# Patient Record
Sex: Female | Born: 1935 | Race: White | Hispanic: No | Smoking: Never smoker
Health system: Southern US, Community
[De-identification: ages and names within clinical notes are randomized; demographics above are authoritative.]

## PROBLEM LIST (undated history)

## (undated) DIAGNOSIS — K222 Esophageal obstruction: Secondary | ICD-10-CM

## (undated) DIAGNOSIS — K449 Diaphragmatic hernia without obstruction or gangrene: Secondary | ICD-10-CM

## (undated) DIAGNOSIS — I10 Essential (primary) hypertension: Secondary | ICD-10-CM

## (undated) DIAGNOSIS — M4850XA Collapsed vertebra, not elsewhere classified, site unspecified, initial encounter for fracture: Secondary | ICD-10-CM

## (undated) DIAGNOSIS — S72143A Displaced intertrochanteric fracture of unspecified femur, initial encounter for closed fracture: Secondary | ICD-10-CM

## (undated) DIAGNOSIS — E039 Hypothyroidism, unspecified: Secondary | ICD-10-CM

## (undated) DIAGNOSIS — R0602 Shortness of breath: Secondary | ICD-10-CM

## (undated) DIAGNOSIS — G629 Polyneuropathy, unspecified: Secondary | ICD-10-CM

## (undated) DIAGNOSIS — M1990 Unspecified osteoarthritis, unspecified site: Secondary | ICD-10-CM

## (undated) HISTORY — DX: Diaphragmatic hernia without obstruction or gangrene: K44.9

## (undated) HISTORY — PX: HIP FRACTURE SURGERY: SHX118

## (undated) HISTORY — DX: Esophageal obstruction: K22.2

## (undated) HISTORY — DX: Polyneuropathy, unspecified: G62.9

## (undated) HISTORY — PX: SHOULDER SURGERY: SHX246

## (undated) HISTORY — PX: COLONOSCOPY: SHX174

## (undated) HISTORY — DX: Hypothyroidism, unspecified: E03.9

---
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
## 1965-06-13 HISTORY — PX: CHOLECYSTECTOMY: SHX55

## 1975-06-14 HISTORY — PX: ABDOMINAL HYSTERECTOMY: SHX81

## 1984-06-13 HISTORY — PX: SPINE SURGERY: SHX786

## 1993-06-13 HISTORY — PX: ESOPHAGOGASTRODUODENOSCOPY: SHX1529

## 1993-06-13 HISTORY — PX: ERCP: SHX60

## 1997-06-13 HISTORY — PX: HEMORRHOID SURGERY: SHX153

## 2000-03-07 ENCOUNTER — Ambulatory Visit (HOSPITAL_COMMUNITY): Admission: RE | Admit: 2000-03-07 | Discharge: 2000-03-08 | Payer: Self-pay | Admitting: Orthopedic Surgery

## 2000-03-07 ENCOUNTER — Encounter: Payer: Self-pay | Admitting: Orthopedic Surgery

## 2000-09-30 ENCOUNTER — Ambulatory Visit (HOSPITAL_COMMUNITY): Admission: RE | Admit: 2000-09-30 | Discharge: 2000-09-30 | Payer: Self-pay | Admitting: Family Medicine

## 2000-09-30 ENCOUNTER — Encounter: Payer: Self-pay | Admitting: Family Medicine

## 2001-11-17 ENCOUNTER — Emergency Department (HOSPITAL_COMMUNITY): Admission: EM | Admit: 2001-11-17 | Discharge: 2001-11-17 | Payer: Self-pay | Admitting: *Deleted

## 2001-11-20 ENCOUNTER — Encounter: Payer: Self-pay | Admitting: Family Medicine

## 2001-11-20 ENCOUNTER — Ambulatory Visit (HOSPITAL_COMMUNITY): Admission: RE | Admit: 2001-11-20 | Discharge: 2001-11-20 | Payer: Self-pay | Admitting: Family Medicine

## 2001-11-30 ENCOUNTER — Ambulatory Visit (HOSPITAL_COMMUNITY): Admission: RE | Admit: 2001-11-30 | Discharge: 2001-11-30 | Payer: Self-pay | Admitting: Urology

## 2001-11-30 ENCOUNTER — Encounter: Payer: Self-pay | Admitting: Urology

## 2002-03-26 ENCOUNTER — Ambulatory Visit (HOSPITAL_COMMUNITY): Admission: RE | Admit: 2002-03-26 | Discharge: 2002-03-26 | Payer: Self-pay | Admitting: Family Medicine

## 2002-03-26 ENCOUNTER — Encounter: Payer: Self-pay | Admitting: Family Medicine

## 2002-11-21 ENCOUNTER — Ambulatory Visit (HOSPITAL_COMMUNITY): Admission: RE | Admit: 2002-11-21 | Discharge: 2002-11-21 | Payer: Self-pay | Admitting: Family Medicine

## 2002-11-21 ENCOUNTER — Encounter: Payer: Self-pay | Admitting: Family Medicine

## 2003-07-05 ENCOUNTER — Inpatient Hospital Stay (HOSPITAL_COMMUNITY): Admission: EM | Admit: 2003-07-05 | Discharge: 2003-07-08 | Payer: Self-pay | Admitting: Emergency Medicine

## 2004-05-07 ENCOUNTER — Ambulatory Visit (HOSPITAL_COMMUNITY): Admission: RE | Admit: 2004-05-07 | Discharge: 2004-05-07 | Payer: Self-pay | Admitting: Family Medicine

## 2004-07-26 ENCOUNTER — Ambulatory Visit (HOSPITAL_COMMUNITY): Admission: RE | Admit: 2004-07-26 | Discharge: 2004-07-26 | Payer: Self-pay | Admitting: Urology

## 2004-08-19 ENCOUNTER — Ambulatory Visit (HOSPITAL_COMMUNITY): Admission: RE | Admit: 2004-08-19 | Discharge: 2004-08-19 | Payer: Self-pay | Admitting: General Surgery

## 2004-09-09 ENCOUNTER — Ambulatory Visit (HOSPITAL_COMMUNITY): Admission: RE | Admit: 2004-09-09 | Discharge: 2004-09-09 | Payer: Self-pay | Admitting: General Surgery

## 2004-10-01 ENCOUNTER — Observation Stay (HOSPITAL_COMMUNITY): Admission: RE | Admit: 2004-10-01 | Discharge: 2004-10-02 | Payer: Self-pay | Admitting: Urology

## 2005-03-25 ENCOUNTER — Ambulatory Visit (HOSPITAL_COMMUNITY): Admission: RE | Admit: 2005-03-25 | Discharge: 2005-03-25 | Payer: Self-pay | Admitting: Urology

## 2006-01-30 ENCOUNTER — Ambulatory Visit (HOSPITAL_COMMUNITY): Admission: RE | Admit: 2006-01-30 | Discharge: 2006-01-30 | Payer: Self-pay | Admitting: Family Medicine

## 2006-02-01 ENCOUNTER — Ambulatory Visit (HOSPITAL_COMMUNITY): Admission: RE | Admit: 2006-02-01 | Discharge: 2006-02-01 | Payer: Self-pay | Admitting: Family Medicine

## 2006-06-09 ENCOUNTER — Encounter (INDEPENDENT_AMBULATORY_CARE_PROVIDER_SITE_OTHER): Payer: Self-pay | Admitting: Specialist

## 2006-06-09 ENCOUNTER — Observation Stay (HOSPITAL_COMMUNITY): Admission: RE | Admit: 2006-06-09 | Discharge: 2006-06-10 | Payer: Self-pay | Admitting: Urology

## 2007-01-30 ENCOUNTER — Ambulatory Visit (HOSPITAL_COMMUNITY): Admission: RE | Admit: 2007-01-30 | Discharge: 2007-01-30 | Payer: Self-pay | Admitting: Family Medicine

## 2007-08-01 ENCOUNTER — Ambulatory Visit (HOSPITAL_COMMUNITY): Admission: RE | Admit: 2007-08-01 | Discharge: 2007-08-01 | Payer: Self-pay | Admitting: Family Medicine

## 2007-09-17 ENCOUNTER — Ambulatory Visit (HOSPITAL_COMMUNITY): Admission: RE | Admit: 2007-09-17 | Discharge: 2007-09-17 | Payer: Self-pay | Admitting: Family Medicine

## 2007-09-18 ENCOUNTER — Ambulatory Visit (HOSPITAL_COMMUNITY): Admission: RE | Admit: 2007-09-18 | Discharge: 2007-09-18 | Payer: Self-pay | Admitting: Urology

## 2008-07-04 ENCOUNTER — Emergency Department (HOSPITAL_COMMUNITY): Admission: EM | Admit: 2008-07-04 | Discharge: 2008-07-04 | Payer: Self-pay | Admitting: Emergency Medicine

## 2008-09-11 HISTORY — PX: KNEE SURGERY: SHX244

## 2008-10-10 DIAGNOSIS — Z8719 Personal history of other diseases of the digestive system: Secondary | ICD-10-CM

## 2008-10-10 DIAGNOSIS — E039 Hypothyroidism, unspecified: Secondary | ICD-10-CM | POA: Insufficient documentation

## 2008-10-13 ENCOUNTER — Ambulatory Visit: Payer: Self-pay | Admitting: Internal Medicine

## 2008-10-14 ENCOUNTER — Encounter: Payer: Self-pay | Admitting: Internal Medicine

## 2008-10-21 DIAGNOSIS — R1084 Generalized abdominal pain: Secondary | ICD-10-CM | POA: Insufficient documentation

## 2008-10-21 DIAGNOSIS — R1319 Other dysphagia: Secondary | ICD-10-CM

## 2008-10-24 ENCOUNTER — Ambulatory Visit (HOSPITAL_COMMUNITY): Admission: RE | Admit: 2008-10-24 | Discharge: 2008-10-24 | Payer: Self-pay | Admitting: Internal Medicine

## 2008-10-24 ENCOUNTER — Ambulatory Visit: Payer: Self-pay | Admitting: Internal Medicine

## 2008-10-24 HISTORY — PX: COLONOSCOPY: SHX174

## 2008-10-24 HISTORY — PX: ESOPHAGOGASTRODUODENOSCOPY: SHX1529

## 2008-10-27 ENCOUNTER — Ambulatory Visit (HOSPITAL_COMMUNITY): Admission: RE | Admit: 2008-10-27 | Discharge: 2008-10-27 | Payer: Self-pay | Admitting: Internal Medicine

## 2008-10-28 ENCOUNTER — Encounter: Payer: Self-pay | Admitting: Internal Medicine

## 2008-12-01 ENCOUNTER — Ambulatory Visit (HOSPITAL_COMMUNITY): Admission: RE | Admit: 2008-12-01 | Discharge: 2008-12-01 | Payer: Self-pay | Admitting: Urology

## 2009-03-10 ENCOUNTER — Ambulatory Visit (HOSPITAL_COMMUNITY): Admission: RE | Admit: 2009-03-10 | Discharge: 2009-03-10 | Payer: Self-pay | Admitting: Urology

## 2009-11-20 ENCOUNTER — Emergency Department (HOSPITAL_COMMUNITY): Admission: EM | Admit: 2009-11-20 | Discharge: 2009-11-20 | Payer: Self-pay | Admitting: Emergency Medicine

## 2009-12-19 ENCOUNTER — Emergency Department (HOSPITAL_COMMUNITY): Admission: EM | Admit: 2009-12-19 | Discharge: 2009-12-19 | Payer: Self-pay | Admitting: Emergency Medicine

## 2010-06-23 ENCOUNTER — Emergency Department (HOSPITAL_COMMUNITY)
Admission: EM | Admit: 2010-06-23 | Discharge: 2010-06-23 | Payer: Self-pay | Source: Home / Self Care | Admitting: Emergency Medicine

## 2010-08-30 LAB — DIFFERENTIAL
Basophils Absolute: 0 10*3/uL (ref 0.0–0.1)
Eosinophils Relative: 1 % (ref 0–5)
Lymphocytes Relative: 17 % (ref 12–46)
Neutro Abs: 4.8 10*3/uL (ref 1.7–7.7)
Neutrophils Relative %: 78 % — ABNORMAL HIGH (ref 43–77)

## 2010-08-30 LAB — COMPREHENSIVE METABOLIC PANEL
BUN: 12 mg/dL (ref 6–23)
CO2: 27 mEq/L (ref 19–32)
Chloride: 105 mEq/L (ref 96–112)
Creatinine, Ser: 0.74 mg/dL (ref 0.4–1.2)
GFR calc non Af Amer: >60 mL/min (ref >60)
Glucose, Bld: 153 mg/dL — ABNORMAL HIGH (ref 70–99)
Total Bilirubin: 0.7 mg/dL (ref 0.3–1.2)

## 2010-08-30 LAB — CBC
HCT: 37.8 % (ref 36.0–46.0)
MCHC: 33.7 g/dL (ref 30.0–36.0)
MCV: 87.3 fL (ref 78.0–100.0)
RBC: 4.32 MIL/uL (ref 3.87–5.11)
WBC: 6.2 10*3/uL (ref 4.0–10.5)

## 2010-08-30 LAB — URINALYSIS, ROUTINE W REFLEX MICROSCOPIC
Glucose, UA: NEGATIVE mg/dL
Hgb urine dipstick: NEGATIVE
Ketones, ur: NEGATIVE mg/dL
Protein, ur: NEGATIVE mg/dL
Urobilinogen, UA: 0.2 mg/dL (ref 0.0–1.0)

## 2010-08-30 LAB — LIPASE, BLOOD: Lipase: 21 U/L (ref 11–59)

## 2010-09-27 LAB — URINALYSIS, ROUTINE W REFLEX MICROSCOPIC
Bilirubin Urine: NEGATIVE
Nitrite: NEGATIVE
Specific Gravity, Urine: 1.02 (ref 1.005–1.030)
Urobilinogen, UA: 0.2 mg/dL (ref 0.0–1.0)

## 2010-09-27 LAB — COMPREHENSIVE METABOLIC PANEL
ALT: 24 U/L (ref 0–35)
AST: 35 U/L (ref 0–37)
CO2: 25 mEq/L (ref 19–32)
Calcium: 9 mg/dL (ref 8.4–10.5)
GFR calc Af Amer: >60 mL/min (ref >60)
Sodium: 139 mEq/L (ref 135–145)
Total Protein: 6.3 g/dL (ref 6.0–8.3)

## 2010-09-27 LAB — DIFFERENTIAL
Eosinophils Absolute: 0 10*3/uL (ref 0.0–0.7)
Eosinophils Relative: 0 % (ref 0–5)
Lymphs Abs: 0.3 10*3/uL — ABNORMAL LOW (ref 0.7–4.0)
Monocytes Absolute: 0.3 10*3/uL (ref 0.1–1.0)
Monocytes Relative: 3 % (ref 3–12)

## 2010-09-27 LAB — CBC
MCHC: 33.9 g/dL (ref 30.0–36.0)
RBC: 4.57 MIL/uL (ref 3.87–5.11)
RDW: 13.6 % (ref 11.5–15.5)

## 2010-10-26 NOTE — Op Note (Signed)
NAMEARMANDA, [name]                   ACCOUNT NO.:  [id number]   MEDICAL RECORD NO.:  [id number]          PATIENT TYPE:  AMB   LOCATION:  DAY                           FACILITY:  [facility]   PHYSICIAN:  R. Roetta Sessions, M.D. DATE OF BIRTH:  1921-05-19   DATE OF PROCEDURE:  DATE OF DISCHARGE:                               OPERATIVE REPORT   EGD with Elease Hashimoto dilation, followed by colonoscopy surveillance.   INDICATIONS FOR PROCEDURE:  This is an 77 year old lady seeing [name] with  recurrent esophageal dysphagia.  She had a history of similar symptoms  previously.  EGD back in 1995 with Maloney dilation was associated with  marked improvement in dysphagia symptoms.  She also has bilateral lower  quadrant abdominal pain; has difficulty having a bowel movement.  History of colonic polyps.  EGD with dilation as appropriate, followed  by colonoscopy, now being performed.  Risks, benefits, alternatives and  limitations reviewed with her and her son at length.  Please see the  documentation in the medical record.   PROCEDURE NOTE:  O2 saturation, blood pressure, pulse and respirations  were monitored  throughout the entire procedure.  Conscious sedation:  Versed 3 mg IV, Demerol 75 mg IV in divided doses.  Cetacaine spray for  topical pharyngeal anesthesia.   FINDINGS:  Examination of the tubular esophagus revealed no structural  abnormality.  Mucosa appeared normal.  EG junction easily traversed.   Stomach:  Gastric cavity was emptied and insufflated well with air.  Thorough examination of the gastric mucosa, including retroflexion of  the proximal stomach and esophagogastric junction, demonstrated only a  small hiatal hernia and deformity of the antrum/pyloric channel.  The  mucosal surfaces of the stomach otherwise appeared entirely normal.  Pylorus was easily traversed.  Examination of the bulb and second  portion revealed no abnormalities.  Therapeutic/diagnostic maneuvers  performed.   The  scope was withdrawn.  A 56-French Maloney dilator was passed to full  insertion with ease.  A look back revealed no apparent complication  related to passage of the dilator.  The patient tolerated the procedure  well.   COLONOSCOPY:  Digital rectal examination revealed mild anal stenosis.   ENDOSCOPIC FINDINGS:  The prep was adequate.   Colon:  Colonic mucosa was surveyed from the rectosigmoid junction  through to the left transverse right colon, the appendiceal orifice,  ileocecal valve and cecum.  These structures were well seen and  photographed for the record.  From this level, the scope was slowly and  cautiously withdrawn.  All previous mentioned mucosal surfaces were  again seen.  The patient had pancolonic diverticula.  The remainder of  the colonic mucosa appeared normal.  The scope was pulled down in the  rectum.  A thorough examination of the rectal mucosa, including  retroflexed view of the anal verge, demonstrated only anal papilla.   The patient tolerated both procedures well.  She was reacted in  Endoscopy.  Cecal withdrawal time 6+ minutes.   IMPRESSION:  1. Normal esophagus, status post passage of a Maloney dilator.  2. Small  hiatal hernia deformity of the antrum/pylorus; otherwise,      normal-appearing gastric mucosa, normal D1 and D2.  3. Colonoscopy findings:  Anal papilla; otherwise normal rectum,      pancolonic diverticulum and colonic mucosa.   She has had bilateral lower quadrant pain, which may be more related to  constipation than anything else.  I feel we ought to do a limited for  further evaluation in the way of a CT scan of the abdomen and pelvis.  I  will plan to set this up in the near future.  Hopefully dysphagia  symptoms will improve over the long haul.  I do not feel that [name]  needs any further polyp surveillance via colonoscopy at her age, and  given today's negative findings.  Ultimately, she may well need just to  simply go on a more  aggressive bowel regimen; but we will review the CT  scan and make further recommendations in the very near future.      [name], M.D.  Electronically Signed     RMR/MEDQ  D:  10/24/2008  T:  10/24/2008  Job:  [id number]   cc:   Angus G. Renard Matter, MD  Fax: [phone]

## 2010-10-29 NOTE — H&P (Signed)
[name], [name]                   ACCOUNT NO.:  [id number]   MEDICAL RECORD NO.:  [id number]           PATIENT TYPE:  AMB   LOCATION:                                FACILITY:  [facility]   PHYSICIAN:  Ky Barban, M.D.DATE OF BIRTH:  May 28, 1921   DATE OF ADMISSION:  DATE OF DISCHARGE:  LH                              HISTORY & PHYSICAL   CHIEF COMPLAINT:  Difficulty to void and hesitancy.  An 77 year old  female a year and a half ago underwent tension free vaginal tape.  She  does not have any stress incontinence, but started to develop  difficulties voiding, hesitancy.  I have gone back and removed part of  the tape, which was on her urethra, hoping that it will resolve her  problem.  She did okay for a while, but she continues to have difficulty  to void and urethral dilations have not helped so I have suggested  either leave it alone or we can go back and do a urethral lysis.  Hopefully that will help the problem.  No guarantees about the result.  I will see if I can remove the rest of the tape.  The possibility of  recurrent stress incontinence is there.  She also has urgency, urge  incontinence.  Her main complaint is difficulty to void and hesitancy.   PAST MEDICAL HISTORY:  She is in very good health for her age.  Otherwise, no significant medical problems.   SOCIAL HISTORY:  Does not smoke or drink.   REVIEW OF SYSTEMS:  Unremarkable.   PHYSICAL EXAMINATION:  Well-nourished, well-developed female not in any  acute distress.  Her blood pressure is 150/80.  Temperature is normal.  CENTRAL NERVOUS SYSTEM:  No gross neurological deficit.  HEAD, NECK, ENT:  Negative.  CHEST:  Negative.  HEART:  Regular sinus rhythm.  No murmur.  ABDOMEN:  Soft, flat.  Liver, spleen, kidneys are not palpable.  PELVIC EXAM:  No adnexal mass or tenderness.   IMPRESSION:  Possible urethral obstruction from adhesions.   PLAN:  Urethral lysis.  Procedure, possible complications, and  possibility of recurrent stress incontinence discussed in detail.      Ky Barban, M.D.  Electronically Signed    MIJ/MEDQ  D:  06/08/2006  T:  06/08/2006  Job:  [id number]

## 2010-10-29 NOTE — H&P (Signed)
[name], [name]                   ACCOUNT NO.:  [id number]   MEDICAL RECORD NO.:  [id number]          PATIENT TYPE:  AMB   LOCATION:                                 FACILITY:   PHYSICIAN:  Jerolyn Shin C. Katrinka Blazing, M.D.   DATE OF BIRTH:  July 02, 1920   DATE OF ADMISSION:  DATE OF DISCHARGE:  LH                                HISTORY & PHYSICAL   HISTORY OF PRESENT ILLNESS:  A 77 year old female with history of increasing  difficulty with bowel movements.  She has increased bloating.  She has not  had nausea or vomiting.  She has not had difficulty with blood in her  bowels.  She has had very narrow stools with difficulty evacuating her  rectum.  She has also had diarrhea.  She has had crampy abdominal pain with  a 17 pound weight loss.  The patient has some indigestion and dysphagia.  CT  of the abdomen and pelvis were normal.   PAST HISTORY:  1.  She has hypothyroidism.  2.  Osteoarthritis.   ALLERGIES:  No known drug allergies.   MEDICATIONS:  1.  Neurontin 300 mg q.h.s.  2.  Synthroid 100 mcg daily.   SURGERY:  1.  Appendectomy.  2.  Cholecystectomy.  3.  Total abdominal hysterectomy.  4.  Knee arthroscopy.  5.  Hemorrhoidectomy.  6.  Removal of a growth of her lumbar spine.   REVIEW OF SYSTEMS:  No evidence of heart or lung disease or renal disorder.   PHYSICAL EXAMINATION:  VITAL SIGNS:  Blood pressure 130/90, pulse 80,  respirations 20, weight 195 pounds.  HEENT:  Unremarkable, though the patient has extremely poor hearing.  NECK:  Supple without JVD or bruit.  CHEST:  Clear to auscultation.  HEART:  Regular rate and rhythm without murmur, gallop, or rub.  ABDOMEN:  Multiple healed surgical incisions.  The abdomen is distended.  There is no tenderness.  She has normoactive bowel sounds.  EXTREMITIES:  No clubbing, cyanosis, or edema.  RECTAL:  Mild anal stenosis, but able to insert middle finger without  difficulty.  She has no palpable anal masses.  Stool is guaiac  negative.  NEUROLOGIC:  No focal motor, sensory, or cerebellar deficit.   IMPRESSION:  1.  Chronic constipation.  2.  Change in bowel habits.  3.  Mild anal stenosis.  4.  Osteoarthritis.  5.  Hypothyroidism.   PLAN:  The patient will undergo colonoscopy.      LCS/MEDQ  D:  08/19/2004  T:  08/19/2004  Job:  [id number]

## 2010-10-29 NOTE — H&P (Signed)
NAMEMORA, PEDRAZA                   ACCOUNT NO.:  [id number]   MEDICAL RECORD NO.:  [id number]          PATIENT TYPE:  AMB   LOCATION:  DAY                           FACILITY:  [facility]   PHYSICIAN:  Ky Barban, M.D.DATE OF BIRTH:  Dec 28, 1920   DATE OF ADMISSION:  DATE OF DISCHARGE:  LH                                HISTORY & PHYSICAL   CHIEF COMPLAINT:  Stress urinary incontinence.   HISTORY:  An 76 year old female who has been followed by me for a long time,  has urinary incontinence, recurrent cystitis, is found to have mild stress  incontinence.  I have advised her to go tension-free vaginal tape and she is  coming as outpatient.  Will have the procedure.  I have discussed the  procedure, limitations, complications, no guarantees about the results and  in case she goes into retention we may have to remove the tape.  She  understands, wanted me to proceed.   PAST MEDICAL HISTORY:  1.  About three years ago she had right ureteral stone basket done.  2.  Recently she had urinary tract infection, was treated for that.  No      other significant medical problem.   REVIEW OF SYSTEMS:  Unremarkable.   PHYSICAL EXAMINATION:  GENERAL:  Morbidly obese, but not in acute distress.  VITAL SIGNS:  Blood pressure 150/80, temperature normal.  CENTRAL NERVOUS SYSTEM: Negative.  HEENT:  Negative.  CHEST:  Symmetrical.  HEART:  Regular sinus rhythm.  No murmur.  ABDOMEN:  Soft, flat.  Liver, spleen, kidneys are not palpable.  No CVA  tenderness.  PELVIC:  No adnexa mass or tenderness.   IMPRESSION:  Stress urinary incontinence.   PLAN:  Tension-free vaginal tape under anesthesia as outpatient.      MIJ/MEDQ  D:  09/30/2004  T:  09/30/2004  Job:  [id number]

## 2010-10-29 NOTE — Procedures (Signed)
[name], [name]                   ACCOUNT NO.:  [id number]   MEDICAL RECORD NO.:  [id number]          PATIENT TYPE:  AMB   LOCATION:                                FACILITY:  [facility]   PHYSICIAN:  Edward L. Juanetta Gosling, M.D.DATE OF BIRTH:  Nov 08, 1920   DATE OF PROCEDURE:  DATE OF DISCHARGE:                                EKG INTERPRETATION   Time is 10:55, September 29, 2004. Rhythm is sinus rhythm with a rate in the  50s. There are PACs. First degree AV block is seen. There is left axis  deviation. Q waves are seen inferiorly which may indicate a previous  inferior myocardial infarction. There is slow R-wave progression across the  precordium which may indicate previous anterior myocardial infarction.  Abnormal electrocardiogram.      ELH/MEDQ  D:  09/30/2004  T:  10/01/2004  Job:  [id number]

## 2010-10-29 NOTE — H&P (Signed)
[name]                   ACCOUNT NO.:  [id number]   MEDICAL RECORD NO.:  [id number]           PATIENT TYPE:  AMB   LOCATION:                                FACILITY:  [facility]   PHYSICIAN:  Ky Barban, M.D.DATE OF BIRTH:  1921/04/10   DATE OF ADMISSION:  DATE OF DISCHARGE:  LH                                HISTORY & PHYSICAL   CHIEF COMPLAINT:  Difficulty to void.   HISTORY OF PRESENT ILLNESS:  An 77 year old female.  Several months ago I  had __________  tape.  She was having considerable stress incontinence.  She  got rid of the stress incontinence.  Now she has difficulty voiding.  Had  several urinary tract infections. She is able to void but she has to strain  to urinate and keeps having cystitis.  So I suggested that we need to go  ahead and relieve this tape and hopefully that will take care of this  problem but I am made aware that she has now developed stress incontinence  again.  She does not like that but we do not have any choice.  Otherwise she  keeps having these infections.   PAST MEDICAL HISTORY:  1.  As I mentioned, DVT six months ago.  2.  She has right ureteral stone basket done in the past.  3.  She does not smoke or drink.   REVIEW OF SYSTEMS:  Denies any chest pain, orthopnea, PND, nausea, vomiting.   PHYSICAL EXAMINATION:  VITAL SIGNS:  Blood pressure 120/80, temperature  normal.  CENTRAL NERVOUS SYSTEM:  Negative.  HEAD AND NECK:  Negative.  CHEST:  Symmetrical.  HEART:  Regular sinus rhythm.  ABDOMEN:  Soft, flat.  Liver, spleen, and kidneys not palpable.  No CVA  tenderness.  PELVIC:  No adnexal mass or tenderness.   IMPRESSION:  Difficulty to void.   PLAN:  Delays of tension of prevaginal tape.      Ky Barban, M.D.  Electronically Signed     MIJ/MEDQ  D:  03/24/2005  T:  03/25/2005  Job:  [id number]

## 2010-10-29 NOTE — Group Therapy Note (Signed)
[name], [name]                             ACCOUNT NO.:  [id number]   MEDICAL RECORD NO.:  [id number]                   PATIENT TYPE:  INP   LOCATION:  IC07                                 FACILITY:  [facility]   PHYSICIAN:  Angus G. Renard Matter, M.D.              DATE OF BIRTH:  [birth year]   DATE OF PROCEDURE:  DATE OF DISCHARGE:                                   PROGRESS NOTE   This patient remains on nitroglycerin drip; is relatively pain free.  Apparently cardiac enzymes have been normal.  She is admitted with chest  pain.  She does have history of hypothyroidism.   OBJECTIVE:  VITAL SIGNS: Blood pressure 130/66, respirations 59, temperature  97.  LUNGS:  Clear to P&A.  HEART:  Regular rhythm.  ABDOMEN:  No palpable  organs or masses.   ASSESSMENT:  Patient was admitted with chest pain, has hypothyroidism.   PLAN:  Plan to have [facility] see the patient for further  evaluation today.      ___________________________________________                                            Ishmael Holter. Renard Matter, M.D.   AGM/MEDQ  D:  07/07/2003  T:  07/07/2003  Job:  [id number]

## 2010-10-29 NOTE — H&P (Signed)
[facility]  Patient:    [name], [name] Visit Number: [id number] MRN: [id number]          Service Type: DSU Location: DAY Attending Physician:  Alleen Borne I Dictated by:   Alleen Borne, M.D. Admit Date:  11/30/2001                           History and Physical  CHIEF COMPLAINT:  Recurrent left flank pain.  HISTORY OF PRESENT ILLNESS:  This 77 year old female came to see me on November 22, 2001 with a several day history of having left flank pain and also having difficulty voiding with urge to urinate.  She had a CT scan done that shows a 7 mm stone in the left ureterovesical junction causing partial obstruction. The patient is quite miserable.  I told her there is a slight possibility she may pass the stone and otherwise we will have to go ahead and do a stone basket.  I recommended that she undergo stone basket in case it does not come out instead of having lithotripsy ESL.  She denies any fever, chills, or gross hematuria.  No history of any kidney stones.  PAST MEDICAL HISTORY:  1. Cholecystectomy in 1967.  2. Back surgery in 1985.  3. Hysterectomy in 1974.  MEDICATIONS:  1. Synthroid.  2. Neurontin.  3. Vicodin.  4. Levaquin.  ALLERGIES:  1. PENICILLIN.  2. DEMEROL.  FAMILY HISTORY:  One of her sons has kidney stones.  SOCIAL HISTORY:  She does not smoke or drink.  REVIEW OF SYSTEMS:  Unremarkable.  PHYSICAL EXAMINATION:  GENERAL:  Moderately built, fully conscious, alert and oriented female in moderate distress.  VITAL SIGNS:  Blood pressure 130/80, temperature normal.  CENTRAL NERVOUS SYSTEM:  No gross neurological deficit.  HEENT:  Negative.  CHEST:  Equal.  HEART:  Regular, sinus rhythm.  ABDOMEN:  Soft, flat.  Liver, spleen, and kidneys not palpable.  Left CVA tenderness.  PELVIC:  Unremarkable.  IMPRESSION:  Left ureteral calculus.  PLAN:  Cystoscopy, left retrograde pyelogram, ureteroscopic stone basket  with use of holmium laser for lithotripsy and use of Double J stent.  The procedure, risks and complications were discussed in detail with the patient and her grandson.  I told her there is no guarantee I will be able to get the stone out and during the procedure if the ureter gets perforated then I will have to operate on her with an incision and fix the ureter.  She understands and wants me to go ahead and proceed with it. Dictated by:   Alleen Borne, M.D. Attending Physician:  Alleen Borne I DD:  11/30/01 TD:  11/30/01 Job: [id number]

## 2010-10-29 NOTE — Consult Note (Signed)
[name], [name]                             ACCOUNT NO.:  [id number]   MEDICAL RECORD NO.:  [id number]                   PATIENT TYPE:  INP   LOCATION:  IC07                                 FACILITY:  [facility]   PHYSICIAN:  [place] Bing, M.D.               DATE OF BIRTH:  [birth year]   DATE OF CONSULTATION:  07/07/2003  DATE OF DISCHARGE:                                   CONSULTATION   HPI:  [age]-year-old woman admitted to hospital after awakening with chest  discomfort.  [name] describes the sudden onset a feeling as if she was hit  in her mid sternal region with radiation to the shoulders and neck.  The  pain was moderately severe.  It had a sharp and throbbing quality.  There  was no prominent associated dyspnea nor diaphoresis.  There is a question of  transient loss of consciousness.  She received three successive sublingual  nitroglycerin in the emergency department, ultimately with improvement.  She  was initially treated with intravenous nitroglycerin.  When this was weaned,  chest discomfort recurred.   [name] presented with somewhat different discomfort in 1996 that was  epigastric in location.  A stress Cardiolite study was abnormal, prompting  cardiac catheterization, which was unremarkable.   PAST MEDICAL HISTORY:  Is otherwise notable for at history of:  1. Removal of an esophageal mass.  2. Hypothyroidism.  3. Obesity.  4. GERD.   MEDICATIONS PRIOR TO ADMISSION:  Included only:  1. Neurontin.  2. Levothyroxine.   SOCIAL HISTORY:  Lives in [place] with her son, who has multiple  sclerosis.  Retired from work as a Neurosurgeon.  Widowed for 14 years.  No  use of tobacco products nor alcohol.  Lifestyle is sedentary - there is no  dyspnea on mild exertion.   FAMILY HISTORY:  Mother died at an advanced age; father died in his 43s due  to myocardial infarction.  One of five brothers has suffered a myocardial  infarction; one of four sisters has undergone  CABG surgery.   REVIEW OF SYSTEMS:  Recent URI symptoms; intermittent cough; episodes of  heartburn in the past; all other systems were reviewed and are negative.   ALLERGIES:  REPORTS ALLERGY TO PENICILLIN - DEVELOPED A RASH IN THE PAST.   PHYSICAL EXAMINATION:  Pleasant older woman, with excessive weight and  pallor.  The temperature is 97.3, heart rate 80 and regular, respirations  16, blood pressure 115/80, weight 210.  O2 saturation 96% on room air.  HEENT:  Anicteric sclera.  NECK:  No jugular venous distention; no carotid bruits.  ENDOCRINE:  No thyromegaly.  HEMATOPOIETIC:  No adenopathy.  SKIN:  Warm and dry.  CHEST:  Decreased breath sounds throughout; no rales.  CARDIAC:  Normal first and second heart sounds; fourth heart sound present.  ABDOMEN:  Soft and nontender; no organomegaly; normal bowel sounds.  EXTREMITIES:  Trace edema; distal pulses intact.  NEUROMUSCULAR:  Symmetric strength and tone.  MUSCULOSKELETAL:  Full range of motion in all joints.   Chest x-ray:  Technically limited due to inadequate inspiration; no acute  disease identified.   CT of the Chest:  No pulmonary embolism.   EKG:  Sinus bradycardia; first degree AV block; LAFB vs. prior inferior  myocardial infarction; abnormal R-wave progression, probably partially  related to lead placement.   Other laboratory notable for normal CBC, normal chemistry profile and normal  cardiac markers.   IMPRESSION:  [name] presents with relatively modest cardiovascular risk  factors but with a significant risk of coronary disease due to her age,  impressive chest discomfort but negative EKG and markers during symptoms.  Her level of risk renders a pharmacologic stress Cardiolite study and  appropriate first tests.  If negative, she can be discharged with clinical  follow-up.  The questionable history of syncope is of additional concern,  but difficult to evaluate based upon the available history.  At present,  no  further testing is specifically planned to investigate the possibility of  transient loss of consciousness.  Any further testing will be based upon the  results of her adenosine Cardiolite study.      ___________________________________________                                            [place] Bing, M.D.   RR/MEDQ  D:  07/07/2003  T:  07/08/2003  Job:  [id number]

## 2010-10-29 NOTE — Procedures (Signed)
NAMESARABI, [name]                   ACCOUNT NO.:  [id number]   MEDICAL RECORD NO.:  [id number]          PATIENT TYPE:  AMB   LOCATION:  DAY                           FACILITY:  [facility]   PHYSICIAN:  Edward L. Juanetta Gosling, M.D.DATE OF BIRTH:  [birth year]   DATE OF PROCEDURE:  06/07/2006  DATE OF DISCHARGE:                              EKG INTERPRETATION   1. The rhythm is sinus, with rate in the 50s.  2. There is first-degree AV block.  3. There is left axis deviation.  4. Slow R wave progression across the precordium may indicate a      previous anterior infarction.  5. There are Q waves inferiorly, which could indicate a previous      inferior infarction, and clinical correlation is suggested for both      of these.  Abnormal electrocardiogram.      Oneal Deputy. Juanetta Gosling, M.D.  Electronically Signed     ELH/MEDQ  D:  06/08/2006  T:  06/09/2006  Job:  [id number]

## 2010-10-29 NOTE — Op Note (Signed)
[name], [name]                   ACCOUNT NO.:  [id number]   MEDICAL RECORD NO.:  [id number]          PATIENT TYPE:  AMB   LOCATION:  DAY                           FACILITY:  [facility]   PHYSICIAN:  Ky Barban, M.D.DATE OF BIRTH:  19-Sep-1920   DATE OF PROCEDURE:  03/25/2005  DATE OF DISCHARGE:                                 OPERATIVE REPORT   PREOPERATIVE DIAGNOSIS:  Recurrent cystitis.   POSTOPERATIVE DIAGNOSIS:  Recurrent cystitis.   PROCEDURE:  Exploration of tension-free vaginal tape and release.   ANESTHESIA:  Spinal.   INDICATIONS:  This patient had tension-free vaginal tape done several months  ago, and she is having recurrent cystitis and having some hesitancy, and I  decided to go ahead and release this tension-free vaginal tape and see if it  can take care of the UTIs. I have warned the patient that she might develop  stress incontinence.   PROCEDURE:  The patient was placed in lithotomy position, usual prep and  drape. Vaginal speculum was applied. A #18 Foley catheter inserted. The base  of the urethra was infiltrated with 10 cc of normal saline and longitudinal  incision about 1 cm long right over the mid urethra is made, and with very  minimal dissection on either side of the periurethral area, right angle is  passed around the vaginal tape, and it was simply cut on top of the urethra.  I can see the tape has receded into the retropubic space, but apparently it  was on tension. The wound was irrigated with saline, and with a couple of  stitches in the paraurethral area was approximated. The vaginal mucosa was  closed with interrupted sutures of 3-0 Vicryl. There was no bleeding, and  the patient left the operating room in satisfactory condition. I left the  Foley catheter in for now.      Ky Barban, M.D.  Electronically Signed     MIJ/MEDQ  D:  03/25/2005  T:  03/25/2005  Job:  [id number]

## 2010-10-29 NOTE — Op Note (Signed)
NAMEMELISS, [name]                   ACCOUNT NO.:  [id number]   MEDICAL RECORD NO.:  [id number]          PATIENT TYPE:  AMB   LOCATION:  DAY                           FACILITY:  [facility]   PHYSICIAN:  Ky Barban, M.D.DATE OF BIRTH:  December 24, 1920   DATE OF PROCEDURE:  10/01/2004  DATE OF DISCHARGE:                                 OPERATIVE REPORT   PREOPERATIVE DIAGNOSIS:  Stress urinary incontinence.   POSTOPERATIVE DIAGNOSIS:  Stress urinary incontinence.   PROCEDURE:  Tension-free vaginal tape.   ANESTHESIA:  Spinal.   PROCEDURE:  The patient was given spinal anesthesia, placed in lithotomy  position, adequately prepped and draped. A #20 Foley catheter inserted into  the bladder. Suprapubic site was marked at the level of the pubic tubercle.  Now, the mid urethra was infiltrated with 10 mL of normal saline. An  incision about 1 cm long is made over the mid urethra. The periurethral area  is developed, lifting the vaginal mucosa off the periurethral tissue on both  sides of the urethra. Now I am ready to insert the trocar. Now the catheter  is removed, and the Foley catheter with trocar is  inserted, deflecting the  bladder neck to the right side. Introduced the trocar around the left side  of the mid urethra, and feeling the pubic bone, I stayed in the retropubic  space, just close to the pubic symphysis, came out at the marked site in the  suprapubic area. Catheter was removed from the bladder. Bladder filled up  with 300 mL of saline, inspected with right-angle lens. The trocar is  outside of the bladder. Now the trocar is pulled into the suprapubic area  after emptying the bladder. The blue cord is pulled along with it, and then  a length of the blue cord is left in place, and Hemostats were placed on  both ends. The second pass of the trocar is on the right side of the mid  urethra in the same exact fashion. Blue cord is pulled  down that side also.  Now the vaginal tape is  attached to the vaginal side of the blue cords on  both sides, and the tape is pulled in the suprapubic area. The tape is  positioned over the mid urethra. The plastic covering the tape is separated  in the midline. The tension on the tape is adjusted. It is left loose over  the mid urethra, and the plastic covering is removed. The tape is lined  gently over the mid urethra. Now, the vaginal wound is irrigated with  saline, and the vaginal incision is closed with interrupted sutures of 3-0  Vicryl. The tape in the suprapubic area is cut flush with the skin, the  bladder is emptied, and the patient left the operating room in stable  condition.      MIJ/MEDQ  D:  10/01/2004  T:  10/01/2004  Job:  [id number]

## 2010-10-29 NOTE — Group Therapy Note (Signed)
[name], REDDIX                             ACCOUNT NO.:  [id number]   MEDICAL RECORD NO.:  [id number]                   PATIENT TYPE:  INP   LOCATION:  IC07                                 FACILITY:  [facility]   PHYSICIAN:  Angus G. Renard Matter, M.D.              DATE OF BIRTH:  11-26-1920   DATE OF PROCEDURE:  07/08/2003  DATE OF DISCHARGE:                                   PROGRESS NOTE   SUBJECTIVE:  This patient was admitted with anterior chest pain.  She  remains stable.  She has been evaluated by cardiology and stress test has  been ordered for today.  The patient had CT of the chest which did not show  evidence of pulmonary embolus.  A 1.7 cm left thyroid nodule was noted.   OBJECTIVE:  Vital signs:  Blood pressure 137/103, respirations 16, pulse 58,  temperature 97.3.  Lungs:  Clear to P&A.  Heart:  Regular rhythm.  Abdomen:  No palpable organs or masses.   ASSESSMENT:  The patient was admitted with anterior chest pain to rule out  myocardial infarction.   PLAN:  Plan to proceed with adenosine Cardiolite stress test today.      ___________________________________________                                            Ishmael Holter. Renard Matter, M.D.   AGM/MEDQ  D:  07/08/2003  T:  07/08/2003  Job:  [id number]

## 2010-10-29 NOTE — Op Note (Signed)
NAMEKATHA, KUEHNE                   ACCOUNT NO.:  [id number]   MEDICAL RECORD NO.:  [id number]          PATIENT TYPE:  OBV   LOCATION:  A328                          FACILITY:  [facility]   PHYSICIAN:  Ky Barban, M.D.DATE OF BIRTH:  10-Jul-1920   DATE OF PROCEDURE:  06/09/2006  DATE OF DISCHARGE:                               OPERATIVE REPORT   PREOPERATIVE DIAGNOSIS:  Difficulty voiding, possible periurethral  adhesions.   POSTOPERATIVE DIAGNOSIS:  Difficulty voiding, possible periurethral  adhesions.   PROCEDURE:  Urethrolysis and removal of part of the vaginal tape on the  left side.   ANESTHESIA:  General endotracheal.   DESCRIPTION OF PROCEDURE:  With the patient under general endotracheal  anesthesia and after the usual prep and draped, the vagina was  inspected.  Because of her age, the vagina is very tight and a #20 Foley  catheter inserted into the bladder and with traction on the Foley  catheter, I was able to look at the previous incision for which she had  vaginal tape done.  It should be mentioned that I initially did a  tension free vaginal tape then part of the tape which was over the  urethra was removed because she was in urinary retention.  She still has  difficulty voiding and she is brought in to do the urethrolysis.  I  could see the previously placed incision on the vagina over the mid  urethra is quite a bit scarred and irregular.  A transverse incision  about 1 inch long was made right over the scar and the vaginal mucosa  was separated from the urethra, and at that point with blunt and sharp  dissection, I was able to see this vaginal end of the tape on the left  side.  I remember from previous surgery that most of the tape I had  removed was from the right side, so I applied a clamp at this tape and  about an inch of this piece of tissue which appears to be tape was  removed and I was able to see that it was stuck in scar tissue right at  that point  which was causing compression of the distal urethra.  This  may be the reason she was having difficulty to the void.  Once I was  able to do that, then with blunt and sharp dissection, both sides of the  urethra and distal half of the urethra was separated from the tissues.  Hopefully if there is any scar tissue in this area it was broken.  I did  not want to do more aggressive lysis with the fear that we may perforate  the urethra.  This area was irrigated.  There was no bleeding going on  and I gently approximated the vaginal mucosa with interrupted sutures of  3-0 Vicryl.  The vagina is packed with Betadine soaked gauze.  The Foley  catheter was left in. The patient left the operating room in  satisfactory condition.      Ky Barban, M.D.  Electronically Signed  MIJ/MEDQ  D:  06/09/2006  T:  06/09/2006  Job:  [id number]

## 2010-10-29 NOTE — H&P (Signed)
[name], [name]                             ACCOUNT NO.:  [id number]   MEDICAL RECORD NO.:  [id number]                   PATIENT TYPE:  INP   LOCATION:  IC07                                 FACILITY:  [facility]   PHYSICIAN:  Mila Homer. Sudie Bailey, M.D.           DATE OF BIRTH:  1921/03/25   DATE OF ADMISSION:  07/04/2003  DATE OF DISCHARGE:                                HISTORY & PHYSICAL   HISTORY OF PRESENT ILLNESS:  The patient is an 77 year old woman who went to  sleep last night.  She was awoke from sleep with a sharp, epigastric pain.  It was more severe pain that she had ever had in our life although she had  similar pain two to three years ago.  She presented to the [facility]  emergency room.   She has had a couple of bouts of similar pain as noted above.  She had  cardiac catheterization twice at [facility], although we do not  have those records.  In the emergency room, the pain cleared with  nitroglycerin.  She notes that she has exercise intolerance, shortness of  breath and fatigue with walking on her property, mopping floors, etc. in the  last six months.  She is a patient of Dr. Ishmael Holter. McInnis.   CURRENT MEDICATIONS:  1. Neurontin b.i.d.  2. Synthroid.   She also mentions that some years ago, she had a growth removed from her  esophagus.  She had heartburn some three years prior to that.  She further  states that her pain has been 10/10 and is worsened by taking deep breaths.   PHYSICAL EXAMINATION:  GENERAL:  Admission exam in the emergency room showed  a pleasant, elderly woman who had severe pain, __________in the epigastrium.  It was in the neck, and she was nauseated.  She is short of breath.  By the  time of my exam, she is oriented, alert, in no acute distress and pain free  in the unit on nitroglycerin drip.  Color was good.  VITAL SIGNS:  Blood pressure 151/22, heart rate 55.  Respiratory rate 20, O2  saturations on two liters was 100%.  HEENT:   Mucous membranes appeared moist.  Skin turgor is normal.  No  axillary or supraclavicular adenopathy.  HEART:  An obviously regular rhythm without murmur, rate of 70.  LUNGS:  Clear, moving air well.  ABDOMEN:  Soft, without hepatosplenomegaly or mass.  EXTREMITIES:  There is 1+ edema of the ankles.   LABORATORY DATA:  She had an essentially normal CBC and MET7 showed a  glucose of 124, and SGOT of 91.  Cardiac enzymes are negative including a CK  of 117, CK-MB of 3.8, and troponin of 0.01.   EKG:  Showed a first-degree AV block.  There were deep Q's in 3 and aVF, and  slow R wave progression across the precordium.  ADMISSION DIAGNOSES:  1. Chest pain in an 77 year old woman who has had two negative cardiac     catheterization.  Question coronary artery disease versus esophageal     spasm.  2. Hypothyroidism.  3. Obesity.  4. Edema.   PLAN OF TREATMENT:  1. Nitroglycerin drip in the ICU.  2. She has repeat enzymes and EKG pending.  3. We are going to decrease the nitroglycerin drip and stop it.  If records     from [facility] show a totally negative cardiac catheterization     two or three years ago, we will consider discharging her today, if     everything else is normal.  Otherwise, get cardiology consultation.  4. We will discuss other causes of chest pain with her.     ___________________________________________                                         Mila Homer. Sudie Bailey, M.D.   SDK/MEDQ  D:  07/05/2003  T:  07/05/2003  Job:  [id number]   cc:   Angus G. Renard Matter, M.D.  [address]  [place]  [place] [address]  Fax: [phone]

## 2010-10-29 NOTE — Discharge Summary (Signed)
NAMETHEKLA, [name]                             ACCOUNT NO.:  [id number]   MEDICAL RECORD NO.:  [id number]                   PATIENT TYPE:  INP   LOCATION:  IC07                                 FACILITY:  [facility]   PHYSICIAN:  Angus G. Renard Matter, M.D.              DATE OF BIRTH:  [birth year]   DATE OF ADMISSION:  07/05/2003  DATE OF DISCHARGE:  07/08/2003                                 DISCHARGE SUMMARY   This 77 year old white female admitted July 05, 2003 and discharged  July 08, 2003; three days hospitalization.   DIAGNOSES:  1. Chest pain.  2. Possible underlying coronary artery disease.  3. Syncope.  4. Hypothyroidism.   CONDITION:  The patient's condition is stable at the time of her discharge.   HISTORY:  An 77 year old female was awakened from sleep with a sharp  epigastric pain.  She presented to [facility] Emergency Department.  She had  had previous catheterization at [facility].  In the emergency room the  patient's pain improved with a nitroglycerin.  She had had exercise  intolerance, shortness of breath and fatigue with exercise.   PHYSICAL EXAMINATION:  GENERAL:  An elderly female complaining of pain in  the epigastrium.  VITAL SIGNS:  Blood pressure 151/22, heart rate 55, respiratory rate 55,  respiratory rate 20.  LUNGS:  Clear to P&A.  HEART:  Regular rhythm.  ABDOMEN:  No palpable organs or masses.  EXTREMITIES:  1+ edema of extremities.   LAB DATA:  Admission CBC WBC 7300, hemoglobin 13.4, hematocrit 49.7.  Chemistry:  Sodium 135, potassium 4, chloride 105, CO2 26, glucose 124, BUN  14, creatinine 0.9, calcium 8.8, total protein 5.7, albumin 3.5.  Liver  enzymes:  SGOT 91, SGPT 40, alkaline phosphatase 64, bilirubin 0.8, CPK on  admission:  117.  CPK/MB 3.8, troponin 0.01; subsequent CPK 167, CPK/MB 6.3,  troponin 101; and CPK 147, CPK/MB 4.8, troponin 0.01.  Chest x-ray on  admission.  No evidence of cardiopulmonary disease.  CT of the chest--no  evidence of DVT.  EKG regular rhythm with first degree AV block, inferior  infarct age undetermined.   HOSPITAL COURSE:  The patient at the time of her admission was continued on  nitroglycerin drip, normal saline at [facility] rate.  She was continued on  Synthroid 100 mcg daily, Neurontin 500 mg h.s., aspirin 81 mg daily, a 2-  gram sodium diet.  Her cardiac enzymes were monitored q.8h. She was placed  on Lovenox 1 mg/kg q.12h. subcu.  The patient was maintained on her  nitroglycerin drip to July 06, 2003 when it was discontinued.  She was  seen in consultation, by [facility], Dr. Dietrich Pates.  She did have  adenosine Cardiolite study done.  This was negative for ishemic changes.   The patient improved during her hospital stay and was able to be discharged  after 3 days hospitalization, asymptomatic.  She was continued on Synthroid  100 mcg daily, Neurontin 300 mg b.i.d. p.r.n. nitroglycerin.  The patient  was to see her family physician as an outpatient and cardiology as an  outpatient.     ___________________________________________                                         Ishmael Holter. Renard Matter, M.D.   AGM/MEDQ  D:  09/09/2003  T:  09/09/2003  Job:  [id number]

## 2010-10-29 NOTE — Group Therapy Note (Signed)
[name], [name]                             ACCOUNT NO.:  [id number]   MEDICAL RECORD NO.:  [id number]                   PATIENT TYPE:  INP   LOCATION:  IC07                                 FACILITY:  [facility]   PHYSICIAN:  Mila Homer. Sudie Bailey, M.D.           DATE OF BIRTH:  1920-10-25   DATE OF PROCEDURE:  DATE OF DISCHARGE:                                   PROGRESS NOTE   PROGRESS NOTE:  She is currently asymptomatic with no further chest pain, on  nitroglycerin drip.  She was having some low back pain last night, but has  cleared, on Hydrocodone, APAP.   On further discussion with her, she does use a good deal of salt in her  food.  She actually puts a bit of salt in her coffee every day and uses in  other foods, although she feels she does not overuse it.   We attempted to get all of her cardiac workups yesterday, but were only able  to get the workup from 1996, which was essentially negative for coronary  disease.  She tells me she has had a more recent one, but this so far, has  been unavailable.   PHYSICAL EXAMINATION:  GENERAL:  She is supine in bed, in no acute distress.  Well-developed, somewhat obese 77 year old woman.  VITAL SIGNS:  Temperature 97.4, pulse 53, respiratory rate 18, blood  pressure 108/66.  LUNGS:  Clear, moving air well.  HEART:  Regular rhythm, rate of about 50.  ABDOMEN:  Soft and obese without hepatosplenomegaly or mass.  EXTREMITIES:  No edema of the ankles.   LABORATORY DATA:  Review of cardiac enzymes shows CK went from 117 to 167 to  147.  The MB from 3.8 to 6.3 to 4.8.  Troponin stayed at 0.01 x3.   STUDIES:  EKG this morning shows Q's in 3 and AVF.  There are no acute  changes.  She is in sinus bradycardia at a rate of 51.   ASSESSMENT:  1. Chest pain.  2. Hypothyroidism.  3. Obesity.   PLAN:  Nitroglycerin drip will be stopped today.  Will have [facility] see her tomorrow (they are not here on weekends and today is   Sunday).     ___________________________________________                                            Mila Homer Sudie Bailey, M.D.   SDK/MEDQ  D:  07/06/2003  T:  07/06/2003  Job:  [id number]

## 2010-11-03 ENCOUNTER — Emergency Department (HOSPITAL_COMMUNITY): Payer: Medicare Other

## 2010-11-03 ENCOUNTER — Observation Stay (HOSPITAL_COMMUNITY)
Admission: EM | Admit: 2010-11-03 | Discharge: 2010-11-06 | Disposition: A | Discharge status: Home / Self Care | Payer: Medicare Other | Attending: Family Medicine | Admitting: Family Medicine

## 2010-11-03 DIAGNOSIS — R55 Syncope and collapse: Secondary | ICD-10-CM | POA: Insufficient documentation

## 2010-11-03 DIAGNOSIS — S40019A Contusion of unspecified shoulder, initial encounter: Secondary | ICD-10-CM | POA: Insufficient documentation

## 2010-11-03 DIAGNOSIS — R071 Chest pain on breathing: Secondary | ICD-10-CM | POA: Insufficient documentation

## 2010-11-03 DIAGNOSIS — Z79899 Other long term (current) drug therapy: Secondary | ICD-10-CM | POA: Insufficient documentation

## 2010-11-03 DIAGNOSIS — S20219A Contusion of unspecified front wall of thorax, initial encounter: Principal | ICD-10-CM | POA: Insufficient documentation

## 2010-11-03 DIAGNOSIS — R4182 Altered mental status, unspecified: Secondary | ICD-10-CM | POA: Insufficient documentation

## 2010-11-03 DIAGNOSIS — E039 Hypothyroidism, unspecified: Secondary | ICD-10-CM | POA: Insufficient documentation

## 2010-11-03 DIAGNOSIS — W1809XA Striking against other object with subsequent fall, initial encounter: Secondary | ICD-10-CM | POA: Insufficient documentation

## 2010-11-03 LAB — CBC
MCH: 28.8 pg (ref 26.0–34.0)
MCHC: 32.6 g/dL (ref 30.0–36.0)
Platelets: 207 10*3/uL (ref 150–400)

## 2010-11-03 LAB — DIFFERENTIAL
Basophils Relative: 0 % (ref 0–1)
Eosinophils Absolute: 0.3 10*3/uL (ref 0.0–0.7)
Monocytes Absolute: 0.6 10*3/uL (ref 0.1–1.0)
Monocytes Relative: 7 % (ref 3–12)

## 2010-11-03 LAB — BASIC METABOLIC PANEL
CO2: 27 mEq/L (ref 19–32)
Calcium: 10.1 mg/dL (ref 8.4–10.5)
Creatinine, Ser: 0.82 mg/dL (ref 0.4–1.2)
GFR calc Af Amer: >60 mL/min (ref >60)

## 2010-11-04 ENCOUNTER — Inpatient Hospital Stay (HOSPITAL_COMMUNITY): Payer: Medicare Other

## 2010-11-04 LAB — TSH: TSH: 0.181 u[IU]/mL — ABNORMAL LOW (ref 0.350–4.500)

## 2010-11-05 ENCOUNTER — Inpatient Hospital Stay (HOSPITAL_COMMUNITY): Admit: 2010-11-05 | Discharge: 2010-11-05 | Disposition: A | Discharge status: Home / Self Care | Payer: Medicare Other

## 2010-11-10 NOTE — Group Therapy Note (Signed)
  NAMEJACKSON, [name]                   ACCOUNT NO.:  [id number]  MEDICAL RECORD NO.:  [id number]           PATIENT TYPE:  O  LOCATION:  A302                          FACILITY:  [facility]  PHYSICIAN:  Bessie Boyte G. Renard Matter, MD   DATE OF BIRTH:  02-26-1921  DATE OF PROCEDURE: DATE OF DISCHARGE:                                PROGRESS NOTE   This patient was admitted to the hospital following a fall at home.  She did strike her right chest on an object and has pain in the right chest. She did have episode of mental confusion earlier today, pulled out IVs, apparently had wild look in eyes and nurse thought that she might have had a seizure after this episode which occurred in the middle of the night.  The patient was back to normal, although still complaining of right chest pain.  OBJECTIVE:  VITAL SIGNS:  Blood pressure 139/63, pulse 49, respirations 18, temperature 98. LUNGS:  Diminished breath sounds over lower lung field. HEART:  Regular rhythm. ABDOMEN:  No palpable organs or masses. NEUROLOGIC:  No focal deficit.  ASSESSMENT:  The patient was admitted with a fall and injury to her right chest wall.  She did have carotid bruits but the carotid Doppler ultrasound did not show significant evidence of blockage in carotid arteries with the episode that she had in the middle of night of change in mental status and possible seizure activity.  We will put the patient on telemetry and obtain Neurology consult today and delay discharge.     Imunique Samad G. Renard Matter, MD     AGM/MEDQ  D:  11/05/2010  T:  11/05/2010  Job:  [id number]  Electronically Signed by Butch Penny MD on 11/10/2010 06:21:43 AM

## 2010-11-10 NOTE — Discharge Summary (Signed)
[name], [name]                   ACCOUNT NO.:  [id number]  MEDICAL RECORD NO.:  [id number]           PATIENT TYPE:  O  LOCATION:  A302                          FACILITY:  [facility]  PHYSICIAN:  Jaymi Tinner G. Renard Matter, MD   DATE OF BIRTH:  October 03, 1920  DATE OF ADMISSION:  11/03/2010 DATE OF DISCHARGE:  05/26/2012LH                              DISCHARGE SUMMARY   This 77 year old white female admitted on Nov 03, 2010, and discharged on Nov 06, 2010, three days' hospitalization.  DIAGNOSES: 1. Fall. 2. Contusion of right rib cage and right shoulder. 3. Hypothyroidism. 4. Sundown syndrome.  CONDITION:  Stable and improved at the time of her discharge.  This 77 year old white female lives at home with her son and is very active.  She apparently fell unexpectedly at home against the kitchen cabinet and had severe contusion of the right ribcage and shoulder.  X- rays of these areas were essentially negative.  She had had pleuritic- type chest pain since the fall.  She was subsequently admitted to observation.  Apparently, there was questionable carotid bruit on the right on admission.  PHYSICAL EXAMINATION:  GENERAL:  An elderly female. VITAL SIGNS:  Blood pressure 123/67, temperature 97.8, pulse 64, respirations 16. EYES:  PERRLA.  TMs negative.  Oropharynx benign. NECK:  Supple.  No JVD or thyroid abnormalities.  Questionable carotid bruit on the right.  No diminished upstroke. LUNGS:  Prolonged expiratory phase. HEART:  Regular rhythm.  No murmurs. ABDOMEN:  No palpable organs or masses. EXTREMITIES:  1+ pedal edema. NEUROLOGIC:  The patient is alert and oriented.  Very conversant cranial nerves, II-XII, intact.  The patient moves all four extremities well. Plantars are downgoing.  LABORATORY DATA:  CBC:  WBC 9000 with hemoglobin 12.6, hematocrit 38.7. BMP:  Sodium 139, potassium 4.1, chloride 103, CO2 of 27, glucose 96, BUN 13, creatinine 0.82.  GFR greater than 60.  TSH  0.181.  X-RAYS:  CT of the head on admission:  No acute intracranial abnormality, stable atrophy and white matter disease, right shoulder osseous demineralization, no definite fracture, mild narrowing of the right glenohumeral joint consistent with mild degenerative changes.  Ribs and chest:  No acute rib fractures, enlargement of cardiac silhouette, chronic bronchitic changes.  Carotid duplex ultrasound:  Mild atherosclerotic disease involving carotid arteries, no significant carotid artery stenosis, estimated degree of stenosis, and internal carotid arteries less than 50% bilaterally.  HOSPITAL COURSE:  The patient at the time of her admission was placed on IV fluids, normal saline at 75 mL per hour.  She was given Vicodin 5/500 every 4 hours p.r.n. for pain and put on DVT prophylaxis with 30 mg of Lovenox q.12 h.  A carotid Doppler ultrasound was ordered.  She was placed on 2 g low-sodium diet.  She is continued on Synthroid 100 mcg daily, Neurontin 300 mg at bedtime, and spirometry.  Also, Mobic 15 mg daily was added, and ribs splint was ordered during the day.  The patient had her thyroid dose changed to 50 mcg daily.  She was seen in consultation by Neurology, Dr. Gerilyn Pilgrim who  felt that she might have sundowning.  EEG was ordered.  Report not available.  The patient has remained stable, although complaining of right ribcage soreness and tenderness.  It was felt she was able to be discharged on the third hospital day and was discharged on the following medications. Hydrocodone/APAP 5/325 one every 4 hours p.r.n., levothyroxine 50 mcg daily, meloxicam 15 mg daily, acetaminophen 500 mg 2 tablets by mouth daily at bedtime, gabapentin 300 mg daily at bedtime.  The patient was stable at the time of her discharge.     Octavius Shin G. Renard Matter, MD     AGM/MEDQ  D:  11/06/2010  T:  11/06/2010  Job:  [id number]  Electronically Signed by Butch Penny MD on 11/10/2010 06:21:39 AM

## 2010-11-15 NOTE — Procedures (Signed)
[name], [name]                   ACCOUNT NO.:  [id number]  MEDICAL RECORD NO.:  [id number]           PATIENT TYPE:  A  LOCATION:  EE                           FACILITY:  [facility]  PHYSICIAN:  Deirdre Gryder A. Gerilyn Pilgrim, M.D. DATE OF BIRTH:  1920/08/02  DATE OF PROCEDURE:  11/05/2010 DATE OF DISCHARGE:  11/05/2010                             EEG INTERPRETATION   REFERRING PHYSICIAN:  Angus G. McInnis, MD  INDICATIONS:  An 77 year old who was admitted to the hospital with unresponsiveness and also had a seizure activity.  MEDICATIONS:  Gabapentin, Synthroid, Lovenox, Mobic, Vicodin, Zofran.  ANALYSIS:  This is a 16-channel recording using standard 10/20 measurements, this is conducted for 21 minutes.  There is a well-formed posterior dominant rhythm of 10 Hz, which attenuates with eye opening. There is beta activity observed in the frontal areas.  Brief amount of stage II sleep is seen with some spindles complex.  Photic stimulation is carried out without abnormal changes in the background activity. There is no focal or lateralized slowing.  There is no epileptiform activity observed.  IMPRESSION:  Normal recording in the awake and sleep states.     Nastasia Kage A. Gerilyn Pilgrim, M.D.     KAD/MEDQ  D:  11/14/2010  T:  11/15/2010  Job:  [id number]

## 2010-11-19 NOTE — Consult Note (Signed)
NAMEJAMETTA, MOOREHEAD                   ACCOUNT NO.:  [id number]  MEDICAL RECORD NO.:  [id number]           PATIENT TYPE:  O  LOCATION:  A302                          FACILITY:  [facility]  PHYSICIAN:  Shamone Winzer A. Gerilyn Pilgrim, M.D. DATE OF BIRTH:  Sep 25, 1920  DATE OF CONSULTATION: DATE OF DISCHARGE:                                CONSULTATION   REASON FOR CONSULTATION:  Altered mentation.  This is an 77 year old lady, who was at her house working.  She apparently was baking a cake.  She left to answer the phone and sat down to talk to a friend for about a half an hour.  She got up and went back to the kitchen and fell forward.  She denied any dizziness.  There was no seizure activity, no chest pain, no shortness of breath.  She simply just fell forward.  Again, she did not lose consciousness.  She actually was able to get up and finish doing what she was doing in the kitchen. She actually baking the cake, but complained of significant right-sided chest pain and thought she may have fractured a rib.  She was taken to the hospital for further evaluation.  On last night, the patient was sleeping and she became acutely confused, belligerent, disoriented.  The patient does have some recollection of this.  She reports remembering awakening and was confused as far as where she was, things just did not look right.  She does have some amnesia to what happened last night, however.  The patient has been given pain medications, hydrocodone for her pain, it is unclear if this contributed to her acute confusion but certainly could.  She is on other psychotropic medications, particularly Neurontin which she takes at bedtime and also levothyroxine.  She seems back to baseline this morning is lucid and coherent.  PAST MEDICAL HISTORY: 1. Peripheral neuropathy. 2. Hypothyroidism.  PAST SURGICAL HISTORY: 1. Vaginal hysterectomy. 2. Cholecystectomy. 3. Appendectomy.  ALLERGIES:  None known.  ADMISSION  MEDICATION: 1. Neurontin 300 mg at bedtime. 2. Levothyroxine 100 mcg daily.  SOCIAL HISTORY:  Very active lady, worked as a Neurosurgeon.  No tobacco, alcohol, or illicit drug use.  Has a very supportive family.  REVIEW OF SYSTEMS:  As stated in the HPI, otherwise negative.  Again, no headaches.  She denies any diplopia, lightheadedness, focal neurological symptoms.  No dysarthria or dysphagia.  The patient reports that she has had episodes where she does not feel right and feels sweaty and has some shaking.  She apparently has had many of these spells in the past, her son who is present today reports that she has not had one recently. Typically, these spells last for about 30 minutes when she will go and sit down.  She did not have this spell with her most recent series of events.  PHYSICAL EXAMINATION:  GENERAL:  This is a pleasant, moderately overweight lady in no acute distress. VITAL SIGNS:  Temperature 98.0, pulse 51, respirations 17, blood pressure 149/68. HEENT:  She does have so much of an overbite.  Head is normocephalic, atraumatic. NECK:  Supple. ABDOMEN:  Soft. EXTREMITIES:  No significant edema. MENTATION:  She is awake and alert.  She is lucid and coherent.  Speech, language, and cognition are generally intact.  She follows commands well and has good spontaneous speech, good insight into her medical condition.  Cranial nerve evaluation; pupils are equal, round, and reactive to light.  Visual fields are full.  Although she has full extraocular movements, she did have some sustained nystagmus on end gaze bilaterally, somewhat more to the left side.  Face muscle strength is symmetric.  Tongue midline.  Uvula midline.  Shoulder shrugs diminished on the right side.  Motor examination, she has actually reduced range of motion on the right upper extremity, limited to pain, most of pain involving the thoracic region laterally and anteriorly.  She does have some weakness,  again they are mostly 4/5 on the right side, although hand grip is normal.  Left upper extremity shows normal tone, bulk, and strength.  The bulk and tone is normal on the right upper extremity. She does have some mild proximal leg weakness bilaterally 4/5, distal strength is 5/5.  Tone and bulk are normal.  Reflexes are preserved. Plantars are both downgoing.  Sensation normal to light touch. Temperature is normal.  SUPPORTIVE DATA:  Head CT scan of brain shows atrophy and chronic ischemic white matter changes.  LABORATORY DATA:  TSH is on the low side 0.18.  Carotid Doppler essentially shows no hemodynamic significant stenosis, the left velocity is slightly elevated at 120, right is 87.  ASSESSMENT AND PLAN: 1. Acute confusional state the last night, likely due to sundowning     syndrome, possibly medication effect, although she was given only a     mild dose of pain medications. 2. Fall of unclear etiology. 3. Past episodes of shaking, unclear etiology.  RECOMMENDATIONS: 1. Consider orthostatics. 2. EEG.  Thank you for this consultation.     Araceli Arango A. Gerilyn Pilgrim, M.D.     KAD/MEDQ  D:  11/05/2010  T:  11/05/2010  Job:  [id number]  Electronically Signed by Beryle Beams M.D. on 11/19/2010 04:55:27 PM

## 2011-01-18 NOTE — H&P (Signed)
  [name], [name]                   ACCOUNT NO.:  [id number]  MEDICAL RECORD NO.:  [id number]           PATIENT TYPE:  LOCATION:                                 FACILITY:  PHYSICIAN:  Melvyn Novas, MDDATE OF BIRTH:  1921-05-02  DATE OF ADMISSION: DATE OF DISCHARGE:  LH                             HISTORY & PHYSICAL   The patient is an 77 year old white female who lives with her son, who is very active, was backing a cake and had a fall which was precipitated by at best can be described as possible vertigo sensation.  She denies any antecedent chest pain, palpitations, nausea, vomiting, or diaphoresis.  She fell against the kitchen cabinet and has severe contusions of her rib and shoulder.  X-rays in these areas were essentially negative.  She does have pleuritic chest pain since the fall.  She will be admitted, placed on analgesia and Lovenox, observed, and given incentive spirometry to avoid any atelectasis or pneumonia.  PAST MEDICAL HISTORY:  Significant for peripheral neuropathy and hypothyroidism.  PAST SURGICAL HISTORY:  Remarkable for vaginal hysterectomy, cholecystectomy, and appendectomy.  ALLERGIES:  She has no known allergies.  CURRENT MEDICATIONS:  Neurontin 300 mg p.o. at bedtime and Synthroid 100 mcg p.o. daily.  SOCIAL HISTORY:  She does not smoke.  Does not drink.  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  PHYSICAL EXAMINATION:  VITAL SIGNS: Blood pressure is 123/67, temperature 97.8, pulse 64 and regular, respiratory rate is 16, O2 sat is 98%. EYES:  PERRL intact.  Sclerae are clear.  Conjunctivae are pink. NECK:  Shows no JVD.  Questionable carotid bruit on the right.  No diminished upstroke.  No thyromegaly. LUNGS:  Show prolonged expiratory phase.  No rales, wheeze, or rhonchi appreciable. HEART:  Regular rhythm.  No murmurs, gallops, heaves, thrills, or rubs. ABDOMEN:  Soft, nontender.  Bowel  sounds normoactive.  No guarding, rebound, or hepatosplenomegaly. CHEST:  There is tenderness in the right rib cage area and right shoulder upon motion.  No crepitus noted.  Lungs sounds are adequate in all fields. EXTREMITIES:  Trace to 1+ pedal edema. NEUROLOGIC:  The patient is alert and oriented, very conversant. Cranial nerves II through XII grossly intact.  The patient moves all four extremities.  Plantars are downgoing.  IMPRESSION:  As follows. 1. Fall. 2. Rib contusion. 3. Shoulder contusion. 4. Hypothyroidism. 5. Consider carotid stenosis, vertebrobasilar insufficiency.  We will     do carotid ultrasound in a.m., Lovenox, give narcotic analgesia and     make further recommendations as the database expands.     Melvyn Novas, MD     RMD/MEDQ  D:  11/03/2010  T:  11/04/2010  Job:  [id number]  Electronically Signed by Oval Linsey MD on 01/18/2011 03:07:41 PM

## 2011-02-07 ENCOUNTER — Ambulatory Visit (HOSPITAL_COMMUNITY)
Admission: RE | Admit: 2011-02-07 | Discharge: 2011-02-07 | Disposition: A | Discharge status: Home / Self Care | Payer: Medicare Other | Source: Ambulatory Visit | Attending: Family Medicine | Admitting: Family Medicine

## 2011-02-07 ENCOUNTER — Other Ambulatory Visit (HOSPITAL_COMMUNITY): Payer: Self-pay | Admitting: Family Medicine

## 2011-02-07 DIAGNOSIS — M25559 Pain in unspecified hip: Secondary | ICD-10-CM | POA: Insufficient documentation

## 2011-02-07 DIAGNOSIS — M899 Disorder of bone, unspecified: Secondary | ICD-10-CM | POA: Insufficient documentation

## 2011-02-07 DIAGNOSIS — M949 Disorder of cartilage, unspecified: Secondary | ICD-10-CM | POA: Insufficient documentation

## 2011-09-01 ENCOUNTER — Encounter: Payer: Self-pay | Admitting: Internal Medicine

## 2011-09-05 ENCOUNTER — Ambulatory Visit: Payer: Medicare Other | Admitting: Gastroenterology

## 2011-09-06 ENCOUNTER — Ambulatory Visit (INDEPENDENT_AMBULATORY_CARE_PROVIDER_SITE_OTHER): Payer: Medicare Other | Admitting: Gastroenterology

## 2011-09-06 ENCOUNTER — Encounter: Payer: Self-pay | Admitting: Gastroenterology

## 2011-09-06 VITALS — BP 120/80 | HR 53 | Temp 97.6°F | Ht 62.5 in | Wt 172.4 lb

## 2011-09-06 DIAGNOSIS — R490 Dysphonia: Secondary | ICD-10-CM | POA: Insufficient documentation

## 2011-09-06 DIAGNOSIS — R1314 Dysphagia, pharyngoesophageal phase: Secondary | ICD-10-CM

## 2011-09-06 DIAGNOSIS — R1310 Dysphagia, unspecified: Secondary | ICD-10-CM | POA: Insufficient documentation

## 2011-09-06 MED ORDER — ESOMEPRAZOLE MAGNESIUM 40 MG PO CPDR: 40.0000 mg | DELAYED_RELEASE_CAPSULE | Freq: Every day | ORAL | Status: DC

## 2011-09-06 NOTE — Patient Instructions (Addendum)
Make sure you drink plenty of fluid with taking your pills and sit upright for 30 minutes afterwards. Start Nexium. Take one capsule 30 minutes before breakfast daily. You may empty into applesauce and swallow. Do not chew. We have scheduled you for an upper endoscopy with esophageal dilation in the near future. Please see separate instructions.  Dysphagia Swallowing problems (dysphagia) occur when solids and liquids seem to stick in your throat on the way down to your stomach, or the food takes longer to get to the stomach. Other symptoms (problems) include regurgitating (burping) up food, noises coming from the throat, chest discomfort with swallowing, and a feeling of fullness in the throat when swallowing. When blockage in the throat is complete it may be associated with drooling. CAUSES There are many causes of swallowing difficulties and the following is generalized information regarding a number of reasons for this problem. Problems with swallowing may occur because of problems with the muscles. The food cannot be propelled in the usual manner into the stomach. There may be ulcers, scar tissueor inflammation (soreness) in the esophagus (the food tube from the mouth to the stomach) which blocks food from passing normally into the stomach. Causes of inflammation include acid reflux from the stomach into the esophagus. Inflammation can also be caused by the herpes simplex virus, Candida (yeast), radiation (as with treatment of cancer), or inflammation from medications not taken with adequate fluids to wash them down into the stomach. There may be nerve problems so signals cannot be sent adequately telling the muscles of the esophagus to contract and move the food along. Achalasia is a rare disorder of the esophagus in which muscular contractions of the esophagus are uncoordinated. Globus hystericus is a relatively common problem in young females in which there is a sense of an obstruction or difficulty in  swallowing, but in which no abnormalities can be found. This problem usually improves over time with reassurance and testing to rule out other causes. DIAGNOSIS A number of tests will help your caregiver know what is the cause of your swallowing problems. These tests may include a barium swallow in which x-rays are taken while you are drinking a liquid that outlines the lining of the esophagus on x-ray. If the stomach and small bowel are also studied in this manner it is called an upper gastrointestinal exam (UGI). Endoscopy may be done in which your caregiver examines your throat, esophagus, stomach and small bowel with an instrument like a small flexible telescope. Motility studies which measure the effectiveness and coordination of the muscular contractions of the esophagus may also be done. TREATMENT The treatment of swallowing problems are many, varying from medications to surgical treatment. The treatment varies with the type of problem found. Your caregiver will discuss your results and treatment with you. If swallowing problems are severe the long term problems which may occur include: malnutrition, pneumonia (from food going into the breathing tubes called trachea and bronchi), and an increase in tumors (lumps) of the esophagus. SEEK IMMEDIATE MEDICAL CARE IF:  Food or other object becomes lodged in your throat or esophagus and won't move.   You have difficulty with throat swelling or breathing. Document Released: 05/27/2000 Document Revised: 05/19/2011 Document Reviewed: 01/16/2008 [facility] Patient Information 2012 [place], [place].

## 2011-09-06 NOTE — Progress Notes (Signed)
Primary Care Physician:  [name], MD, MD  Primary Gastroenterologist:  Roetta Sessions, MD   Chief Complaint  Patient presents with  . Dysphagia    HPI:  [name] is a 78 y.o. female here for further evaluation of worsening dysphagia for past few weeks. Recently food coming back up with bending over.  Trouble swallowing pills, food, liquids. Has had pill come back up as well. Sometimes feel like the pills are getting stuck. No abdominal pain, constipation, diarrhea, melena, brbpr, anorexia. Clothes getting big. Not sure how much weight loss. Hoarseness too. Denies typical heartburn but complains of burning in throat.  Current Outpatient Prescriptions  Medication Sig Dispense Refill  . gabapentin (NEURONTIN) 300 MG capsule Take 300 mg by mouth 3 (three) times daily.      Marland Kitchen levothyroxine (SYNTHROID, LEVOTHROID) 50 MCG tablet Take 50 mcg by mouth daily.        Allergies as of 09/06/2011 - Review Complete 09/06/2011  Allergen Reaction Noted  . Lactulose    . Penicillins      Past Medical History  Diagnosis Date  . Hypothyroidism     Past Surgical History  Procedure Date  . Esophagogastroduodenoscopy 10/24/08    normal, status post [facility] dilator, small hiatal hernia deformity of the antrum/pylorus  . Colonoscopy 10/24/08    anal papilla otherwise normal, pan colonic diverticulum and colonic mucosa  . Appendectomy 1941  . Abdominal hysterectomy 1977  . Cholecystectomy 1967  . Spine surgery 1986  . Knee surgery 09/2008  . Hemorrhoid surgery 1999  . Colonoscopy remote    Dr. Jerolyn Shin Smith-->polpys per patient  . Ercp 1995    Dr. Rourk--> for dilated biliary tree. no stone or neoplasm or stricture found  . Esophagogastroduodenoscopy 1995    Dr. Rourk--> empiric dilation of esophagus with resolution of dysphagia    Family History  Problem Relation Age of Onset  . Heart attack Father 52    deceased    History   Social History  . Marital Status: Widowed    Spouse  Name: N/A    Number of Children: 3  . Years of Education: N/A   Occupational History  . Retired Neurosurgeon    Social History Main Topics  . Smoking status: Never Smoker   . Smokeless tobacco: Not on file  . Alcohol Use: No  . Drug Use: No  . Sexually Active: Not on file   Other Topics Concern  . Not on file   Social History Narrative  . No narrative on file      ROS:  General: Negative for anorexia, fever, chills, fatigue, weakness. She suspects weight loss giving that her clothes her becoming loose. Eyes: Negative for vision changes.  ENT: Negative for nasal congestion. Throat is sore. Complaint of worsening hoarseness over the past couple of weeks. CV: Negative for chest pain, angina, palpitations, dyspnea on exertion, peripheral edema.  Respiratory: Negative for dyspnea at rest, dyspnea on exertion, cough, sputum, wheezing.  GI: See history of present illness. GU:  Negative for dysuria, hematuria, urinary incontinence, urinary frequency, nocturnal urination.  MS: Negative for joint pain, low back pain.  Derm: Negative for rash or itching.  Neuro: Negative for weakness, seizure, frequent headaches, memory loss, confusion. Chronic peripheral neuropathy.  Psych: Negative for anxiety, depression, suicidal ideation, hallucinations.  Endo: Negative for unusual weight change.  Heme: Negative for bruising or bleeding. Allergy: Negative for rash or hives.    Physical Examination:  BP 120/80  Pulse 53  Temp(Src) 97.6 F (36.4 C) (Temporal)  Ht 5' 2.5" (1.588 m)  Wt 172 lb 6.4 oz (78.2 kg)  BMI 31.03 kg/m2   General: Well-nourished, well-developed in no acute distress.  Head: Normocephalic, atraumatic.   Eyes: Conjunctiva pink, no icterus. Mouth: Oropharyngeal mucosa moist and pink , no lesions erythema or exudate. Neck: Supple without thyromegaly, masses, or lymphadenopathy.  Lungs: Clear to auscultation bilaterally.  Heart: Regular rate and rhythm, no murmurs rubs or  gallops.  Abdomen: Bowel sounds are normal, nontender, nondistended, no hepatosplenomegaly or masses, no abdominal bruits or hernia , no rebound or guarding.   Rectal: not performed Extremities: No lower extremity edema. No clubbing or deformities.  Neuro: Alert and oriented x 4 , grossly normal neurologically.  Skin: Warm and dry, no rash or jaundice.   Psych: Alert and cooperative, normal mood and affect.

## 2011-09-06 NOTE — Assessment & Plan Note (Signed)
Complains of esophageal dysphagia to solids, pills, liquids. Feels like pills are getting stuck at times. Has had pills come up. Complains of regurgitation. Complains of hoarseness and burning in the throat. I will start Nexium 40mg , 30 minutes before breakfast. EGD/ED in near future.  I have discussed the risks, alternatives, benefits with regards to but not limited to the risk of reaction to medication, bleeding, infection, perforation and the patient is agreeable to proceed. Written consent to be obtained.  Drink plenty of fluid with pills and solid foods. Sit upright for 30 minutes afterwards. Warning signs discussed with patient.

## 2011-09-06 NOTE — Progress Notes (Signed)
Faxed to PCP

## 2011-09-20 MED ORDER — SODIUM CHLORIDE 0.45 % IV SOLN
Freq: Once | INTRAVENOUS | Status: AC
  Administered 2011-09-21: 08:00:00 via INTRAVENOUS

## 2011-09-21 ENCOUNTER — Encounter (HOSPITAL_COMMUNITY)
Admission: RE | Disposition: A | Discharge status: Home / Self Care | Payer: Self-pay | Source: Ambulatory Visit | Attending: Internal Medicine

## 2011-09-21 ENCOUNTER — Encounter (HOSPITAL_COMMUNITY): Payer: Self-pay | Admitting: *Deleted

## 2011-09-21 ENCOUNTER — Ambulatory Visit (HOSPITAL_COMMUNITY)
Admission: RE | Admit: 2011-09-21 | Discharge: 2011-09-21 | Disposition: A | Discharge status: Home / Self Care | Payer: Medicare Other | Source: Ambulatory Visit | Attending: Internal Medicine | Admitting: Internal Medicine

## 2011-09-21 DIAGNOSIS — R1310 Dysphagia, unspecified: Secondary | ICD-10-CM

## 2011-09-21 DIAGNOSIS — K319 Disease of stomach and duodenum, unspecified: Secondary | ICD-10-CM | POA: Insufficient documentation

## 2011-09-21 DIAGNOSIS — K222 Esophageal obstruction: Secondary | ICD-10-CM | POA: Insufficient documentation

## 2011-09-21 DIAGNOSIS — R933 Abnormal findings on diagnostic imaging of other parts of digestive tract: Secondary | ICD-10-CM

## 2011-09-21 DIAGNOSIS — R490 Dysphonia: Secondary | ICD-10-CM

## 2011-09-21 DIAGNOSIS — K449 Diaphragmatic hernia without obstruction or gangrene: Secondary | ICD-10-CM | POA: Insufficient documentation

## 2011-09-21 HISTORY — DX: Shortness of breath: R06.02

## 2011-09-21 HISTORY — PX: ESOPHAGOGASTRODUODENOSCOPY: SHX1529

## 2011-09-21 SURGERY — ESOPHAGOGASTRODUODENOSCOPY (EGD) WITH ESOPHAGEAL DILATION
Anesthesia: Moderate Sedation

## 2011-09-21 MED ORDER — MEPERIDINE HCL 100 MG/ML IJ SOLN
INTRAMUSCULAR | Status: AC
  Filled 2011-09-21: qty 1

## 2011-09-21 MED ORDER — MIDAZOLAM HCL 5 MG/5ML IJ SOLN
INTRAMUSCULAR | Status: DC | PRN
  Administered 2011-09-21 (×2): 1 mg via INTRAVENOUS
  Administered 2011-09-21: 2 mg via INTRAVENOUS

## 2011-09-21 MED ORDER — MEPERIDINE HCL 100 MG/ML IJ SOLN
INTRAMUSCULAR | Status: DC | PRN
Start: 2011-09-21 — End: 2011-09-21
  Administered 2011-09-21: 25 mg via INTRAVENOUS

## 2011-09-21 MED ORDER — MIDAZOLAM HCL 5 MG/5ML IJ SOLN
INTRAMUSCULAR | Status: AC
  Filled 2011-09-21: qty 5

## 2011-09-21 MED ORDER — STERILE WATER FOR IRRIGATION IR SOLN
Status: DC | PRN
  Administered 2011-09-21: 09:00:00

## 2011-09-21 NOTE — Discharge Instructions (Signed)
EGD Discharge instructions Please read the instructions outlined below and refer to this sheet in the next few weeks. These discharge instructions provide you with general information on caring for yourself after you leave the hospital. Your doctor may also give you specific instructions. While your treatment has been planned according to the most current medical practices available, unavoidable complications occasionally occur. If you have any problems or questions after discharge, please call your doctor. ACTIVITY  You may resume your regular activity but move at a slower pace for the next 24 hours.   Take frequent rest periods for the next 24 hours.   Walking will help expel (get rid of) the air and reduce the bloated feeling in your abdomen.   No driving for 24 hours (because of the anesthesia (medicine) used during the test).   You may shower.   Do not sign any important legal documents or operate any machinery for 24 hours (because of the anesthesia used during the test).  NUTRITION  Drink plenty of fluids.   You may resume your normal diet.   Begin with a light meal and progress to your normal diet.   Avoid alcoholic beverages for 24 hours or as instructed by your caregiver.  MEDICATIONS  You may resume your normal medications unless your caregiver tells you otherwise.  WHAT YOU CAN EXPECT TODAY  You may experience abdominal discomfort such as a feeling of fullness or "gas" pains.  FOLLOW-UP  Your doctor will discuss the results of your test with you.  SEEK IMMEDIATE MEDICAL ATTENTION IF ANY OF THE FOLLOWING OCCUR:  Excessive nausea (feeling sick to your stomach) and/or vomiting.   Severe abdominal pain and distention (swelling).   Trouble swallowing.   Temperature over 101 F (37.8 C).   Rectal bleeding or vomiting of blood.   Continue Nexium daily. Further recommendations to follow pending review of pathology report.

## 2011-09-21 NOTE — Op Note (Signed)
[facility] [address] [place], [place]  [address]  ENDOSCOPY PROCEDURE REPORT  PATIENT:  [name], [name]  MR#:  [id number] BIRTHDATE:  12/31/1920, 89 yrs. old  GENDER:  female  ENDOSCOPIST:  R. Roetta Sessions, MD FACP [facility] Referred by:  Butch Penny, M.D.  PROCEDURE DATE:  09/21/2011 PROCEDURE:  EGD with Elease Hashimoto dilation and esophageal biopsy  INDICATIONS:   esophageal dysphagia  INFORMED CONSENT:   The risks, benefits, limitations, alternatives and imponderables have been discussed.  The potential for biopsy, esophogeal dilation, etc. have also been reviewed.  Questions have been answered.  All parties agreeable.  Please see the history and physical in the medical record for more information.  MEDICATIONS: Versed 3 mg IV and Demerol 25 mg IV in divided doses.   DESCRIPTION OF PROCEDURE:   The EG-[id number] ([id number]) endoscope was introduced through the mouth and advanced to the second portion of the duodenum without difficulty or limitations.  The mucosal surfaces were surveyed very carefully during advancement of the scope and upon withdrawal.  Retroflexion view of the proximal stomach and esophagogastric junction was performed.  <<PROCEDUREIMAGES>>  FINDINGS:  Probable noncritical Schatzki's ring. Esophageal mucosa covered with the tissue paper-like membrane. Gray in color. Easily "stripped" away with gentle contact with scope. The stomach emptied. Small hiatal hernia. Deformity of the antrum/pylorus. No ulcer or infiltrating process (chronic finding). Normal first and second portion of the duodenum.  THERAPEUTIC / DIAGNOSTIC MANEUVERS PERFORMED:  A 54 and subsequently a 26 French Maloney dilator were passed to full insertion easily. A lid back revealed no apparent complication related to these maneuvers. Subsequently, apices of the distal and midesophagus were taken for histologic study.  COMPLICATIONS:   None  IMPRESSION:               Noncritical Schatzki's  ring   -  status post dilation as described above. Abnormal esophageal mucosa (query esophagitis                 desicans  -  status post biopsy). Small  hiatal hermia. Chronic deformity of the antrum/pyloric channel.  Dysphagia                 symptoms  out of proportion to objective findings. An occult underlying esophageal motility disorder is not excluded at this time.  RECOMMENDATIONS:   Continue Nexium 40 mg daily. Followup on pathology. Further recommendations to follow.  ______________________________ R. Roetta Sessions, MD Caleen Essex  CC:  n. eSIGNED:   R. Roetta Sessions at 09/21/2011 09:00 AM  Gaston Islam, [phone]

## 2011-09-21 NOTE — Interval H&P Note (Signed)
History and Physical Interval Note:  09/21/2011 8:26 AM  [name]  has presented today for surgery, with the diagnosis of dysphagia  The various methods of treatment have been discussed with the patient and family. After consideration of risks, benefits and other options for treatment, the patient has consented to  Procedure(s) (LRB): ESOPHAGOGASTRODUODENOSCOPY (EGD) WITH ESOPHAGEAL DILATION (N/A) as a surgical intervention .  The patients' history has been reviewed, patient examined, no change in status, stable for surgery.  I have reviewed the patients' chart and labs.  Questions were answered to the patient's satisfaction.     Eula Listen

## 2011-09-21 NOTE — H&P (View-Only) (Signed)
Primary Care Physician:  [name], MD, MD  Primary Gastroenterologist:  Roetta Sessions, MD   Chief Complaint  Patient presents with  . Dysphagia    HPI:  ARLYNN STARE is a 78 y.o. female here for further evaluation of worsening dysphagia for past few weeks. Recently food coming back up with bending over.  Trouble swallowing pills, food, liquids. Has had pill come back up as well. Sometimes feel like the pills are getting stuck. No abdominal pain, constipation, diarrhea, melena, brbpr, anorexia. Clothes getting big. Not sure how much weight loss. Hoarseness too. Denies typical heartburn but complains of burning in throat.  Current Outpatient Prescriptions  Medication Sig Dispense Refill  . gabapentin (NEURONTIN) 300 MG capsule Take 300 mg by mouth 3 (three) times daily.      Marland Kitchen levothyroxine (SYNTHROID, LEVOTHROID) 50 MCG tablet Take 50 mcg by mouth daily.        Allergies as of 09/06/2011 - Review Complete 09/06/2011  Allergen Reaction Noted  . Lactulose    . Penicillins      Past Medical History  Diagnosis Date  . Hypothyroidism     Past Surgical History  Procedure Date  . Esophagogastroduodenoscopy 10/24/08    normal, status post [facility] dilator, small hiatal hernia deformity of the antrum/pylorus  . Colonoscopy 10/24/08    anal papilla otherwise normal, pan colonic diverticulum and colonic mucosa  . Appendectomy 1941  . Abdominal hysterectomy 1977  . Cholecystectomy 1967  . Spine surgery 1986  . Knee surgery 09/2008  . Hemorrhoid surgery 1999  . Colonoscopy remote    Dr. Jerolyn Shin Smith-->polpys per patient  . Ercp 1995    Dr. Rourk--> for dilated biliary tree. no stone or neoplasm or stricture found  . Esophagogastroduodenoscopy 1995    Dr. Rourk--> empiric dilation of esophagus with resolution of dysphagia    Family History  Problem Relation Age of Onset  . Heart attack Father 61    deceased    History   Social History  . Marital Status: Widowed    Spouse  Name: N/A    Number of Children: 3  . Years of Education: N/A   Occupational History  . Retired Neurosurgeon    Social History Main Topics  . Smoking status: Never Smoker   . Smokeless tobacco: Not on file  . Alcohol Use: No  . Drug Use: No  . Sexually Active: Not on file   Other Topics Concern  . Not on file   Social History Narrative  . No narrative on file      ROS:  General: Negative for anorexia, fever, chills, fatigue, weakness. She suspects weight loss giving that her clothes her becoming loose. Eyes: Negative for vision changes.  ENT: Negative for nasal congestion. Throat is sore. Complaint of worsening hoarseness over the past couple of weeks. CV: Negative for chest pain, angina, palpitations, dyspnea on exertion, peripheral edema.  Respiratory: Negative for dyspnea at rest, dyspnea on exertion, cough, sputum, wheezing.  GI: See history of present illness. GU:  Negative for dysuria, hematuria, urinary incontinence, urinary frequency, nocturnal urination.  MS: Negative for joint pain, low back pain.  Derm: Negative for rash or itching.  Neuro: Negative for weakness, seizure, frequent headaches, memory loss, confusion. Chronic peripheral neuropathy.  Psych: Negative for anxiety, depression, suicidal ideation, hallucinations.  Endo: Negative for unusual weight change.  Heme: Negative for bruising or bleeding. Allergy: Negative for rash or hives.    Physical Examination:  BP 120/80  Pulse 53  Temp(Src) 97.6 F (36.4 C) (Temporal)  Ht 5' 2.5" (1.588 m)  Wt 172 lb 6.4 oz (78.2 kg)  BMI 31.03 kg/m2   General: Well-nourished, well-developed in no acute distress.  Head: Normocephalic, atraumatic.   Eyes: Conjunctiva pink, no icterus. Mouth: Oropharyngeal mucosa moist and pink , no lesions erythema or exudate. Neck: Supple without thyromegaly, masses, or lymphadenopathy.  Lungs: Clear to auscultation bilaterally.  Heart: Regular rate and rhythm, no murmurs rubs or  gallops.  Abdomen: Bowel sounds are normal, nontender, nondistended, no hepatosplenomegaly or masses, no abdominal bruits or hernia , no rebound or guarding.   Rectal: not performed Extremities: No lower extremity edema. No clubbing or deformities.  Neuro: Alert and oriented x 4 , grossly normal neurologically.  Skin: Warm and dry, no rash or jaundice.   Psych: Alert and cooperative, normal mood and affect.

## 2011-09-25 ENCOUNTER — Encounter: Payer: Self-pay | Admitting: Internal Medicine

## 2012-04-10 ENCOUNTER — Emergency Department (HOSPITAL_COMMUNITY): Payer: Medicare Other

## 2012-04-10 ENCOUNTER — Emergency Department (HOSPITAL_COMMUNITY)
Admission: EM | Admit: 2012-04-10 | Discharge: 2012-04-10 | Disposition: A | Discharge status: Home / Self Care | Payer: Medicare Other | Attending: Emergency Medicine | Admitting: Emergency Medicine

## 2012-04-10 ENCOUNTER — Encounter (HOSPITAL_COMMUNITY): Payer: Self-pay

## 2012-04-10 DIAGNOSIS — E039 Hypothyroidism, unspecified: Secondary | ICD-10-CM | POA: Insufficient documentation

## 2012-04-10 DIAGNOSIS — G609 Hereditary and idiopathic neuropathy, unspecified: Secondary | ICD-10-CM | POA: Insufficient documentation

## 2012-04-10 DIAGNOSIS — Y929 Unspecified place or not applicable: Secondary | ICD-10-CM | POA: Insufficient documentation

## 2012-04-10 DIAGNOSIS — S32009A Unspecified fracture of unspecified lumbar vertebra, initial encounter for closed fracture: Secondary | ICD-10-CM | POA: Insufficient documentation

## 2012-04-10 DIAGNOSIS — W1809XA Striking against other object with subsequent fall, initial encounter: Secondary | ICD-10-CM | POA: Insufficient documentation

## 2012-04-10 DIAGNOSIS — S32020A Wedge compression fracture of second lumbar vertebra, initial encounter for closed fracture: Secondary | ICD-10-CM

## 2012-04-10 DIAGNOSIS — Z8709 Personal history of other diseases of the respiratory system: Secondary | ICD-10-CM | POA: Insufficient documentation

## 2012-04-10 DIAGNOSIS — Z79899 Other long term (current) drug therapy: Secondary | ICD-10-CM | POA: Insufficient documentation

## 2012-04-10 DIAGNOSIS — Y9389 Activity, other specified: Secondary | ICD-10-CM | POA: Insufficient documentation

## 2012-04-10 MED ORDER — HYDROCODONE-ACETAMINOPHEN 5-325 MG PO TABS
1.0000 | ORAL_TABLET | Freq: Once | ORAL | Status: AC
  Administered 2012-04-10: 1 via ORAL
  Filled 2012-04-10: qty 1

## 2012-04-10 NOTE — ED Provider Notes (Signed)
History    This chart was scribed for Donnetta Hutching, MD, MD by Smitty Pluck. The patient was seen in room APA04 and the patient's care was started at 12:33PM.   CSN: [id number]  Arrival date & time 04/10/12  1151       Chief Complaint  Patient presents with  . Fall  . Back Pain    (Consider location/radiation/quality/duration/timing/severity/associated sxs/prior treatment) Patient is a 78 y.o. female presenting with fall and back pain. The history is provided by the patient. No language interpreter was used.  Fall  Back Pain    [name] is a 78 y.o. female who presents to the Emergency Department BIB son complaining of constant, moderate lower back pain onset today. Pt reports that she got up out of bed 3 days ago and hit herself during fall on desk and chair. She did not notice any pain at the time of fall. Pt is unsure of how she fell. She states walking aggravates the pain. Denies head pain, neck pain, hip pain and any other pain. Pt went to chiropractor today but did not have xrays taken.   Past Medical History  Diagnosis Date  . Hypothyroidism   . Peripheral neuropathy   . Shortness of breath     Past Surgical History  Procedure Date  . Esophagogastroduodenoscopy 10/24/08    normal, status post [facility] dilator, small hiatal hernia deformity of the antrum/pylorus  . Colonoscopy 10/24/08    anal papilla otherwise normal, pan colonic diverticulum and colonic mucosa  . Appendectomy 1941  . Abdominal hysterectomy 1977  . Cholecystectomy 1967  . Spine surgery 1986  . Knee surgery 09/2008  . Hemorrhoid surgery 1999  . Colonoscopy remote    Dr. Jerolyn Shin Smith-->polpys per patient  . Ercp 1995    Dr. Rourk--> for dilated biliary tree. no stone or neoplasm or stricture found  . Esophagogastroduodenoscopy 1995    Dr. Rourk--> empiric dilation of esophagus with resolution of dysphagia    Family History  Problem Relation Age of Onset  . Heart attack Father 36    deceased     History  Substance Use Topics  . Smoking status: Never Smoker   . Smokeless tobacco: Not on file  . Alcohol Use: No    OB History    Grav Para Term Preterm Abortions TAB SAB Ect Mult Living                  Review of Systems  All other systems reviewed and are negative.  10 Systems reviewed and all are negative for acute change except as noted in the HPI.    Allergies  Lactulose and Penicillins  Home Medications   Current Outpatient Rx  Name Route Sig Dispense Refill  . ESOMEPRAZOLE MAGNESIUM 40 MG PO CPDR Oral Take 1 capsule (40 mg total) by mouth daily. 10 capsule 0  . GABAPENTIN 300 MG PO CAPS Oral Take 300 mg by mouth 3 (three) times daily.    Marland Kitchen LEVOTHYROXINE SODIUM 50 MCG PO TABS Oral Take 50 mcg by mouth daily.      BP 165/66  Pulse 77  Temp 97.9 F (36.6 C) (Oral)  Resp 20  Ht 5\' 2"  (1.575 m)  Wt 187 lb (84.823 kg)  BMI 34.20 kg/m2  SpO2 97%  Physical Exam  Nursing note and vitals reviewed. Constitutional: She is oriented to person, place, and time. She appears well-developed and well-nourished.  HENT:  Head: Normocephalic and atraumatic.  Eyes: Conjunctivae normal  and EOM are normal. Pupils are equal, round, and reactive to light.  Neck: Normal range of motion. Neck supple.  Cardiovascular: Normal rate, regular rhythm and normal heart sounds.   Pulmonary/Chest: Effort normal and breath sounds normal.  Abdominal: Soft. Bowel sounds are normal.  Musculoskeletal: Normal range of motion. She exhibits no edema.       Tenderness of lumbar   Neurological: She is alert and oriented to person, place, and time.  Skin: Skin is warm and dry.  Psychiatric: She has a normal mood and affect.    ED Course  Procedures (including critical care time) DIAGNOSTIC STUDIES: Oxygen Saturation is 97% on room air, normal by my interpretation.    COORDINATION OF CARE: 12:37 PM Discussed ED treatment with pt (xray of back) 1:02 PM Ordered:    .  HYDROcodone-acetaminophen  1 tablet Oral Once      Date: 04/10/2012  Rate: 50  Rhythm: sinus bradycardia  QRS Axis: normal  Intervals: normal  ST/T Wave abnormalities: normal  Conduction Disutrbances:left anterior fascicular block, 1st degree AV block  Narrative Interpretation:   Old EKG Reviewed: changes noted  Dg Lumbar Spine Complete  04/10/2012  *RADIOLOGY REPORT*  Clinical Data: Recent fall, low back pain  LUMBAR SPINE - COMPLETE 4+ VIEW  Comparison: Lumbar spine films of 12/19/2009  Findings: An 8 mm anterolisthesis of L4 on L5 appears relatively stable and most likely due to degenerative change.  A partial compression deformity of L2 vertebral body appears stable.  No new compression deformity is seen.  The bones are very osteopenic.  IMPRESSION: Osteopenia and degenerative change.  No acute compression deformity.   Original Report Authenticated By: Juline Patch, M.D.    Dg Sacrum/coccyx  04/10/2012  *RADIOLOGY REPORT*  Clinical Data: Recent fall with low back pain  SACRUM AND COCCYX - 2+ VIEW  Comparison: Lumbar spine films of 12/19/2009  Findings: The sacral coccygeal elements appear to be in normal alignment.  The sacral foramina are corticated.  The SI joints appear normal.  The pelvic rami are intact.  IMPRESSION: No acute fracture.   Original Report Authenticated By: Juline Patch, M.D.     No results found.   No diagnosis found.    MDM  Status post accidental fall, not syncopal spell.  Patient alert without neurological deficits. Plain films of lumbar, sacrum and coccyx show no acute findings   I personally performed the services described in this documentation, which was scribed in my presence. The recorded information has been reviewed and considered.       Donnetta Hutching, MD 04/10/12 1510

## 2012-04-10 NOTE — ED Notes (Signed)
Dr. Adriana Simas in room talking with pt and family

## 2012-04-10 NOTE — ED Notes (Signed)
Pt returned from xray

## 2012-04-10 NOTE — ED Notes (Signed)
Instructions reviewed and f/u information provided.  Verbalizes understanding. Alert, in no apparent distress, reports pain with movement (10/10).

## 2012-04-10 NOTE — ED Notes (Signed)
Pt states that she fell Friday morning around 2am, unsure of what caused her to fall, pt fell hitting her lower back against the dresser and rocking chair, has been able to walk since then and manage the pain until today when the pain became worse after bending over, pain increased with movement, located in lower back area, Dr. Adriana Simas at bedside,

## 2012-04-10 NOTE — ED Notes (Signed)
Pt reports woke up around 2 am Saturday morning and fell on her way to the bathroom.  PT says doesn't know what caused her to fall and doesn't know if she passed out or not.  PT says her son helped her get up, pt used the bathroom and then went back to bed.  Pt says when she stood up to get off of the commode she felt a "catch" in her lower back and has been hurting since.   Reports felt dizzy after bending over this morning for a brief time but didn't last long.  Denies any other dizzy spells or feeling light headed.

## 2012-06-18 ENCOUNTER — Encounter: Payer: Self-pay | Admitting: Internal Medicine

## 2012-06-19 ENCOUNTER — Encounter: Payer: Self-pay | Admitting: Internal Medicine

## 2012-06-19 ENCOUNTER — Ambulatory Visit (INDEPENDENT_AMBULATORY_CARE_PROVIDER_SITE_OTHER): Payer: Medicare Other | Admitting: Gastroenterology

## 2012-06-19 ENCOUNTER — Encounter: Payer: Self-pay | Admitting: Gastroenterology

## 2012-06-19 VITALS — BP 120/70 | HR 75 | Temp 97.6°F | Ht 62.0 in | Wt 169.2 lb

## 2012-06-19 DIAGNOSIS — R1310 Dysphagia, unspecified: Secondary | ICD-10-CM

## 2012-06-19 MED ORDER — OMEPRAZOLE 20 MG PO CPDR: 20.0000 mg | DELAYED_RELEASE_CAPSULE | Freq: Every day | ORAL | Status: DC

## 2012-06-19 NOTE — Patient Instructions (Addendum)
Start taking Prilosec 20 mg each morning, 30 minutes before your breakfast. Take this for one month. I have provided samples.   Please complete the xray we have ordered. We will call you with the results.  We may need to refer you to see a speech therapist, or we may need to do another procedure. It depends on the results of the xray.   We will see you back in 3 months.

## 2012-06-19 NOTE — Progress Notes (Signed)
Referring Provider: [name], MD Primary Care Physician:  [name], MD Primary Gastroenterologist: Dr. Jena Gauss   Chief Complaint  Patient presents with  . Dysphagia    HPI:   79 year old female with with hx of dysphagia, s/p several dilations in the past, with the most recent in April 2013 by Dr. Jena Gauss. At that time, EGD with non-critical Schatzki's ring s/p dilation, small hiatal hernia, pathology with benign esophageal squamous mucosa with mild changes of reflux. She returns today with continued complaints of dysphagia. She states she doesn't remember if she was better after her last dilation. She continues to talk about a tick and an infection. Said she went to so many doctors she can't remember which one she went to.  States she was told she is hoarse, mucus comes up into throat, makes her hoarse. No odynophagia. Trouble with oatmeal. Had trouble with an apple the other day. Points to cervical esophagus as the area of issue. Good appetite. No reflux symptoms. Not on a PPI. Pt states she doesn't think she is hoarse. Son present, doesn't notice a difference.     April 2013 172 Oct 2013  187 Jan 2014   169   Past Medical History  Diagnosis Date  . Hypothyroidism   . Peripheral neuropathy   . Shortness of breath     Past Surgical History  Procedure Date  . Esophagogastroduodenoscopy 10/24/08    normal, status post [facility] dilator, small hiatal hernia deformity of the antrum/pylorus  . Colonoscopy 10/24/08    anal papilla otherwise normal, pan colonic diverticulum and colonic mucosa  . Appendectomy 1941  . Abdominal hysterectomy 1977  . Cholecystectomy 1967  . Spine surgery 1986  . Knee surgery 09/2008  . Hemorrhoid surgery 1999  . Colonoscopy remote    Dr. Jerolyn Shin Smith-->polpys per patient  . Ercp 1995    Dr. Rourk--> for dilated biliary tree. no stone or neoplasm or stricture found  . Esophagogastroduodenoscopy 1995    Dr. Rourk--> empiric dilation of esophagus  with resolution of dysphagia  . Esophagogastroduodenoscopy  09/21/2011    RMR: Noncritical Schatzki's ring s/p dilated/Abnormal esophageal mucosa/Small  hiatal hermia    Current Outpatient Prescriptions  Medication Sig Dispense Refill  . acetaminophen (TYLENOL) 500 MG tablet Take 1,000 mg by mouth at bedtime.      . gabapentin (NEURONTIN) 300 MG capsule Take 300 mg by mouth 3 (three) times daily.      Marland Kitchen HYDROcodone-acetaminophen (NORCO/VICODIN) 5-325 MG per tablet Take 1 tablet by mouth every 8 (eight) hours as needed.       Marland Kitchen levothyroxine (SYNTHROID, LEVOTHROID) 50 MCG tablet Take 50 mcg by mouth daily.        Allergies as of 06/19/2012 - Review Complete 06/19/2012  Allergen Reaction Noted  . Lactulose    . Penicillins      Family History  Problem Relation Age of Onset  . Heart attack Father 59    deceased    History   Social History  . Marital Status: Widowed    Spouse Name: N/A    Number of Children: 3  . Years of Education: N/A   Occupational History  . Retired Neurosurgeon    Social History Main Topics  . Smoking status: Never Smoker   . Smokeless tobacco: None  . Alcohol Use: No  . Drug Use: No  . Sexually Active: None   Other Topics Concern  . None   Social History Narrative  . None    Review  of Systems: Gen: Denies fever, chills, anorexia. Denies fatigue, weakness, weight loss.  CV: Denies chest pain, palpitations, syncope, peripheral edema, and claudication. Resp: Denies dyspnea at rest, cough, wheezing, coughing up blood, and pleurisy. GI: SEE HPI Derm: Denies rash, itching, dry skin MSK: right knee discomfort Psych: +memory loss Heme: Denies bruising, bleeding, and enlarged lymph nodes.  Physical Exam: BP 120/70  Pulse 75  Temp 97.6 F (36.4 C) (Oral)  Ht 5\' 2"  (1.575 m)  Wt 169 lb 3.2 oz (76.749 kg)  BMI 30.95 kg/m2 General:   Alert and oriented to person, place, reoriented to year quickly. No distress noted. Pleasant and cooperative.  Appears younger than stated age.  Head:  Normocephalic and atraumatic. Eyes:  Conjuctiva clear without scleral icterus. Neck:  Supple, without mass or thyromegaly. Heart:  S1, S2 present without murmurs, rubs, or gallops. Regular rate and rhythm. Abdomen:  +BS, soft, non-tender and non-distended. No rebound or guarding. No HSM or masses noted. Query ventral hernia.  Msk:  Symmetrical without gross deformities. Normal posture. Extremities:  Without edema. Neurologic:  Alert and  oriented to person and situation.  Skin:  Intact without significant lesions or rashes. Cervical Nodes:  No significant cervical adenopathy. Psych:  Alert and cooperative. Normal mood and affect.

## 2012-06-19 NOTE — Progress Notes (Signed)
Faxed to PCP

## 2012-06-19 NOTE — Assessment & Plan Note (Signed)
79 year old female with continued dysphagia despite dilation in April 2013; she is unable to tell me if she noticed improvement with the last dilation. Her dysphagia almost seems to have an oropharyngeal component. I question an underlying motility disorder. She may also be having intermittent reflux. Will trial Prilosec 20 mg daily X 1 month, order UGI/BPE to assess for Schatzki's ring or other etiology such as motility disorder. Consider EGD if needed; I anticipate speech evaluation necessary in the near future.

## 2012-06-25 ENCOUNTER — Ambulatory Visit (HOSPITAL_COMMUNITY)
Admission: RE | Admit: 2012-06-25 | Discharge: 2012-06-25 | Disposition: A | Discharge status: Home / Self Care | Payer: Medicare Other | Source: Ambulatory Visit | Attending: Gastroenterology | Admitting: Gastroenterology

## 2012-06-25 DIAGNOSIS — R1310 Dysphagia, unspecified: Secondary | ICD-10-CM

## 2012-06-25 DIAGNOSIS — K224 Dyskinesia of esophagus: Secondary | ICD-10-CM | POA: Insufficient documentation

## 2012-06-25 DIAGNOSIS — R1013 Epigastric pain: Secondary | ICD-10-CM | POA: Insufficient documentation

## 2012-06-28 NOTE — Progress Notes (Signed)
Quick Note:  As expected, UGI shows age-related motility issues.  No evidence of stricture, mass, obstruction of tablet.  How is pt doing since starting Prilosec daily?  Let's have her return in about 6 weeks. Continue Prilosec daily. ______

## 2012-09-14 ENCOUNTER — Ambulatory Visit (INDEPENDENT_AMBULATORY_CARE_PROVIDER_SITE_OTHER): Payer: Medicare Other | Admitting: Internal Medicine

## 2012-09-14 ENCOUNTER — Encounter: Payer: Self-pay | Admitting: Internal Medicine

## 2012-09-14 VITALS — BP 140/80 | HR 56 | Temp 98.2°F | Ht 62.0 in | Wt 172.2 lb

## 2012-09-14 DIAGNOSIS — R1314 Dysphagia, pharyngoesophageal phase: Secondary | ICD-10-CM

## 2012-09-14 NOTE — Progress Notes (Signed)
p 

## 2012-09-14 NOTE — Progress Notes (Signed)
Primary Care Physician:  [name], MD Primary Gastroenterologist:  Dr. Jena Gauss  Pre-Procedure History & Physical: HPI:  [name] is a 79 y.o. female here for followup for dysphagia. Still he having some transfer/proximal esophageal dysphagia symptoms. Noncritical Schatzki's ring  - otherwise no obstruction at prior EGD; empirically dilated her esophagus up to a 56 [place] size with [facility] dilators. Esophageal dysmotility on barium pill esophagram. No obstruction to passage of the barium pill. Has come off acid suppression therapy along the way. Patient does have some throat clearing. No hoarseness noted per family members.  Feels that the food will not go down when she swallows 3 mouthfuls. Says she takes her time and has liquids on hand to help with swallowing. No abdominal pain nausea or vomiting. She's actually gained 3 pounds since her last office visit here.  Past Medical History  Diagnosis Date  . Hypothyroidism   . Peripheral neuropathy   . Shortness of breath   . Hiatal hernia   . Schatzki's ring     Past Surgical History  Procedure Laterality Date  . Esophagogastroduodenoscopy  10/24/08    normal, status post [facility] dilator, small hiatal hernia deformity of the antrum/pylorus  . Colonoscopy  10/24/08    anal papilla otherwise normal, pan colonic diverticulum and colonic mucosa  . Appendectomy  1941  . Abdominal hysterectomy  1977  . Cholecystectomy  1967  . Spine surgery  1986  . Knee surgery  09/2008  . Hemorrhoid surgery  1999  . Colonoscopy  remote    Dr. Jerolyn Shin Smith-->polpys per patient  . Ercp  1995    Dr. Neri Samek--> for dilated biliary tree. no stone or neoplasm or stricture found  . Esophagogastroduodenoscopy  1995    Dr. Walton Digilio--> empiric dilation of esophagus with resolution of dysphagia  . Esophagogastroduodenoscopy   09/21/2011    RMR: Noncritical Schatzki's ring s/p dilated/Abnormal esophageal mucosa/Small  hiatal hermia    Prior to Admission medications    Medication Sig Start Date End Date Taking? Authorizing Provider  acetaminophen (TYLENOL) 500 MG tablet Take 1,000 mg by mouth at bedtime.   Yes Historical Provider, MD  gabapentin (NEURONTIN) 300 MG capsule Take 300 mg by mouth 3 (three) times daily.   Yes Historical Provider, MD  HYDROcodone-acetaminophen (NORCO/VICODIN) 5-325 MG per tablet Take 1 tablet by mouth every 8 (eight) hours as needed.  04/11/12  Yes Historical Provider, MD  levothyroxine (SYNTHROID, LEVOTHROID) 50 MCG tablet Take 50 mcg by mouth daily.   Yes Historical Provider, MD  omeprazole (PRILOSEC) 20 MG capsule Take 1 capsule (20 mg total) by mouth daily. 30 minutes before breakfast 06/19/12  Yes Nira Retort, NP    Allergies as of 09/14/2012 - Review Complete 09/14/2012  Allergen Reaction Noted  . Lactulose    . Penicillins      Family History  Problem Relation Age of Onset  . Heart attack Father 53    deceased    History   Social History  . Marital Status: Widowed    Spouse Name: N/A    Number of Children: 3  . Years of Education: N/A   Occupational History  . Retired Neurosurgeon    Social History Main Topics  . Smoking status: Never Smoker   . Smokeless tobacco: Not on file  . Alcohol Use: No  . Drug Use: No  . Sexually Active: Not on file   Other Topics Concern  . Not on file   Social History Narrative  .  No narrative on file    Review of Systems: See HPI, otherwise negative ROS  Physical Exam: BP 140/80  Pulse 56  Temp(Src) 98.2 F (36.8 C) (Oral)  Ht 5\' 2"  (1.575 m)  Wt 172 lb 3.2 oz (78.109 kg)  BMI 31.49 kg/m2 General:   Alert,  well oriented conversant pleasant and cooperative in NAD. She is accompanied by her son Skin:  Intact without significant lesions or rashes. Eyes:  Sclera clear, no icterus.   Conjunctiva pink. Ears:  Normal auditory acuity. Nose:  No deformity, discharge,  or lesions. Mouth:  No deformity or lesions. Neck:  Supple; no masses or thyromegaly. No significant  cervical adenopathy. Lungs:  Clear throughout to auscultation.   No wheezes, crackles, or rhonchi. No acute distress. Heart:  Regular rate and rhythm; no murmurs, clicks, rubs,  or gallops. Abdomen: Non-distended, normal bowel sounds.  Soft and nontender without appreciable mass or hepatosplenomegaly.  Pulses:  Normal pulses noted. Extremities:  Without clubbing or edema.  Impression/Plan:  79 year old lady with some transfer/ proximal esophageal  dysphagia symptoms. She's come off of acid suppression therapy.  No structural lesion on barium pill esophagram. No Zenker's, etc. Likely has esophageal dysmotility to account for her symptoms. It may be that a speech evaluation might give [name] some further insight (and  help) with managing her symptoms. Throat clearing may or may not have anything to do with reflux  Recommendations:    Speech pathology evaluation. Resume Prilosec 20 mg daily empirically. I feel the benefits would outweigh the risks. Office visit here in 3 months. Swallowing precautions were reviewed with the patient and son

## 2012-09-14 NOTE — Patient Instructions (Addendum)
Consultation with speech pathologist to assist with swallowing  Resume Prilosec 20 mg daily  Office visit 3 months  CC: PCP  Referral to speech pathology has been made

## 2012-09-27 ENCOUNTER — Ambulatory Visit (HOSPITAL_COMMUNITY)
Admission: RE | Admit: 2012-09-27 | Discharge: 2012-09-27 | Disposition: A | Discharge status: Home / Self Care | Payer: Medicare Other | Source: Ambulatory Visit | Attending: Internal Medicine | Admitting: Internal Medicine

## 2012-09-27 ENCOUNTER — Telehealth: Payer: Self-pay

## 2012-09-27 DIAGNOSIS — R1310 Dysphagia, unspecified: Secondary | ICD-10-CM | POA: Insufficient documentation

## 2012-09-27 DIAGNOSIS — IMO0001 Reserved for inherently not codable concepts without codable children: Secondary | ICD-10-CM | POA: Insufficient documentation

## 2012-09-27 MED ORDER — PANTOPRAZOLE SODIUM 40 MG PO TBEC: 40.0000 mg | DELAYED_RELEASE_TABLET | Freq: Every day | ORAL | Status: DC

## 2012-09-27 NOTE — Telephone Encounter (Signed)
Speech therapy called- they are seeing pt today and pt informed them that she took the prilosec x 3 days and broke out into a rash and was itchy. So she stopped taking it. Speech therapy agrees that pt needs to be on ppi and wanted to know if we could change her to a different ppi. Pt uses [facility].   I have added prilosec to pts allergies.  Please advise.

## 2012-09-27 NOTE — Progress Notes (Signed)
Clinical/Bedside Swallow Evaluation Patient Details  Name: [name] MRN: [id number] Date of Birth: 1920/10/28  Today's Date: 09/27/2012 Time:  1:00 - 1:45 PM    Past Medical History:  Past Medical History  Diagnosis Date  . Hypothyroidism   . Peripheral neuropathy   . Shortness of breath   . Hiatal hernia   . Schatzki's ring    Past Surgical History:  Past Surgical History  Procedure Laterality Date  . Esophagogastroduodenoscopy  10/24/08    normal, status post [facility] dilator, small hiatal hernia deformity of the antrum/pylorus  . Colonoscopy  10/24/08    anal papilla otherwise normal, pan colonic diverticulum and colonic mucosa  . Appendectomy  1941  . Abdominal hysterectomy  1977  . Cholecystectomy  1967  . Spine surgery  1986  . Knee surgery  09/2008  . Hemorrhoid surgery  1999  . Colonoscopy  remote    Dr. Jerolyn Shin [name]-->polpys per patient  . Ercp  1995    Dr. Rourk--> for dilated biliary tree. no stone or neoplasm or stricture found  . Esophagogastroduodenoscopy  1995    Dr. Rourk--> empiric dilation of esophagus with resolution of dysphagia  . Esophagogastroduodenoscopy   09/21/2011    RMR: Noncritical Schatzki's ring s/p dilated/Abnormal esophageal mucosa/Small  hiatal hermia    Prior Functional Status  Cognitive/Linguistic Baseline: Baseline deficits Baseline deficit details: Mild memory impairment Type of Home: House Lives With: Son Available Help at Discharge: Family Vocation: Retired  General  Date of Onset: 06/13/12 HPI: Mrs. [name] is a 79 yo woman who was referred by Dr. Jena [name] and [name] for education regarding swallowing and reflux precautions. She was accompanied to the appointment by her son who assisted in providing background information as [name] thought she was here for "speech help" to increase intelligibility. She had EGD about a year ago with empiric dilation and barium swallow in January which showed age related diffuse esophageal  dysmotility and prominent cricopharyngeus. Pt complains of hoarse vocal quality and globus sensation in throat. She started on Prilosec a few days ago, but discontinued due to rash.  Type of Study: Bedside swallow evaluation Diet Prior to this Study: Regular;Thin liquids Temperature Spikes Noted: No Respiratory Status: Room air History of Recent Intubation: No Behavior/Cognition: Alert;Cooperative;Pleasant mood;Hard of hearing Oral Cavity - Dentition: Adequate natural dentition Self-Feeding Abilities: Able to feed self Patient Positioning: Upright in chair Baseline Vocal Quality: Clear Volitional Cough: Strong Volitional Swallow: Able to elicit  Oral Motor/Sensory Function  Overall Oral Motor/Sensory Function: Appears within functional limits for tasks assessed  Consistency Results  Ice Chips Ice chips: Not tested  Thin Liquid Thin Liquid: Within functional limits Presentation: Cup  Nectar Thick Liquid Nectar Thick Liquid: Not tested  Honey Thick Liquid Honey Thick Liquid: Not tested  Puree Puree: Not tested    Assessment/Plan  Patient Active Problem List  Diagnosis  . HYPOTHYROIDISM  . ABDOMINAL PAIN, GENERALIZED  . Personal history of other diseases of digestive disease  . Esophageal dysphagia  . Odynophagia  . Hoarseness  . Dysphagia   SLP - End of Session Activity Tolerance: Patient tolerated treatment well General Behavior During Therapy: WFL for tasks assessed/performed Cognition: WFL for tasks performed  SLP Assessment/Plan Clinical Impression Statement: [name] shows no overt signs/symptoms of aspiration during clinical exam and tolerated graham crackers and water via cup sips without incident. I reviewed the results of her previous esophageal studies with her and discussed strategies to reduce globus sensation and what she  perceives as hoarseness. Her symtoms to [name] towards reflux, although she denies burning sensation and doesn't think it could be  reflux because of that. She did not tolerate Prilosec (broke out in a rash) and I spoke with Dr. Luvenia [name] nurse who will relay message to [employer]. Her voice does not sound hoarse to me, but does have a rough quality. She clears her throat frequently so this likely contributes to change in vocal quality. She is going to try another PPI (per [name]/Dr. Jena [name] discretion), elevate the head of her bed with bricks, and practice good vocal hygiene and reflux strategies as able to see if this help relieve her symptoms. Pt was given my phone # to follow up with me in a few weeks to see how things are going. I encouraged her to try the PPI for at least 8 weeks to see if it makes a difference (she does not like taking medication unless absolutely necessary).  G-Code: Swallowing, initial: CI, goal: CI, discharge: CI  Thank you,  Havery Moros, CCC-SLP [phone]  PORTER,DABNEY 09/27/2012,7:00 PM

## 2012-09-27 NOTE — Telephone Encounter (Signed)
Trial protonix. Stop immediately if any rash.

## 2012-11-09 ENCOUNTER — Encounter (HOSPITAL_COMMUNITY): Payer: Self-pay | Admitting: *Deleted

## 2012-11-09 ENCOUNTER — Emergency Department (HOSPITAL_COMMUNITY): Payer: Medicare Other

## 2012-11-09 ENCOUNTER — Inpatient Hospital Stay (HOSPITAL_COMMUNITY)
Admission: EM | Admit: 2012-11-09 | Discharge: 2012-11-12 | DRG: 690 | Disposition: A | Discharge status: Home / Self Care | Payer: Medicare Other | Attending: Family Medicine | Admitting: Family Medicine

## 2012-11-09 DIAGNOSIS — G609 Hereditary and idiopathic neuropathy, unspecified: Secondary | ICD-10-CM | POA: Diagnosis present

## 2012-11-09 DIAGNOSIS — W19XXXA Unspecified fall, initial encounter: Secondary | ICD-10-CM

## 2012-11-09 DIAGNOSIS — E860 Dehydration: Secondary | ICD-10-CM | POA: Diagnosis present

## 2012-11-09 DIAGNOSIS — N390 Urinary tract infection, site not specified: Principal | ICD-10-CM | POA: Diagnosis present

## 2012-11-09 DIAGNOSIS — E039 Hypothyroidism, unspecified: Secondary | ICD-10-CM | POA: Diagnosis present

## 2012-11-09 DIAGNOSIS — F0390 Unspecified dementia without behavioral disturbance: Secondary | ICD-10-CM | POA: Diagnosis present

## 2012-11-09 DIAGNOSIS — Z9181 History of falling: Secondary | ICD-10-CM

## 2012-11-09 DIAGNOSIS — I10 Essential (primary) hypertension: Secondary | ICD-10-CM | POA: Diagnosis present

## 2012-11-09 DIAGNOSIS — Z880 Allergy status to penicillin: Secondary | ICD-10-CM

## 2012-11-09 DIAGNOSIS — Z8249 Family history of ischemic heart disease and other diseases of the circulatory system: Secondary | ICD-10-CM

## 2012-11-09 DIAGNOSIS — F05 Delirium due to known physiological condition: Secondary | ICD-10-CM | POA: Diagnosis present

## 2012-11-09 HISTORY — DX: Essential (primary) hypertension: I10

## 2012-11-09 LAB — CBC WITH DIFFERENTIAL/PLATELET
Eosinophils Absolute: 0 10*3/uL (ref 0.0–0.7)
Eosinophils Relative: 0 % (ref 0–5)
HCT: 41.2 % (ref 36.0–46.0)
Hemoglobin: 13.6 g/dL (ref 12.0–15.0)
Lymphocytes Relative: 7 % — ABNORMAL LOW (ref 12–46)
Lymphs Abs: 1 10*3/uL (ref 0.7–4.0)
MCH: 29.7 pg (ref 26.0–34.0)
MCV: 90 fL (ref 78.0–100.0)
Monocytes Relative: 8 % (ref 3–12)
RBC: 4.58 MIL/uL (ref 3.87–5.11)

## 2012-11-09 LAB — URINE MICROSCOPIC-ADD ON

## 2012-11-09 LAB — URINALYSIS, ROUTINE W REFLEX MICROSCOPIC
Bilirubin Urine: NEGATIVE
Glucose, UA: NEGATIVE mg/dL
Specific Gravity, Urine: 1.02 (ref 1.005–1.030)
Urobilinogen, UA: 0.2 mg/dL (ref 0.0–1.0)
pH: 6 (ref 5.0–8.0)

## 2012-11-09 LAB — COMPREHENSIVE METABOLIC PANEL
Alkaline Phosphatase: 48 U/L (ref 39–117)
BUN: 17 mg/dL (ref 6–23)
CO2: 26 mEq/L (ref 19–32)
Calcium: 9.4 mg/dL (ref 8.4–10.5)
GFR calc Af Amer: 60 mL/min — ABNORMAL LOW (ref >90)
GFR calc non Af Amer: 52 mL/min — ABNORMAL LOW (ref >90)
Glucose, Bld: 153 mg/dL — ABNORMAL HIGH (ref 70–99)
Total Protein: 6.8 g/dL (ref 6.0–8.3)

## 2012-11-09 LAB — LIPASE, BLOOD: Lipase: 12 U/L (ref 11–59)

## 2012-11-09 MED ORDER — SODIUM CHLORIDE 0.9 % IV SOLN: INTRAVENOUS | Status: DC

## 2012-11-09 MED ORDER — ACETAMINOPHEN 500 MG PO TABS
1000.0000 mg | ORAL_TABLET | Freq: Every day | ORAL | Status: DC
  Administered 2012-11-10 – 2012-11-11 (×3): 1000 mg via ORAL
  Filled 2012-11-09 (×3): qty 2

## 2012-11-09 MED ORDER — ONDANSETRON HCL 4 MG/2ML IJ SOLN: 4.0000 mg | INTRAMUSCULAR | Status: DC | PRN

## 2012-11-09 MED ORDER — HALOPERIDOL LACTATE 5 MG/ML IJ SOLN: 2.5000 mg | Freq: Four times a day (QID) | INTRAMUSCULAR | Status: DC | PRN

## 2012-11-09 MED ORDER — LEVOTHYROXINE SODIUM 50 MCG PO TABS
50.0000 ug | ORAL_TABLET | Freq: Every day | ORAL | Status: DC
  Administered 2012-11-10 – 2012-11-11 (×2): 50 ug via ORAL
  Filled 2012-11-09 (×2): qty 1

## 2012-11-09 MED ORDER — SODIUM CHLORIDE 0.9 % IV SOLN
INTRAVENOUS | Status: DC
  Administered 2012-11-10 – 2012-11-11 (×5): via INTRAVENOUS

## 2012-11-09 MED ORDER — FLEET ENEMA 7-19 GM/118ML RE ENEM: 1.0000 | ENEMA | Freq: Once | RECTAL | Status: AC | PRN

## 2012-11-09 MED ORDER — SODIUM CHLORIDE 0.9 % IV BOLUS (SEPSIS)
250.0000 mL | Freq: Once | INTRAVENOUS | Status: AC
  Administered 2012-11-09: 250 mL via INTRAVENOUS

## 2012-11-09 MED ORDER — GABAPENTIN 300 MG PO CAPS
300.0000 mg | ORAL_CAPSULE | Freq: Three times a day (TID) | ORAL | Status: DC
  Administered 2012-11-10 – 2012-11-11 (×7): 300 mg via ORAL
  Filled 2012-11-09 (×7): qty 1

## 2012-11-09 MED ORDER — CIPROFLOXACIN IN D5W 400 MG/200ML IV SOLN
400.0000 mg | Freq: Once | INTRAVENOUS | Status: AC
  Administered 2012-11-09: 400 mg via INTRAVENOUS
  Filled 2012-11-09: qty 200

## 2012-11-09 MED ORDER — PANTOPRAZOLE SODIUM 40 MG PO TBEC
40.0000 mg | DELAYED_RELEASE_TABLET | Freq: Every day | ORAL | Status: DC
  Administered 2012-11-10 – 2012-11-11 (×2): 40 mg via ORAL
  Filled 2012-11-09 (×2): qty 1

## 2012-11-09 MED ORDER — CIPROFLOXACIN IN D5W 400 MG/200ML IV SOLN
400.0000 mg | Freq: Two times a day (BID) | INTRAVENOUS | Status: DC
  Administered 2012-11-10 – 2012-11-11 (×4): 400 mg via INTRAVENOUS
  Filled 2012-11-09 (×5): qty 200

## 2012-11-09 MED ORDER — POLYETHYLENE GLYCOL 3350 17 G PO PACK
17.0000 g | PACK | Freq: Every day | ORAL | Status: DC | PRN
Start: 2012-11-09 — End: 2012-11-12

## 2012-11-09 MED ORDER — BISACODYL 10 MG RE SUPP: 10.0000 mg | Freq: Every day | RECTAL | Status: DC | PRN

## 2012-11-09 MED ORDER — ENOXAPARIN SODIUM 40 MG/0.4ML ~~LOC~~ SOLN
40.0000 mg | SUBCUTANEOUS | Status: DC
Start: 2012-11-09 — End: 2012-11-12
  Administered 2012-11-10 – 2012-11-11 (×3): 40 mg via SUBCUTANEOUS
  Filled 2012-11-09 (×3): qty 0.4

## 2012-11-09 NOTE — H&P (Signed)
[facility] History and Physical  [name]  WUJ:[id number]  DOB: 12/21/1920   DOA: 11/09/2012   PCP:   [name], MD   Chief Complaint:  Recurrent falls and weakness since today  HPI: [name] is an 79 y.o. female.   Elderly Caucasian lady with a history of dementia has reportedly become increasingly confused today and fell twice, once out of bed. She was brought to the emergency room to be evaluated and was found to have no focal lateralizing neurological signs, but to have evidence of urinary tract infection. Hospitalist service was called to assist.  On them longer available so assist with history.  Patient is alert and conversational but not oriented and is only able to state that she is embarrassed because she felt to bed.  Rewiew of Systems:  Unable to obtain because of patient's mental status    Past Medical History  Diagnosis Date  . Hypothyroidism   . Peripheral neuropathy   . Shortness of breath   . Hiatal hernia   . Schatzki's ring   . Hypertension     Past Surgical History  Procedure Laterality Date  . Esophagogastroduodenoscopy  10/24/08    normal, status post [facility] dilator, small hiatal hernia deformity of the antrum/pylorus  . Colonoscopy  10/24/08    anal papilla otherwise normal, pan colonic diverticulum and colonic mucosa  . Appendectomy  1941  . Abdominal hysterectomy  1977  . Cholecystectomy  1967  . Spine surgery  1986  . Knee surgery  09/2008  . Hemorrhoid surgery  1999  . Colonoscopy  remote    Dr. Jerolyn Shin Smith-->polpys per patient  . Ercp  1995    Dr. Rourk--> for dilated biliary tree. no stone or neoplasm or stricture found  . Esophagogastroduodenoscopy  1995    Dr. Rourk--> empiric dilation of esophagus with resolution of dysphagia  . Esophagogastroduodenoscopy   09/21/2011    RMR: Noncritical Schatzki's ring s/p dilated/Abnormal esophageal mucosa/Small  hiatal hermia    Medications:  HOME MEDS: Prior to Admission  medications   Medication Sig Start Date End Date Taking? Authorizing Provider  acetaminophen (TYLENOL) 500 MG tablet Take 1,000 mg by mouth at bedtime.   Yes Historical Provider, MD  gabapentin (NEURONTIN) 300 MG capsule Take 300 mg by mouth 3 (three) times daily.   Yes Historical Provider, MD  levothyroxine (SYNTHROID, LEVOTHROID) 50 MCG tablet Take 50 mcg by mouth daily.   Yes Historical Provider, MD  pantoprazole (PROTONIX) 40 MG tablet Take 1 tablet (40 mg total) by mouth daily. 09/27/12  Yes Nira Retort, NP     Allergies:  Allergies  Allergen Reactions  . Lactulose     REACTION: unknown reaction  . Penicillins     REACTION: unknown reaction  . Prilosec (Omeprazole) Itching and Rash    Social History:   reports that she has never smoked. She does not have any smokeless tobacco history on file. She reports that she does not drink alcohol or use illicit drugs.  Family History: Family History  Problem Relation Age of Onset  . Heart attack Father 33    deceased     Physical Exam: Filed Vitals:   11/09/12 1739 11/09/12 1800 11/09/12 1900 11/09/12 2100  BP:  117/55 120/58 124/62  Pulse:  63 62 62  Temp: 98 F (36.7 C)   97.4 F (36.3 C)  TempSrc:    Oral  Resp:  22 19 17   Height:  Weight:      SpO2:  96%  98%   Blood pressure 124/62, pulse 62, temperature 97.4 F (36.3 C), temperature source Oral, resp. rate 17, height 5\' 5"  (1.651 m), weight 84.823 kg (187 lb), SpO2 98.00%.  GEN:  Pleasant pleasantly demented elderly Caucasian lady in lying bed in no acute distress;  PSYCH:  alert and oriented x1; neither anxious nor depressed;  HEENT: Mucous membranes pink dry and anicteric; PERRLA; EOM intact; no cervical lymphadenopathy nor thyromegaly or carotid bruit; no JVD; Breasts:: Not examined CHEST WALL: No tenderness CHEST: Normal respiration, clear to auscultation bilaterally HEART: Regular rate and rhythm; no murmurs rubs or gallops BACK: Marked kyphosis no  scoliosis; no CVA tenderness ABDOMEN:  soft non-tender; no masses, no organomegaly, normal abdominal bowel sounds; no pannus; no intertriginous candida. Rectal Exam: Not done EXTREMITIES:  age-appropriate arthropathy of the hands and knees; no edema; no ulcerations. Genitalia: not examined PULSES: 2+ and symmetric SKIN: Normal hydration no rash or ulceration CNS: Cranial nerves 2-12 grossly intact no focal lateralizing neurologic deficit   Labs on Admission:  Basic Metabolic Panel:  Recent Labs Lab 11/09/12 1640  NA 139  K 3.7  CL 100  CO2 26  GLUCOSE 153*  BUN 17  CREATININE 0.94  CALCIUM 9.4   Liver Function Tests:  Recent Labs Lab 11/09/12 1640  AST 23  ALT 11  ALKPHOS 48  BILITOT 0.7  PROT 6.8  ALBUMIN 3.5    Recent Labs Lab 11/09/12 1640  LIPASE 12   No results found for this basename: AMMONIA,  in the last 168 hours CBC:  Recent Labs Lab 11/09/12 1640  WBC 15.6*  NEUTROABS 13.3*  HGB 13.6  HCT 41.2  MCV 90.0  PLT 210   Cardiac Enzymes:  Recent Labs Lab 11/09/12 1640  TROPONINI <0.30   BNP: No components found with this basename: POCBNP,  D-dimer: No components found with this basename: D-DIMER,  CBG: No results found for this basename: GLUCAP,  in the last 168 hours  Radiological Exams on Admission: Dg Chest 2 View  11/09/2012   *RADIOLOGY REPORT*  Clinical Data: Fall, weakness, back pain, confused  CHEST - 2 VIEW  Comparison: 11/03/2010  Findings: Low lung volumes.  No pleural effusion or pneumothorax.  Stable cardiomegaly.  Degenerative changes of the visualized thoracolumbar spine.  IMPRESSION: No evidence of acute cardiopulmonary disease.   Original Report Authenticated By: Charline Bills, M.D.   Dg Hip Bilateral W/pelvis  11/09/2012   *RADIOLOGY REPORT*  Clinical Data: Fall, right greater than left hip pain  BILATERAL HIP WITH PELVIS - 4+ VIEW  Comparison: None.  Findings: No fracture or dislocation is seen.  The bilateral hip  joint spaces are symmetric.  Visualized bony pelvis appears intact.  Mild degenerative changes of the lower lumbar spine.  IMPRESSION: No fracture or dislocation is seen.   Original Report Authenticated By: Charline Bills, M.D.   Ct Head Wo Contrast  11/09/2012   *RADIOLOGY REPORT*  Clinical Data: Status post fall with head injury.  Weakness, fatigue and confusion.  CT HEAD WITHOUT CONTRAST  Technique:  Contiguous axial images were obtained from the base of the skull through the vertex without contrast.  Comparison: Head CT 11/03/2010.  Findings: Multiple images were repeated due to motion.  However, study remains mildly motion degraded.  There is generalized atrophy with diffuse prominence of the ventricles and subarachnoid spaces. Mild periventricular white matter disease appears stable.  There is no evidence of acute intracranial hemorrhage,  mass lesion, brain edema or extra-axial fluid collection.  There is no evidence of acute infarction.  The calvarium is demineralized without evidence of acute fracture. Chronic partial opacification of the posterior right ethmoid air cells is stable.  The visualized paranasal sinuses, mastoid air cells and middle ears are otherwise clear.  IMPRESSION:  1.  No acute intracranial or calvarial findings identified. 2.  Atrophy and chronic small vessel ischemic changes.   Original Report Authenticated By: Carey Bullocks, M.D.     Assessment/Plan   Active Problems:    UTI (lower urinary tract infection)  This is likely the cause of her upper arch problems; will admit for hydration and antibiotic therapy; she is allergic to penicillin so we'll try Cipro although this sometimes causes problems in the elderly. We'll try to minimize her hospital stay as this may also contribute to further delirium. When necessary Haldol for agitation    Fall  Fall precautions    Delirium superimposed on dementia  Management as noted above   HYPOTHYROIDISM  Continue Synthroid and  check serum TSH   Other plans as per orders.  Primary care physician will assume care in the morning Code Status: Full code Family Communication: , Not available at this time Disposition Plan: Likely discharged home in a few days    Daphnee Preiss Nocturnist [facility] Pager [phone]  11/09/2012, 9:28 PM

## 2012-11-09 NOTE — ED Notes (Signed)
Family in w/ pt. No needs voiced, advised still waiting on testing. Pt states feels ok, rolled out of bed last night.

## 2012-11-09 NOTE — ED Provider Notes (Signed)
History  This chart was scribed for Shelda Jakes, MD, by Candelaria Stagers, ED Scribe. This patient was seen in room APA18/APA18 and the patient's care was started at 4:40 PM   CSN: [id number]  Arrival date & time 11/09/12  1552   First MD Initiated Contact with Patient 11/09/12 1612      Chief Complaint  Patient presents with  . Weakness   LEVEL 5 CAVEAT  The history is provided by a relative. No language interpreter was used.   HPI Comments: [name] is a 79 y.o. female who presents to the Emergency Department after her grandson reports she became confused and experienced slurred speech after waking from a nap about 20 minutes pta.  He reports another family member found her lying in the floor this morning, but are unsure of when or how she fell.  Family reports she is more confused than normal and is experiencing fatigue and loss of appetite.       Past Medical History  Diagnosis Date  . Hypothyroidism   . Peripheral neuropathy   . Shortness of breath   . Hiatal hernia   . Schatzki's ring   . Hypertension     Past Surgical History  Procedure Laterality Date  . Esophagogastroduodenoscopy  10/24/08    normal, status post [facility] dilator, small hiatal hernia deformity of the antrum/pylorus  . Colonoscopy  10/24/08    anal papilla otherwise normal, pan colonic diverticulum and colonic mucosa  . Appendectomy  1941  . Abdominal hysterectomy  1977  . Cholecystectomy  1967  . Spine surgery  1986  . Knee surgery  09/2008  . Hemorrhoid surgery  1999  . Colonoscopy  remote    Dr. Jerolyn Shin Smith-->polpys per patient  . Ercp  1995    Dr. Rourk--> for dilated biliary tree. no stone or neoplasm or stricture found  . Esophagogastroduodenoscopy  1995    Dr. Rourk--> empiric dilation of esophagus with resolution of dysphagia  . Esophagogastroduodenoscopy   09/21/2011    RMR: Noncritical Schatzki's ring s/p dilated/Abnormal esophageal mucosa/Small  hiatal hermia    Family  History  Problem Relation Age of Onset  . Heart attack Father 16    deceased    History  Substance Use Topics  . Smoking status: Never Smoker   . Smokeless tobacco: Not on file  . Alcohol Use: No    OB History   Grav Para Term Preterm Abortions TAB SAB Ect Mult Living                  Review of Systems  Unable to perform ROS: Mental status change    Allergies  Lactulose; Penicillins; and Prilosec  Home Medications   Current Outpatient Rx  Name  Route  Sig  Dispense  Refill  . acetaminophen (TYLENOL) 500 MG tablet   Oral   Take 1,000 mg by mouth at bedtime.         . gabapentin (NEURONTIN) 300 MG capsule   Oral   Take 300 mg by mouth 3 (three) times daily.         Marland Kitchen levothyroxine (SYNTHROID, LEVOTHROID) 50 MCG tablet   Oral   Take 50 mcg by mouth daily.         . pantoprazole (PROTONIX) 40 MG tablet   Oral   Take 1 tablet (40 mg total) by mouth daily.   30 tablet   3     BP 147/58  Pulse 70  Temp(Src) 99.2  F (37.3 C) (Oral)  Resp 20  Ht 5\' 5"  (1.651 m)  Wt 187 lb (84.823 kg)  BMI 31.12 kg/m2  SpO2 97%  Physical Exam  Nursing note and vitals reviewed. Constitutional: She appears well-developed and well-nourished.  HENT:  Head: Normocephalic.  Cardiovascular: Regular rhythm.   Murmur (systolic murmur) heard. Pulmonary/Chest: Effort normal and breath sounds normal. She has no wheezes. She has no rales.  Abdominal: Soft. Bowel sounds are normal. There is no tenderness.  Musculoskeletal: She exhibits no edema.  Moving all extremities  Neurological: She is alert. No cranial nerve deficit.  Skin: Skin is warm and dry.    ED Course  Procedures  DIAGNOSTIC STUDIES: Oxygen Saturation is 97% on room air, normal by my interpretation.    COORDINATION OF CARE:  4:42 PM Discussed course of care with family.  Family understand and agree.    Labs Reviewed  URINALYSIS, ROUTINE W REFLEX MICROSCOPIC - Abnormal; Notable for the following:     APPearance CLOUDY (*)    Hgb urine dipstick SMALL (*)    Protein, ur 30 (*)    Nitrite POSITIVE (*)    Leukocytes, UA MODERATE (*)    All other components within normal limits  CBC WITH DIFFERENTIAL - Abnormal; Notable for the following:    WBC 15.6 (*)    Neutrophils Relative % 85 (*)    Neutro Abs 13.3 (*)    Lymphocytes Relative 7 (*)    Monocytes Absolute 1.3 (*)    All other components within normal limits  COMPREHENSIVE METABOLIC PANEL - Abnormal; Notable for the following:    Glucose, Bld 153 (*)    GFR calc non Af Amer 52 (*)    GFR calc Af Amer 60 (*)    All other components within normal limits  URINE MICROSCOPIC-ADD ON - Abnormal; Notable for the following:    Bacteria, UA MANY (*)    All other components within normal limits  URINE CULTURE  LIPASE, BLOOD  TROPONIN I   Dg Chest 2 View  11/09/2012   *RADIOLOGY REPORT*  Clinical Data: Fall, weakness, back pain, confused  CHEST - 2 VIEW  Comparison: 11/03/2010  Findings: Low lung volumes.  No pleural effusion or pneumothorax.  Stable cardiomegaly.  Degenerative changes of the visualized thoracolumbar spine.  IMPRESSION: No evidence of acute cardiopulmonary disease.   Original Report Authenticated By: Charline Bills, M.D.   Dg Hip Bilateral W/pelvis  11/09/2012   *RADIOLOGY REPORT*  Clinical Data: Fall, right greater than left hip pain  BILATERAL HIP WITH PELVIS - 4+ VIEW  Comparison: None.  Findings: No fracture or dislocation is seen.  The bilateral hip joint spaces are symmetric.  Visualized bony pelvis appears intact.  Mild degenerative changes of the lower lumbar spine.  IMPRESSION: No fracture or dislocation is seen.   Original Report Authenticated By: Charline Bills, M.D.   Ct Head Wo Contrast  11/09/2012   *RADIOLOGY REPORT*  Clinical Data: Status post fall with head injury.  Weakness, fatigue and confusion.  CT HEAD WITHOUT CONTRAST  Technique:  Contiguous axial images were obtained from the base of the skull  through the vertex without contrast.  Comparison: Head CT 11/03/2010.  Findings: Multiple images were repeated due to motion.  However, study remains mildly motion degraded.  There is generalized atrophy with diffuse prominence of the ventricles and subarachnoid spaces. Mild periventricular white matter disease appears stable.  There is no evidence of acute intracranial hemorrhage, mass lesion, brain edema or extra-axial  fluid collection.  There is no evidence of acute infarction.  The calvarium is demineralized without evidence of acute fracture. Chronic partial opacification of the posterior right ethmoid air cells is stable.  The visualized paranasal sinuses, mastoid air cells and middle ears are otherwise clear.  IMPRESSION:  1.  No acute intracranial or calvarial findings identified. 2.  Atrophy and chronic small vessel ischemic changes.   Original Report Authenticated By: Carey Bullocks, M.D.     Date: 11/09/2012  Rate: 70  Rhythm: normal sinus rhythm  QRS Axis: left  Intervals: normal  ST/T Wave abnormalities: nonspecific T wave changes  Conduction Disutrbances:first-degree A-V block   Narrative Interpretation:   Old EKG Reviewed: none available  Results for orders placed during the hospital encounter of 11/09/12  URINALYSIS, ROUTINE W REFLEX MICROSCOPIC      Result Value Range   Color, Urine YELLOW  YELLOW   APPearance CLOUDY (*) CLEAR   Specific Gravity, Urine 1.020  1.005 - 1.030   pH 6.0  5.0 - 8.0   Glucose, UA NEGATIVE  NEGATIVE mg/dL   Hgb urine dipstick SMALL (*) NEGATIVE   Bilirubin Urine NEGATIVE  NEGATIVE   Ketones, ur NEGATIVE  NEGATIVE mg/dL   Protein, ur 30 (*) NEGATIVE mg/dL   Urobilinogen, UA 0.2  0.0 - 1.0 mg/dL   Nitrite POSITIVE (*) NEGATIVE   Leukocytes, UA MODERATE (*) NEGATIVE  CBC WITH DIFFERENTIAL      Result Value Range   WBC 15.6 (*) 4.0 - 10.5 K/uL   RBC 4.58  3.87 - 5.11 MIL/uL   Hemoglobin 13.6  12.0 - 15.0 g/dL   HCT 69.6  29.5 - 28.4 %   MCV  90.0  78.0 - 100.0 fL   MCH 29.7  26.0 - 34.0 pg   MCHC 33.0  30.0 - 36.0 g/dL   RDW 13.2  44.0 - 10.2 %   Platelets 210  150 - 400 K/uL   Neutrophils Relative % 85 (*) 43 - 77 %   Neutro Abs 13.3 (*) 1.7 - 7.7 K/uL   Lymphocytes Relative 7 (*) 12 - 46 %   Lymphs Abs 1.0  0.7 - 4.0 K/uL   Monocytes Relative 8  3 - 12 %   Monocytes Absolute 1.3 (*) 0.1 - 1.0 K/uL   Eosinophils Relative 0  0 - 5 %   Eosinophils Absolute 0.0  0.0 - 0.7 K/uL   Basophils Relative 0  0 - 1 %   Basophils Absolute 0.0  0.0 - 0.1 K/uL  COMPREHENSIVE METABOLIC PANEL      Result Value Range   Sodium 139  135 - 145 mEq/L   Potassium 3.7  3.5 - 5.1 mEq/L   Chloride 100  96 - 112 mEq/L   CO2 26  19 - 32 mEq/L   Glucose, Bld 153 (*) 70 - 99 mg/dL   BUN 17  6 - 23 mg/dL   Creatinine, Ser 7.25  0.50 - 1.10 mg/dL   Calcium 9.4  8.4 - 36.6 mg/dL   Total Protein 6.8  6.0 - 8.3 g/dL   Albumin 3.5  3.5 - 5.2 g/dL   AST 23  0 - 37 U/L   ALT 11  0 - 35 U/L   Alkaline Phosphatase 48  39 - 117 U/L   Total Bilirubin 0.7  0.3 - 1.2 mg/dL   GFR calc non Af Amer 52 (*) >90 mL/min   GFR calc Af Amer 60 (*) >90 mL/min  LIPASE,  BLOOD      Result Value Range   Lipase 12  11 - 59 U/L  TROPONIN I      Result Value Range   Troponin I <0.30  <0.30 ng/mL  URINE MICROSCOPIC-ADD ON      Result Value Range   WBC, UA TOO NUMEROUS TO COUNT  <3 WBC/hpf   RBC / HPF 0-2  <3 RBC/hpf   Bacteria, UA MANY (*) RARE      1. UTI (lower urinary tract infection)   2. Fall, initial encounter       MDM  Patient with at least 2 falls at home today. Patient was also found to be very sleepy confused and some question of having trouble talking. Patient has improved a little bit here in the emergency department. Workup reveals a fairly significant urinary tract infection patient without any evidence of hip injury from the fall head CT is also negative. Does have a leukocytosis urine culture is pending renal function is normal. No  significant anemia. Troponin was negative. EKG without acute changes. Chest x-ray without pneumonia pneumothorax or pulmonary edema. Recommend patient be admitted until improved started IV Cipro because of 10 allergy. Urine culture is pending.   I personally performed the services described in this documentation, which was scribed in my presence. The recorded information has been reviewed and is accurate.        Shelda Jakes, MD 11/09/12 [phone]

## 2012-11-09 NOTE — ED Notes (Signed)
Grandson says pt awakened 20 min pta, confused and trouble talking, .  Fell last night. When stood up she fell forward onto her dresser.does not remember how she got up.No pain

## 2012-11-09 NOTE — ED Notes (Signed)
Attempted to call report. Was advised nurse receiving pt will call this nurse back for report. 

## 2012-11-09 NOTE — Progress Notes (Signed)
ANTIBIOTIC CONSULT NOTE - INITIAL  Pharmacy Consult for ciprofloxacin Indication: UTI  Allergies  Allergen Reactions  . Lactulose     REACTION: unknown reaction  . Penicillins     REACTION: unknown reaction  . Prilosec (Omeprazole) Itching and Rash    Patient Measurements: Height: 5\' 5"  (165.1 cm) Weight: 187 lb (84.823 kg) IBW/kg (Calculated) : 57   Vital Signs: Temp: 97.4 F (36.3 C) (05/30 2100) Temp src: Oral (05/30 2100) BP: 124/62 mmHg (05/30 2100) Pulse Rate: 62 (05/30 2100) Intake/Output from previous day:   Intake/Output from this shift:    Labs:  Recent Labs  11/09/12 1640  WBC 15.6*  HGB 13.6  PLT 210  CREATININE 0.94   Estimated Creatinine Clearance: 42.8 ml/min (by C-G formula based on Cr of 0.94). No results found for this basename: VANCOTROUGH, VANCOPEAK, VANCORANDOM, GENTTROUGH, GENTPEAK, GENTRANDOM, TOBRATROUGH, TOBRAPEAK, TOBRARND, AMIKACINPEAK, AMIKACINTROU, AMIKACIN,  in the last 72 hours   Microbiology: No results found for this or any previous visit (from the past 720 hour(s)).  Medical History: Past Medical History  Diagnosis Date  . Hypothyroidism   . Peripheral neuropathy   . Shortness of breath   . Hiatal hernia   . Schatzki's ring   . Hypertension     Medications:  Scheduled:  . acetaminophen  1,000 mg Oral QHS  . enoxaparin (LOVENOX) injection  40 mg Subcutaneous Q24H  . gabapentin  300 mg Oral TID  . [START ON 11/10/2012] levothyroxine  50 mcg Oral Daily  . [START ON 11/10/2012] pantoprazole  40 mg Oral Daily   Assessment: 79 yo female who fell twice today and presents with slurred speech and confusion.  Patient has significant UTI. Urine culture is pending. CrCl 45mL/min.   Plan:  Ciprofloxacin 400 mg IV Q 12 hours.  First dose given in ED at 2004.   Josejulian Tarango Scarlett 11/09/2012,11:12 PM

## 2012-11-10 LAB — CBC
HCT: 35.6 % — ABNORMAL LOW (ref 36.0–46.0)
Hemoglobin: 11.7 g/dL — ABNORMAL LOW (ref 12.0–15.0)
MCH: 29.8 pg (ref 26.0–34.0)
MCV: 90.6 fL (ref 78.0–100.0)
RBC: 3.93 MIL/uL (ref 3.87–5.11)

## 2012-11-10 LAB — COMPREHENSIVE METABOLIC PANEL
ALT: 9 U/L (ref 0–35)
BUN: 24 mg/dL — ABNORMAL HIGH (ref 6–23)
CO2: 28 mEq/L (ref 19–32)
Calcium: 8.6 mg/dL (ref 8.4–10.5)
Creatinine, Ser: 1.15 mg/dL — ABNORMAL HIGH (ref 0.50–1.10)
GFR calc Af Amer: 47 mL/min — ABNORMAL LOW (ref >90)
GFR calc non Af Amer: 41 mL/min — ABNORMAL LOW (ref >90)
Glucose, Bld: 116 mg/dL — ABNORMAL HIGH (ref 70–99)

## 2012-11-10 LAB — GLUCOSE, CAPILLARY

## 2012-11-10 NOTE — Progress Notes (Signed)
[name], [name]                   ACCOUNT NO.:  [id number]  MEDICAL RECORD NO.:  [id number]  LOCATION:  A325                          FACILITY:  [facility]  PHYSICIAN:  Reisha Wos G. Renard Matter, MD   DATE OF BIRTH:  May 14, 1921  DATE OF PROCEDURE: DATE OF DISCHARGE:                                PROGRESS NOTE   This patient presented to the emergency department after having become confused at home following a nap.  She was found lying on the floor this morning.  More confused than normal.  She appears to be more alert at this point.  She did have a head CT, which showed no acute intracranial findings, atrophy, or chronic small vessel changes were present.  X-rays of the hip and pelvis shows no fracture.  Chest x-ray, no acute cardiopulmonary disease.  The patient does have urinary tract infection with the increased number of WBCs.  She was started on IV Cipro. Cultures are pending.  OBJECTIVE:  VITAL SIGNS:  Blood pressure 109/60, respirations 18, pulse 50, temp 97.5. HEENT:  Negative. NECK:  Supple.  No JVD or thyroid abnormalities. HEART:  Regular rhythm.  Systolic murmur heard over aortic area. LUNGS:  Clear to P and A. ABDOMEN:  No palpable organs or masses. NEUROLOGIC:  The patient is alert.  She had no cranial nerve abnormalities.  She is moving all extremities.  Reflexes are normal.  ASSESSMENT:  The patient does have urinary tract infection with cultures pending.  She did have a fall with no serious injuries.  Some slurring of speech.  Previously CT had no evidence of CVA.  PLAN:  To continue current IV antibiotics.  Continue current regimens.     Cortlin Marano G. Renard Matter, MD     AGM/MEDQ  D:  11/10/2012  T:  11/10/2012  Job:  [id number]

## 2012-11-11 NOTE — Progress Notes (Signed)
[name], [name]                   ACCOUNT NO.:  [id number]  MEDICAL RECORD NO.:  [id number]  LOCATION:  A325                          FACILITY:  [facility]  PHYSICIAN:  Byford Schools G. Renard Matter, MD   DATE OF BIRTH:  05/02/1921  DATE OF PROCEDURE: DATE OF DISCHARGE:                                PROGRESS NOTE   SUBJECTIVE:  This patient appears to be more alert today and still has urinary symptoms, frequency, and dysuria.  No new neurological symptoms.  OBJECTIVE:  VITAL SIGNS:  Blood pressure 145/70, respirations 24, pulse 50, temp 97.2. HEENT:  Negative. NECK:  Supple.  No JVD or thyroid abnormalities. HEART:  Regular rhythm.  No murmurs. LUNGS:  Clear to P and A. ABDOMEN:  No palpable organs or masses. NEUROLOGIC:  Patient is alert and oriented.  She has no cranial nerve abnormalities.  No motor or sensory abnormalities in extremities.  ASSESSMENT:  The patient does have urinary tract infection.  Culture is pending.  She did have a fall with no serious injuries.  Previous cerebrovascular accident noted on CT.  PLAN:  To begin ambulation today.  Continue current IV antibiotics.     Alane Hanssen G. Renard Matter, MD     AGM/MEDQ  D:  11/11/2012  T:  11/11/2012  Job:  [id number]

## 2012-11-12 LAB — BASIC METABOLIC PANEL
Calcium: 8.3 mg/dL — ABNORMAL LOW (ref 8.4–10.5)
GFR calc non Af Amer: 51 mL/min — ABNORMAL LOW (ref >90)
Glucose, Bld: 104 mg/dL — ABNORMAL HIGH (ref 70–99)
Sodium: 140 mEq/L (ref 135–145)

## 2012-11-12 NOTE — Discharge Summary (Signed)
NAMEONEIKA, SIMONIAN                   ACCOUNT NO.:  [id number]  MEDICAL RECORD NO.:  [id number]  LOCATION:  A325                          FACILITY:  [facility]  PHYSICIAN:  Libi Corso G. Renard Matter, MD   DATE OF BIRTH:  [birth year]  DATE OF ADMISSION:  11/09/2012 DATE OF DISCHARGE:  06/01/2014LH                              DISCHARGE SUMMARY   DIAGNOSES:  Urinary tract infection, organism undetermined; dementia mild; history of fall, no injuries; hypothyroidism; hypertension.  HISTORY OF PRESENT ILLNESS:  This 79 year old elderly Caucasian lady has history of dementia, became more confused prior to coming to the hospital, fell twice once out of bed.  No definite injuries noted, was found to have evidence of urinary tract infection, admission physician. She stated she had some burning and dysuria over the period of several days.  She was subsequently admitted.  PHYSICAL EXAMINATION:  VITAL SIGNS:  Blood pressure 124/62, pulse 62, temperature 97.4, respiration 17. GENERAL:  The patient was alert and oriented. HEENT: Dry membranes.  PERRLA.  Oropharynx benign. NECK:  Supple.  No JVD or thyroid abnormalities. HEART:  Regular rhythm.  No murmurs. ABDOMEN:  No palpable organs or masses.  No organomegaly. EXTREMITIES:  No evidence of trauma. SKIN:  Normal hydration.  No ulcerations.  No rashes.  No lacerations. NEUROLOGIC:  Cranial nerves II through XII intact.  No focal or lateralizing abnormalities.  LABORATORY DATA:  Admission CBC; WBC 15,600, with hemoglobin 3.6, hematocrit 41.2.  Lipase 12.  Chemistries; sodium 139, potassium 3.7, chloride 100, CO2 of 26, glucose 153, BUN 17, creatinine 0.94, calcium 9.4.  Alkaline phosphatase 23, ALT 11.  Urine culture is pending. Troponin less than 0.3.  Lipase 12.  Urine showed positive nitrites, moderate leukocytes.  WBCs too numerous to count.  TSH 7.101.  RADIOLOGY DATA:  Chest x-ray, no evidence of acute cardiopulmonary disease.  Bilateral hip and pelvis,  no fracture dislocation seen.  CT of the head without contrast, no acute intracranial findings identified, atrophy and chronic small vessel ischemic changes.  HOSPITAL COURSE:  At the time of her admission was placed on intravenous fluids, 0.9% sodium chloride infusion.  Her vital signs were monitored. She was started on IV Cipro 400 mg every 12 hours.  She was continued on gabapentin 300 mg t.i.d., Synthroid 50 mcg daily, Protonix 40 mg daily, and acetaminophen 1000 mg at bedtime.  Throughout her hospitalization she became more alert and oriented.  She was able to be ambulated.  She obviously had urinary tract infection and had some frequency of urination.  The urine was cultured but at the time of this dictation we do not have a final report from the urine culture.  But she was not up and about some yesterday with the help of nursing staff, and felt she could be discharged today and followed up as an outpatient. She is more alert now.  Did have slight elevation of creatinine 1.15, thought to be secondary to dehydration.     Loney Domingo G. Renard Matter, MD     AGM/MEDQ  D:  11/12/2012  T:  11/12/2012  Job:  [id number]

## 2012-11-12 NOTE — Progress Notes (Signed)
Patient and son were given discharge instructions and prescriptions. Patient was stable and IV was taken out. Patient was wheeled down in a wheelchair by the RN and taken home by son.

## 2012-11-13 LAB — URINE CULTURE: Colony Count: >=100000

## 2013-07-17 ENCOUNTER — Encounter (HOSPITAL_COMMUNITY): Payer: Self-pay | Admitting: Emergency Medicine

## 2013-07-17 ENCOUNTER — Inpatient Hospital Stay (HOSPITAL_COMMUNITY)
Admission: EM | Admit: 2013-07-17 | Discharge: 2013-07-22 | DRG: 872 | Disposition: A | Discharge status: Home Health | Payer: Medicare Other | Attending: Family Medicine | Admitting: Family Medicine

## 2013-07-17 ENCOUNTER — Emergency Department (HOSPITAL_COMMUNITY): Payer: Medicare Other

## 2013-07-17 DIAGNOSIS — R509 Fever, unspecified: Secondary | ICD-10-CM

## 2013-07-17 DIAGNOSIS — R531 Weakness: Secondary | ICD-10-CM

## 2013-07-17 DIAGNOSIS — I10 Essential (primary) hypertension: Secondary | ICD-10-CM | POA: Diagnosis present

## 2013-07-17 DIAGNOSIS — Z8249 Family history of ischemic heart disease and other diseases of the circulatory system: Secondary | ICD-10-CM

## 2013-07-17 DIAGNOSIS — E039 Hypothyroidism, unspecified: Secondary | ICD-10-CM | POA: Diagnosis present

## 2013-07-17 DIAGNOSIS — A419 Sepsis, unspecified organism: Secondary | ICD-10-CM | POA: Diagnosis present

## 2013-07-17 DIAGNOSIS — F0390 Unspecified dementia without behavioral disturbance: Secondary | ICD-10-CM | POA: Diagnosis present

## 2013-07-17 DIAGNOSIS — R001 Bradycardia, unspecified: Secondary | ICD-10-CM

## 2013-07-17 DIAGNOSIS — E860 Dehydration: Secondary | ICD-10-CM | POA: Diagnosis present

## 2013-07-17 DIAGNOSIS — G609 Hereditary and idiopathic neuropathy, unspecified: Secondary | ICD-10-CM | POA: Diagnosis present

## 2013-07-17 DIAGNOSIS — I959 Hypotension, unspecified: Secondary | ICD-10-CM

## 2013-07-17 DIAGNOSIS — N390 Urinary tract infection, site not specified: Secondary | ICD-10-CM | POA: Diagnosis present

## 2013-07-17 DIAGNOSIS — Z9089 Acquired absence of other organs: Secondary | ICD-10-CM

## 2013-07-17 LAB — BLOOD GAS, ARTERIAL
Acid-base deficit: 3.2 mmol/L — ABNORMAL HIGH (ref 0.0–2.0)
Bicarbonate: 21.2 meq/L (ref 20.0–24.0)
Drawn by: 317771
O2 Content: 2 L/min
O2 Saturation: 97.9 %
Patient temperature: 37.2
TCO2: 19.4 mmol/L (ref 0–100)
pCO2 arterial: 37.5 mmHg (ref 35.0–45.0)
pH, Arterial: 7.371 (ref 7.350–7.450)
pO2, Arterial: 108 mmHg — ABNORMAL HIGH (ref 80.0–100.0)

## 2013-07-17 LAB — CBC WITH DIFFERENTIAL/PLATELET
Basophils Absolute: 0 10*3/uL (ref 0.0–0.1)
Basophils Relative: 0 % (ref 0–1)
EOS ABS: 0 10*3/uL (ref 0.0–0.7)
Eosinophils Relative: 1 % (ref 0–5)
HCT: 39.7 % (ref 36.0–46.0)
HEMOGLOBIN: 13 g/dL (ref 12.0–15.0)
Lymphocytes Relative: 8 % — ABNORMAL LOW (ref 12–46)
Lymphs Abs: 0.6 10*3/uL — ABNORMAL LOW (ref 0.7–4.0)
MCH: 30.1 pg (ref 26.0–34.0)
MCHC: 32.7 g/dL (ref 30.0–36.0)
MCV: 91.9 fL (ref 78.0–100.0)
MONOS PCT: 0 % — AB (ref 3–12)
Monocytes Absolute: 0 10*3/uL — ABNORMAL LOW (ref 0.1–1.0)
NEUTROS ABS: 6.8 10*3/uL (ref 1.7–7.7)
NEUTROS PCT: 91 % — AB (ref 43–77)
PLATELETS: 269 10*3/uL (ref 150–400)
RBC: 4.32 MIL/uL (ref 3.87–5.11)
RDW: 13.1 % (ref 11.5–15.5)
WBC: 7.4 10*3/uL (ref 4.0–10.5)

## 2013-07-17 LAB — COMPREHENSIVE METABOLIC PANEL
ALK PHOS: 57 U/L (ref 39–117)
ALT: 9 U/L (ref 0–35)
AST: 18 U/L (ref 0–37)
Albumin: 3.1 g/dL — ABNORMAL LOW (ref 3.5–5.2)
BILIRUBIN TOTAL: 0.6 mg/dL (ref 0.3–1.2)
BUN: 17 mg/dL (ref 6–23)
CHLORIDE: 106 meq/L (ref 96–112)
CO2: 24 mEq/L (ref 19–32)
Calcium: 9.3 mg/dL (ref 8.4–10.5)
Creatinine, Ser: 1.07 mg/dL (ref 0.50–1.10)
GFR calc Af Amer: 51 mL/min — ABNORMAL LOW (ref >90)
GFR calc non Af Amer: 44 mL/min — ABNORMAL LOW (ref >90)
GLUCOSE: 139 mg/dL — AB (ref 70–99)
POTASSIUM: 4 meq/L (ref 3.7–5.3)
SODIUM: 145 meq/L (ref 137–147)
TOTAL PROTEIN: 6.7 g/dL (ref 6.0–8.3)

## 2013-07-17 LAB — URINALYSIS, ROUTINE W REFLEX MICROSCOPIC
BILIRUBIN URINE: NEGATIVE
GLUCOSE, UA: NEGATIVE mg/dL
KETONES UR: NEGATIVE mg/dL
Nitrite: POSITIVE — AB
PROTEIN: NEGATIVE mg/dL
Specific Gravity, Urine: 1.02 (ref 1.005–1.030)
Urobilinogen, UA: 0.2 mg/dL (ref 0.0–1.0)
pH: 5.5 (ref 5.0–8.0)

## 2013-07-17 LAB — URINE MICROSCOPIC-ADD ON

## 2013-07-17 LAB — MRSA PCR SCREENING: MRSA BY PCR: NEGATIVE

## 2013-07-17 LAB — INFLUENZA PANEL BY PCR (TYPE A & B)
H1N1 flu by pcr: NOT DETECTED
Influenza A By PCR: NEGATIVE
Influenza B By PCR: NEGATIVE

## 2013-07-17 MED ORDER — PHENYLEPHRINE HCL 10 MG/ML IJ SOLN
INTRAMUSCULAR | Status: AC
  Filled 2013-07-17: qty 1

## 2013-07-17 MED ORDER — ACETAMINOPHEN 500 MG PO TABS
1000.0000 mg | ORAL_TABLET | Freq: Once | ORAL | Status: AC
  Administered 2013-07-17: 1000 mg via ORAL
  Filled 2013-07-17: qty 2

## 2013-07-17 MED ORDER — LEVOTHYROXINE SODIUM 50 MCG PO TABS
50.0000 ug | ORAL_TABLET | Freq: Every day | ORAL | Status: DC
  Administered 2013-07-18 – 2013-07-22 (×5): 50 ug via ORAL
  Filled 2013-07-17 (×4): qty 1
  Filled 2013-07-17 (×2): qty 2
  Filled 2013-07-17: qty 1
  Filled 2013-07-17 (×2): qty 2
  Filled 2013-07-17: qty 1
  Filled 2013-07-17: qty 2
  Filled 2013-07-17: qty 1

## 2013-07-17 MED ORDER — PHENYLEPHRINE HCL 10 MG/ML IJ SOLN
30.0000 ug/min | INTRAVENOUS | Status: DC
  Administered 2013-07-17: 30 ug/min via INTRAVENOUS
  Filled 2013-07-17: qty 1

## 2013-07-17 MED ORDER — SODIUM CHLORIDE 0.9 % IV BOLUS (SEPSIS)
1000.0000 mL | Freq: Once | INTRAVENOUS | Status: AC
  Administered 2013-07-17: 1000 mL via INTRAVENOUS

## 2013-07-17 MED ORDER — SODIUM CHLORIDE 0.9 % IV SOLN
INTRAVENOUS | Status: DC
  Administered 2013-07-17: 1000 mL via INTRAVENOUS
  Administered 2013-07-18: 800 mL via INTRAVENOUS
  Administered 2013-07-18: 1000 mL via INTRAVENOUS
  Administered 2013-07-19 – 2013-07-21 (×3): via INTRAVENOUS

## 2013-07-17 MED ORDER — LEVOFLOXACIN IN D5W 500 MG/100ML IV SOLN
500.0000 mg | INTRAVENOUS | Status: DC
  Filled 2013-07-17: qty 100

## 2013-07-17 MED ORDER — PANTOPRAZOLE SODIUM 40 MG PO TBEC
40.0000 mg | DELAYED_RELEASE_TABLET | Freq: Every day | ORAL | Status: DC
  Administered 2013-07-17 – 2013-07-22 (×6): 40 mg via ORAL
  Filled 2013-07-17 (×6): qty 1

## 2013-07-17 MED ORDER — SODIUM CHLORIDE 0.9 % IV BOLUS (SEPSIS)
500.0000 mL | Freq: Once | INTRAVENOUS | Status: AC
  Administered 2013-07-17: 500 mL via INTRAVENOUS

## 2013-07-17 MED ORDER — ENOXAPARIN SODIUM 40 MG/0.4ML ~~LOC~~ SOLN
40.0000 mg | SUBCUTANEOUS | Status: DC
  Administered 2013-07-17 – 2013-07-21 (×5): 40 mg via SUBCUTANEOUS
  Filled 2013-07-17 (×5): qty 0.4

## 2013-07-17 MED ORDER — LEVOFLOXACIN IN D5W 500 MG/100ML IV SOLN
500.0000 mg | Freq: Once | INTRAVENOUS | Status: AC
  Administered 2013-07-17: 500 mg via INTRAVENOUS
  Filled 2013-07-17: qty 100

## 2013-07-17 MED ORDER — ACETAMINOPHEN 325 MG PO TABS
650.0000 mg | ORAL_TABLET | Freq: Four times a day (QID) | ORAL | Status: DC | PRN
  Administered 2013-07-17 – 2013-07-21 (×5): 650 mg via ORAL
  Filled 2013-07-17 (×6): qty 2

## 2013-07-17 MED ORDER — GABAPENTIN 300 MG PO CAPS
300.0000 mg | ORAL_CAPSULE | Freq: Three times a day (TID) | ORAL | Status: DC
  Administered 2013-07-17 – 2013-07-22 (×15): 300 mg via ORAL
  Filled 2013-07-17: qty 1
  Filled 2013-07-17: qty 3
  Filled 2013-07-17 (×14): qty 1

## 2013-07-17 MED ORDER — LEVOFLOXACIN IN D5W 500 MG/100ML IV SOLN
500.0000 mg | INTRAVENOUS | Status: DC
  Administered 2013-07-19: 500 mg via INTRAVENOUS
  Filled 2013-07-17 (×2): qty 100

## 2013-07-17 NOTE — ED Notes (Signed)
vo by dr Renard Mattermcinnis to give 250ml NS bolus x 2 if needed to bring bp up. Orders read back and verified.

## 2013-07-17 NOTE — ED Notes (Signed)
Dr. Renard MatterMcinnis paged. Iv wo at this time.

## 2013-07-17 NOTE — Progress Notes (Signed)
CRITICAL VALUE ALERT  Critical value received:  Gram - rods blood culture  Date of notification: 07/17/13  Time of notification:  1905  Critical value read back:yes  Nurse who received alert:  Lovena NeighboursAnna M Halden Phegley RN  MD notified (1st page):  Dr Juanetta GoslingHawkins  Time of first page:  1905   Responding MD:  Dr Juanetta GoslingHawkins  Time MD responded:  [phone]

## 2013-07-17 NOTE — ED Notes (Signed)
Pt slightly restless and grimacing.

## 2013-07-17 NOTE — ED Notes (Signed)
Family reports pt had some congestion yesterday but was able to perform her regular routine.  Reports this am pt woke up shivering and c/o pain all over.  At present pt denies complaints but appears a little SOB.  Pt alert, oriented to self.  Denies pain at this time.

## 2013-07-17 NOTE — ED Notes (Signed)
Called dr Renard Mattermcinnis to inform of bp after 500ml bolus, bp unchanged.was advised to ask EDP. Dr Lynelle Doctorknapp aware

## 2013-07-17 NOTE — Progress Notes (Addendum)
RN call to elink  SBP 80s, MAP 54, Patient admitted with SIRS/sepsis fever 103 and UTI. Total only 2L so far.    has a past medical history of Hypothyroidism; Peripheral neuropathy; Shortness of breath; Hiatal hernia; Schatzki's ring; and Hypertension. and CHF not listed  A Hypotension , occult septic shock  Plan 1 more liter saline bolus and then maintenance normal saline Start neo via PIV  - sbp > 90 and MAP 60 Check abg (looks dyspneic to me)   Dr. Kalman ShanMurali Sandy Haye, M.D., [facility] Pulmonary and Critical Care Medicine Staff Physician [facility] Pager: [phone], If no answer or between  15:00h - 7:00h: call [phone]  07/17/2013 9:10 PM

## 2013-07-17 NOTE — ED Provider Notes (Signed)
CSN: [id number]     Arrival date & time 07/17/13  0725 History   First MD Initiated Contact with Patient 07/17/13 [phone]     Chief Complaint  Patient presents with  . Chills   Level V caveat for age  (Consider location/radiation/quality/duration/timing/severity/associated sxs/prior Treatment) HPI Patient presents to the emergency department via EMS with her son. Chest today she had some mild congestion. He states this morning about 6 AM she was "hollering". When he went to see what was wrong she said she was hurting all over. He try to get her up but she was too weak to get up. Currently the patient states she feels fine.  She denies pain. She denies feeling short of breath. He denies vomiting, diarrhea, or coughing. He states she did have the flu shot this year. She is not around anyone who is ill. However patient does go to church, which was 3 days ago.  PCP Dr Renard Matter  Past Medical History  Diagnosis Date  . Hypothyroidism   . Peripheral neuropathy   . Shortness of breath   . Hiatal hernia   . Schatzki's ring   . Hypertension    Past Surgical History  Procedure Laterality Date  . Esophagogastroduodenoscopy  10/24/08    normal, status post [facility] dilator, small hiatal hernia deformity of the antrum/pylorus  . Colonoscopy  10/24/08    anal papilla otherwise normal, pan colonic diverticulum and colonic mucosa  . Appendectomy  1941  . Abdominal hysterectomy  1977  . Cholecystectomy  1967  . Spine surgery  1986  . Knee surgery  09/2008  . Hemorrhoid surgery  1999  . Colonoscopy  remote    Dr. Jerolyn Shin Smith-->polpys per patient  . Ercp  1995    Dr. Rourk--> for dilated biliary tree. no stone or neoplasm or stricture found  . Esophagogastroduodenoscopy  1995    Dr. Rourk--> empiric dilation of esophagus with resolution of dysphagia  . Esophagogastroduodenoscopy   09/21/2011    RMR: Noncritical Schatzki's ring s/p dilated/Abnormal esophageal mucosa/Small  hiatal hermia  . Shoulder  surgery     Family History  Problem Relation Age of Onset  . Heart attack Father 59    deceased   History  Substance Use Topics  . Smoking status: Never Smoker   . Smokeless tobacco: Not on file  . Alcohol Use: No  lives at home Lives with son Using cane/walker  OB History   Grav Para Term Preterm Abortions TAB SAB Ect Mult Living                 Review of Systems  All other systems reviewed and are negative.    Allergies  Lactulose; Penicillins; and Prilosec  Home Medications   Current Outpatient Rx  Name  Route  Sig  Dispense  Refill  . gabapentin (NEURONTIN) 300 MG capsule   Oral   Take 300 mg by mouth 3 (three) times daily.         Marland Kitchen levothyroxine (SYNTHROID, LEVOTHROID) 50 MCG tablet   Oral   Take 50 mcg by mouth daily.          BP 116/60  Pulse 84  Temp(Src) 103.2 F (39.6 C) (Rectal)  Resp 28  Ht 5\' 2"  (1.575 m)  Wt 173 lb (78.472 kg)  BMI 31.63 kg/m2  SpO2 91%  Vital signs normal except for fever and tachypnea  Physical Exam  Nursing note and vitals reviewed. Constitutional: She is oriented to person, place, and time.  She appears well-developed and well-nourished.  Non-toxic appearance. She does not appear ill. No distress.  HENT:  Head: Normocephalic and atraumatic.  Right Ear: External ear normal.  Left Ear: External ear normal.  Nose: Nose normal. No mucosal edema or rhinorrhea.  Mouth/Throat: Mucous membranes are normal. No dental abscesses or uvula swelling.  Dry tongue Very hard of hearing  Eyes: Conjunctivae and EOM are normal. Pupils are equal, round, and reactive to light.  Neck: Normal range of motion and full passive range of motion without pain. Neck supple.  Cardiovascular: Normal rate, regular rhythm and normal heart sounds.  Exam reveals no gallop and no friction rub.   No murmur heard. Pulmonary/Chest: Breath sounds normal. Tachypnea noted. No respiratory distress. She has no wheezes. She has no rhonchi. She has no  rales. She exhibits no tenderness and no crepitus.  Abdominal: Soft. Normal appearance and bowel sounds are normal. She exhibits no distension. There is no tenderness. There is no rebound and no guarding.  Musculoskeletal: Normal range of motion. She exhibits no edema and no tenderness.  Moves all extremities well.   Neurological: She is alert and oriented to person, place, and time. She has normal strength. No cranial nerve deficit.  Skin: Skin is warm, dry and intact. No rash noted. No erythema. There is pallor.  Psychiatric: She has a normal mood and affect. Her speech is normal and behavior is normal. Her mood appears not anxious.    ED Course  Procedures (including critical care time)  Medications  levofloxacin (LEVAQUIN) IVPB 500 mg (500 mg Intravenous New Bag/Given 07/17/13 0905)  acetaminophen (TYLENOL) tablet 1,000 mg (1,000 mg Oral Given 07/17/13 0754)   Pt son given test results. Pt started on Levaquin for her UTI (allergic to PCN). Waiting for influenza test results.   09:04 Dr Renard MatterMcInnis admit to med-surg bed   Labs Review Results for orders placed during the hospital encounter of 07/17/13  CBC WITH DIFFERENTIAL      Result Value Range   WBC 7.4  4.0 - 10.5 K/uL   RBC 4.32  3.87 - 5.11 MIL/uL   Hemoglobin 13.0  12.0 - 15.0 g/dL   HCT 16.139.7  09.636.0 - 04.546.0 %   MCV 91.9  78.0 - 100.0 fL   MCH 30.1  26.0 - 34.0 pg   MCHC 32.7  30.0 - 36.0 g/dL   RDW 40.913.1  81.111.5 - 91.415.5 %   Platelets 269  150 - 400 K/uL   Neutrophils Relative % 91 (*) 43 - 77 %   Neutro Abs 6.8  1.7 - 7.7 K/uL   Lymphocytes Relative 8 (*) 12 - 46 %   Lymphs Abs 0.6 (*) 0.7 - 4.0 K/uL   Monocytes Relative 0 (*) 3 - 12 %   Monocytes Absolute 0.0 (*) 0.1 - 1.0 K/uL   Eosinophils Relative 1  0 - 5 %   Eosinophils Absolute 0.0  0.0 - 0.7 K/uL   Basophils Relative 0  0 - 1 %   Basophils Absolute 0.0  0.0 - 0.1 K/uL  COMPREHENSIVE METABOLIC PANEL      Result Value Range   Sodium 145  137 - 147 mEq/L   Potassium  4.0  3.7 - 5.3 mEq/L   Chloride 106  96 - 112 mEq/L   CO2 24  19 - 32 mEq/L   Glucose, Bld 139 (*) 70 - 99 mg/dL   BUN 17  6 - 23 mg/dL   Creatinine, Ser 7.821.07  0.50 -  1.10 mg/dL   Calcium 9.3  8.4 - 16.1 mg/dL   Total Protein 6.7  6.0 - 8.3 g/dL   Albumin 3.1 (*) 3.5 - 5.2 g/dL   AST 18  0 - 37 U/L   ALT 9  0 - 35 U/L   Alkaline Phosphatase 57  39 - 117 U/L   Total Bilirubin 0.6  0.3 - 1.2 mg/dL   GFR calc non Af Amer 44 (*) >90 mL/min   GFR calc Af Amer 51 (*) >90 mL/min  URINALYSIS, ROUTINE W REFLEX MICROSCOPIC      Result Value Range   Color, Urine YELLOW  YELLOW   APPearance CLEAR  CLEAR   Specific Gravity, Urine 1.020  1.005 - 1.030   pH 5.5  5.0 - 8.0   Glucose, UA NEGATIVE  NEGATIVE mg/dL   Hgb urine dipstick MODERATE (*) NEGATIVE   Bilirubin Urine NEGATIVE  NEGATIVE   Ketones, ur NEGATIVE  NEGATIVE mg/dL   Protein, ur NEGATIVE  NEGATIVE mg/dL   Urobilinogen, UA 0.2  0.0 - 1.0 mg/dL   Nitrite POSITIVE (*) NEGATIVE   Leukocytes, UA MODERATE (*) NEGATIVE  URINE MICROSCOPIC-ADD ON      Result Value Range   Squamous Epithelial / LPF FEW (*) RARE   WBC, UA TOO NUMEROUS TO COUNT  <3 WBC/hpf   RBC / HPF TOO NUMEROUS TO COUNT  <3 RBC/hpf   Bacteria, UA MANY (*) RARE   Laboratory interpretation all normal except UTI    Imaging Review Dg Chest Portable 1 View  07/17/2013   CLINICAL DATA:  Shortness of breath and fever.  EXAM: PORTABLE CHEST - 1 VIEW  COMPARISON:  11/09/2012 and 11/03/2010  FINDINGS: Stable appearance of the heart and mediastinum. There is stable prominence along the right side of the mediastinum probably related to vascular structures. Overall, there are low lung volumes. The interstitial markings are prominent without clear evidence for edema. There is no evidence for airspace disease. Bony thorax is intact.  IMPRESSION: Low lung volumes without focal disease.   Electronically Signed   By: Richarda Overlie M.D.   On: 07/17/2013 08:06    EKG Interpretation    None       MDM   1. Weakness   2. Fever and chills   3. UTI (urinary tract infection)    Plan admission  Devoria Albe, MD, Franz Dell, MD 07/17/13 [phone]

## 2013-07-17 NOTE — ED Notes (Signed)
2nd 500ml bolus given per vo dr Lynelle DoctorKnapp, read back and verified.

## 2013-07-18 MED ORDER — PHENYLEPHRINE HCL 10 MG/ML IJ SOLN
30.0000 ug/min | INTRAVENOUS | Status: DC
  Administered 2013-07-18: 80 ug/min via INTRAVENOUS
  Administered 2013-07-18: 60 ug/min via INTRAVENOUS
  Administered 2013-07-18: 30 ug/min via INTRAVENOUS
  Filled 2013-07-18 (×3): qty 2

## 2013-07-18 MED ORDER — PHENYLEPHRINE HCL 10 MG/ML IJ SOLN
INTRAMUSCULAR | Status: AC
  Filled 2013-07-18: qty 2

## 2013-07-18 MED ORDER — PHENYLEPHRINE HCL 10 MG/ML IJ SOLN: 30.0000 ug/min | INTRAVENOUS | Status: DC

## 2013-07-18 MED ORDER — SODIUM CHLORIDE 0.9 % IV BOLUS (SEPSIS)
500.0000 mL | Freq: Once | INTRAVENOUS | Status: AC
  Administered 2013-07-18: 500 mL via INTRAVENOUS

## 2013-07-18 NOTE — Progress Notes (Signed)
UR chart review completed.  

## 2013-07-18 NOTE — Care Management Note (Addendum)
    Page 1 of 2   07/22/2013     8:51:32 AM   CARE MANAGEMENT NOTE 07/22/2013  Patient:  [name],[name]   Account Number:  [id number]  Date Initiated:  07/18/2013  Documentation initiated by:  Sharrie RothmanBLACKWELL,Kennedie Pardoe C  Subjective/Objective Assessment:   Pt admitted from home with dehydration and UTI. Pt lives with her son [name] and has a daughter and another son who is very active in the care of the pt. Pt has a cane and walker for home use and pt states that she will return home at     Action/Plan:   discharge. Pt is fairly independent with ADL's. [facility]. Will continue to follow for discharge planning needs.   Anticipated DC Date:  07/22/2013   Anticipated DC Plan:  HOME W HOME HEALTH SERVICES      DC Planning Services  CM consult      [facility]   Choice offered to / List presented to:  C-1 Patient        HH arranged  HH-1 RN  HH[facility]2 PT      [facility]   Status of service:  Completed, signed off Medicare Important Message given?  YES (If response is "NO", the following Medicare IM given date fields will be blank) Date Medicare IM given:  07/19/2013 Date Additional Medicare IM given:  07/22/2013  Discharge Disposition:  HOME W HOME HEALTH SERVICES  Per UR Regulation:    If discussed at Long Length of Stay Meetings, dates discussed:    Comments:  07/22/13 0850 Arlyss Queenammy Skylynn Burkley, RN BSN CM [facility]. Alroy BailiffLinda Lothian [facility] aware and HH services to start within 48 hours of discharge. Pt and pts nurse aware of discharge arrangements.  07/19/13 1430 Arlyss Queenammy Seline Enzor, RN BSN CM Pt potential discharge over the weekend. Pt chose [facility] for RN and PT (pt given list of agencies). Lillie FragminEmma Blaylock of [facility] is aware and will collect the pts information from the chart. HH services to start within 48 hours of discharge. No DME needs at this time. Pt and pts nurse aware of discharge arrangements.  07/18/13 1545 Arlyss Queenammy  Pope Brunty, RN BSN CM

## 2013-07-18 NOTE — H&P (Signed)
NAMGaston Islam:  Dias, Kaiya                   ACCOUNT NO.:  [id number]  MEDICAL RECORD NO.:  [id number]  LOCATION:  [facility]                         FACILITY:  [facility]  PHYSICIAN:  Jaryd Drew G. Renard MatterMcInnis, MD   DATE OF BIRTH:  October 08, 1920  DATE OF ADMISSION:  07/17/2013 DATE OF DISCHARGE:  LH                             HISTORY & PHYSICAL   HISTORY OF PRESENT ILLNESS:  This 80 year old female was admitted through the emergency department with a chief complaint of chills, generalized pain along with dysuria.  The patient was seen in consultation by the ED physician, and apparently was hurting all over. She did have some dysuria and abdominal discomfort.  Studies were done in the ED.  Her chemistries were normal.  UA did show moderate leukocytes, positive nitrites.  X-ray of chest was normal.  The patient was started on IV fluids in the emergency department because of low blood pressure 80/47.  She was bolused with saline and subsequently admitted to step-down unit and ICU.  SOCIAL HISTORY:  The patient does not smoke or use alcohol.  FAMILY HISTORY:  Father had heart attack, deceased.  PAST MEDICAL HISTORY:  The patient has: 1. History of hypothyroidism. 2. Hiatal hernia. 3. Hypertension. 4. Peripheral neuropathy.  PAST SURGICAL HISTORY:  History of cholecystectomy, abdominal hysterectomy, appendectomy, knee surgery, hemorrhoid surgery, esophagogastroduodenoscopy.  ALLERGIES:  LACTOSE, TAMSULOSIN, PRILOSEC.  HOME MEDICATIONS: 1. Gabapentin 300 mg t.i.d. 2. Synthroid 50 mcg daily.  PHYSICAL EXAMINATION:  GENERAL:  Alert patient. VITAL SIGNS:  Blood pressure 101/48, respirations 28, pulse 74, temp 98.4. HEENT:  Eyes, PERRLA TMs, negative.  Oropharynx, benign.  The patient has some dry mucous membranes.  He has decreased hearing. NECK:  Supple.  No JVD or thyroid abnormalities. HEART:  Regular rhythm.  No murmurs. LUNGS:  Clear to P and A. ABDOMEN:  No palpable organs or masses. EXTREMITY:   Free of edema. NEUROLOGIC:  Cranial nerves are intact.  No motor or sensory abnormalities.  ASSESSMENT:  The patient was admitted to the hospital with what was felt to be dehydration, urinary tract infection, hypotension.  PLAN:  To continue current antibiotic regimen, IV Levaquin.  Continue IV fluids.  Continue to monitor vital signs closely.     Shenay Torti G. Renard MatterMcInnis, MD     AGM/MEDQ  D:  07/17/2013  T:  07/18/2013  Job:  [id number]

## 2013-07-18 NOTE — Progress Notes (Signed)
Subjective: The patient is more alert today. She does have some continued general generalized pain. She does have some dysuria. He was admitted with urinary tract infection her blood pressures more normal    Objective: Vital signs in last 24 hours: Temp:  [97.7 F (36.5 C)-103.2 F (39.6 C)] 98.8 F (37.1 C) (02/05 0500) Pulse Rate:  [58-96] 61 (02/05 0515) Resp:  [8-33] 14 (02/05 0515) BP: (66-125)/(30-60) 97/44 mmHg (02/05 0515) SpO2:  [91 %-100 %] 95 % (02/05 0515) Weight:  [76.5 kg (168 lb 10.4 oz)-80.6 kg (177 lb 11.1 oz)] 80.6 kg (177 lb 11.1 oz) (02/05 0500) Weight change:  Last BM Date: 07/18/13  Intake/Output from previous day: 02/04 0701 - 02/05 0700 In: 1018 [I.V.:1018] Out: 108 [Urine:108] Intake/Output this shift: Total I/O In: 1018 [I.V.:1018] Out: 100 [Urine:100]  Physical Exam: H. EENT negative neck supple no JVD or thyroid abnormalities  Heart regular rhythm  Lungs clear to P&A  Abdomen no palpable organs or masses    Recent Labs  07/17/13 0803  WBC 7.4  HGB 13.0  HCT 39.7  PLT 269   BMET  Recent Labs  07/17/13 0803  NA 145  K 4.0  CL 106  CO2 24  GLUCOSE 139*  BUN 17  CREATININE 1.07  CALCIUM 9.3    Studies/Results: Dg Chest Portable 1 View  07/17/2013   CLINICAL DATA:  Shortness of breath and fever.  EXAM: PORTABLE CHEST - 1 VIEW  COMPARISON:  11/09/2012 and 11/03/2010  FINDINGS: Stable appearance of the heart and mediastinum. There is stable prominence along the right side of the mediastinum probably related to vascular structures. Overall, there are low lung volumes. The interstitial markings are prominent without clear evidence for edema. There is no evidence for airspace disease. Bony thorax is intact.  IMPRESSION: Low lung volumes without focal disease.   Electronically Signed   By: Richarda OverlieAdam  Henn M.D.   On: 07/17/2013 08:06    Medications:  . enoxaparin (LOVENOX) injection  40 mg Subcutaneous Q24H  . gabapentin  300 mg Oral TID  .  [START ON 07/19/2013] levofloxacin (LEVAQUIN) IV  500 mg Intravenous Q48H  . levothyroxine  50 mcg Oral Q breakfast  . pantoprazole  40 mg Oral Daily    . sodium chloride 75 mL/hr at 07/18/13 0500  . phenylephrine (NEO-SYNEPHRINE) Adult infusion 80 mcg/min (07/18/13 0549)     Assessment/Plan: The patient was admitted to the hospital with what was felt to be dehydration and urinary tract infection and hypotension Plan to continue current IV fluids IV Levaquin   LOS: 1 day   Dustyn Dansereau G 07/18/2013, 6:37 AM

## 2013-07-19 DIAGNOSIS — I498 Other specified cardiac arrhythmias: Secondary | ICD-10-CM

## 2013-07-19 DIAGNOSIS — N390 Urinary tract infection, site not specified: Secondary | ICD-10-CM

## 2013-07-19 LAB — URINE CULTURE: Colony Count: >=100000

## 2013-07-19 LAB — TSH: TSH: 28.183 u[IU]/mL — ABNORMAL HIGH (ref 0.350–4.500)

## 2013-07-19 MED ORDER — IPRATROPIUM-ALBUTEROL 0.5-2.5 (3) MG/3ML IN SOLN
3.0000 mL | RESPIRATORY_TRACT | Status: DC | PRN
  Administered 2013-07-19 – 2013-07-22 (×5): 3 mL via RESPIRATORY_TRACT
  Filled 2013-07-19 (×5): qty 3

## 2013-07-19 MED ORDER — NYSTATIN 100000 UNIT/GM EX CREA
TOPICAL_CREAM | Freq: Two times a day (BID) | CUTANEOUS | Status: DC
  Administered 2013-07-19 – 2013-07-21 (×6): via TOPICAL
  Filled 2013-07-19: qty 15

## 2013-07-19 MED ORDER — GUAIFENESIN ER 600 MG PO TB12
1200.0000 mg | ORAL_TABLET | Freq: Two times a day (BID) | ORAL | Status: DC
  Administered 2013-07-19 – 2013-07-22 (×7): 1200 mg via ORAL
  Filled 2013-07-19 (×7): qty 2

## 2013-07-19 NOTE — Progress Notes (Signed)
Subjective: The patient is relatively asymptomatic this morning. According to nursing staff she has low blood pressures during the night while asleep systolic pressures in the 40s and 50s. She is being treated for urinary tract infection   Objective: Vital signs in last 24 hours: Temp:  [97.6 F (36.4 C)-100 F (37.8 C)] 98 F (36.7 C) (02/06 0400) Pulse Rate:  [27-104] 50 (02/06 0630) Resp:  [12-30] 14 (02/06 0630) BP: (79-136)/(34-107) 110/64 mmHg (02/06 0630) SpO2:  [80 %-100 %] 98 % (02/06 0630) Weight change:  Last BM Date: 07/18/13  Intake/Output from previous day: 02/05 0701 - 02/06 0700 In: 2641.2 [I.V.:2641.2] Out: 1300 [Urine:1300] Intake/Output this shift:    Physical Exam: H. EENT negative Heart regular rhythm no murmurs  Lungs clear to P&A  Abdomen no palpable organs or masses   Recent Labs  07/17/13 0803  WBC 7.4  HGB 13.0  HCT 39.7  PLT 269   BMET  Recent Labs  07/17/13 0803  NA 145  K 4.0  CL 106  CO2 24  GLUCOSE 139*  BUN 17  CREATININE 1.07  CALCIUM 9.3    Studies/Results: Dg Chest Portable 1 View  07/17/2013   CLINICAL DATA:  Shortness of breath and fever.  EXAM: PORTABLE CHEST - 1 VIEW  COMPARISON:  11/09/2012 and 11/03/2010  FINDINGS: Stable appearance of the heart and mediastinum. There is stable prominence along the right side of the mediastinum probably related to vascular structures. Overall, there are low lung volumes. The interstitial markings are prominent without clear evidence for edema. There is no evidence for airspace disease. Bony thorax is intact.  IMPRESSION: Low lung volumes without focal disease.   Electronically Signed   By: Richarda OverlieAdam  Henn M.D.   On: 07/17/2013 08:06    Medications:  . enoxaparin (LOVENOX) injection  40 mg Subcutaneous Q24H  . gabapentin  300 mg Oral TID  . levofloxacin (LEVAQUIN) IV  500 mg Intravenous Q48H  . levothyroxine  50 mcg Oral Q breakfast  . pantoprazole  40 mg Oral Daily    . sodium  chloride 75 mL/hr at 07/19/13 0700  . phenylephrine (NEO-SYNEPHRINE) Adult infusion 25 mcg/min (07/19/13 0500)     Assessment/Plan: The patient was admitted to the hospital with what was felt to be dehydration and urinary tract infection. She still has episodes of hypotension particularly during the night while sleeping. Plan to continue current IV fluids IV Levaquin for urinary tract infection. We'll obtain cardiology consult today regarding her hypotension   LOS: 2 days   Jadore Mcguffin G 07/19/2013, 7:08 AM

## 2013-07-19 NOTE — Consult Note (Signed)
The patient was seen and examined, and I agree with the assessment and plan as documented above, with modifications as noted below. Pt admitted with E. Coli UTI, with consequent dehydration and hypotension. Had episodic bradycardia to 49 bpm transiently overnight and low 50 bpm range. However, after commencement of treatment with IV antibiotics and IV fluids, both HR and blood pressures have improved. She is feeling much better and wants to go home. TSH is pending. I recommend continuing her current treatment. Will obtain a baseline ECG. No further cardiac testing is indicated.

## 2013-07-19 NOTE — Consult Note (Signed)
CARDIOLOGY CONSULT NOTE   Patient ID: [name] MRN: [id number] DOB/AGE: 1921-05-31 80 y.o.  Admit Date: 07/17/2013 Referring Physician: Renard MatterMcInnis Primary Physician: Alice ReichertMCINNIS,ANGUS G, MD Consulting Cardiologist: Purvis SheffieldKoneswaran, MD Primary Cardiologist New Reason for Consultation: Bradycardia  Clinical Summary Ms. Boer is a 80 y.o.female admitted with UTI, with chills and dysuria. She was also found to be hypotensive and was given IV rehydration. She was found to have transient bradycardia, HR in the 50's during the overnight hours. She has a history of hypothyroidism and hypertension. She is not on any rate reducing medications.    In ER initial BP was 116/60 but dropped to 80/47 at lowest level. Creatinine 1.07, Albumin 3.1.Blood cultures drawn and are pending. CXR demonstrated low lung volumes without focal disease. She is now on IV antibiotics and IV fluids at 75cc/hr.    Review of PMH reveals that she was last seen by cardiology in 2005 in the setting of chest discomfort, with normal Cardiolite with normal LV fx. She has not followed with cardiology since that time.    She denies chest pain, dizziness,palpitations or near syncope. Son, who is a bedside, states she was having painful urination and chills, leading them to bring to ER. She has some mild dementia.     Allergies  Allergen Reactions  . Lactulose     REACTION: unknown reaction  . Penicillins     REACTION: unknown reaction  . Prilosec [Omeprazole] Itching and Rash    Medications Scheduled Medications: . enoxaparin (LOVENOX) injection  40 mg Subcutaneous Q24H  . gabapentin  300 mg Oral TID  . levofloxacin (LEVAQUIN) IV  500 mg Intravenous Q48H  . levothyroxine  50 mcg Oral Q breakfast  . nystatin cream   Topical BID  . pantoprazole  40 mg Oral Daily    Infusions: . sodium chloride 75 mL/hr at 07/19/13 0747  . phenylephrine (NEO-SYNEPHRINE) Adult infusion 10 mcg/min (07/19/13 0825)    PRN  Medications: acetaminophen   Past Medical History  Diagnosis Date  . Hypothyroidism   . Peripheral neuropathy   . Shortness of breath   . Hiatal hernia   . Schatzki's ring   . Hypertension     Past Surgical History  Procedure Laterality Date  . Esophagogastroduodenoscopy  10/24/08    normal, [facility], small hiatal hernia deformity of the antrum/pylorus  . Colonoscopy  10/24/08    anal papilla otherwise normal, pan colonic diverticulum and colonic mucosa  . Appendectomy  1941  . Abdominal hysterectomy  1977  . Cholecystectomy  1967  . Spine surgery  1986  . Knee surgery  09/2008  . Hemorrhoid surgery  1999  . Colonoscopy  remote    Dr. Jerolyn ShinLeroy Smith-->polpys per patient  . Ercp  1995    Dr. Rourk--> for dilated biliary tree. no stone or neoplasm or stricture found  . Esophagogastroduodenoscopy  1995    Dr. Rourk--> empiric dilation of esophagus with resolution of dysphagia  . Esophagogastroduodenoscopy   09/21/2011    RMR: Noncritical Schatzki's ring s/p dilated/Abnormal esophageal mucosa/Small  hiatal hermia  . Shoulder surgery      Family History  Problem Relation Age of Onset  . Heart attack Father [id number]    deceased    Social History Ms. Downs reports that she has never smoked. She does not have any smokeless tobacco history on file. Ms. [name] reports that she does not drink alcohol.  Review of Systems Otherwise reviewed and negative except as outlined.  Physical Examination Blood pressure 120/53, pulse 49, temperature 98 F (36.7 C), temperature source Oral, resp. rate 16, height 5\' 2"  (1.575 m), weight 177 lb 11.1 oz (80.6 kg), SpO2 98.00%.  Intake/Output Summary (Last 24 hours) at 07/19/13 0929 Last data filed at 07/19/13 0700  Gross per 24 hour  Intake 2641.15 ml  Output   1300 ml  Net 1341.15 ml    Telemetry: NSR rates in the 60's-70's.  HEENT: Conjunctiva and lids normal, oropharynx clear with moist mucosa. Neck: Supple, no elevated  JVP or carotid bruits, no thyromegaly. Lungs: Clear to auscultation, nonlabored breathing at rest. Cardiac: Regular rate and rhythm, no S3 or significant systolic murmur, no pericardial rub. Abdomen: Soft, nontender, no hepatomegaly, bowel sounds present, no guarding or rebound. Extremities: No pitting edema, distal pulses 2+. Skin: Warm and dry. Musculoskeletal: No kyphosis. Neuropsychiatric: Alert and oriented x3, affect grossly appropriate.  Prior Cardiac Testing/Procedures 1. NM Stress test: 06/2003 Negative pharmacologic stress Cardiolite study revealing no stress -  induced EKG abnormalities, normal left ventricular size, and normal left  ventricular systolic function. By scintigraphic imaging, there was a minor  abnormality consistent with a right ventricular insertion artifact. No  convincing evidence for myocardial ischemia or infarction.    Lab Results  Basic Metabolic Panel:  Recent Labs Lab 07/17/13 0803  NA 145  K 4.0  CL 106  CO2 24  GLUCOSE 139*  BUN 17  CREATININE 1.07  CALCIUM 9.3    Liver Function Tests:  Recent Labs Lab 07/17/13 0803  AST 18  ALT 9  ALKPHOS 57  BILITOT 0.6  PROT 6.7  ALBUMIN 3.1*    CBC:  Recent Labs Lab 07/17/13 0803  WBC 7.4  NEUTROABS 6.8  HGB 13.0  HCT 39.7  MCV 91.9  PLT 269     Radiology: CXR 07/17/2013 FINDINGS: Stable appearance of the heart and mediastinum. There is stable prominence along the right side of the mediastinum probably related to vascular structures. Overall, there are low lung volumes. The interstitial markings are prominent without clear evidence for edema. There is no evidence for airspace disease. Bony thorax is intact.  IMPRESSION: Low lung volumes without focal disease.   ZOX:WRUEAVW   Impression and Recommendations 1,Bradycardia: There is no significant bradycardia on review of telemetry. She has no pauses. Heart rate has normal response. She is not on any rate reducing  medications, or has had issues with palpitations, heart racing or feelings of near syncope. I will check a TSH in the setting of hyperthyroidism. Son at bedside states that she has not missed any doses of her medications. Doubt any further cardiac testing will be necessary at this time unless she becomes symptomatic. Nuclear medicine stress test completed in 2005 was negative for ischemia. Do not have reports on echocardiogram. She did not followup with cardiology for his hospitalization in 2005.  2. UTI: Currently being treated with IV antibiotics. Since causing considerable amount of diarrhea. We will provide her with symptomatic relief.  3. Hypothyroidism: Check TSH.   Signed: Bettey Mare. Lyman Bishop NP [facility] 07/19/2013, 9:29 AM Co-Sign MD

## 2013-07-20 LAB — CULTURE, BLOOD (ROUTINE X 2)

## 2013-07-20 NOTE — Progress Notes (Signed)
Subjective: The patient had a fairly comfortable night. Her blood pressures have remained in more normal range. She was seen by cardiology and no further testing is recommended. She does have urinary tract infection and is being treated with antibiotics   Objective: Vital signs in last 24 hours: Temp:  [97.7 F (36.5 C)-99.2 F (37.3 C)] 99.2 F (37.3 C) (02/07 0800) Pulse Rate:  [26-91] 91 (02/07 0500) Resp:  [12-29] 19 (02/07 0600) BP: (94-134)/(46-90) 122/65 mmHg (02/07 0600) SpO2:  [68 %-100 %] 100 % (02/07 0500) Weight change:  Last BM Date: 07/18/13  Intake/Output from previous day: 02/06 0701 - 02/07 0700 In: 1965 [P.O.:240; I.V.:1725] Out: 1300 [Urine:1300] Intake/Output this shift: Total I/O In: 240 [P.O.:240] Out: 250 [Urine:250]  Physical Exam: H. EENT negative  Heart regular rhythm no murmurs  Lungs clear to P&A  Abdomen no palpable organs or masses   No results found for this basename: WBC, HGB, HCT, PLT,  in the last 72 hours BMET No results found for this basename: NA, K, CL, CO2, GLUCOSE, BUN, CREATININE, CALCIUM,  in the last 72 hours  Studies/Results: No results found.  Medications:  . enoxaparin (LOVENOX) injection  40 mg Subcutaneous Q24H  . gabapentin  300 mg Oral TID  . guaiFENesin  1,200 mg Oral BID  . levofloxacin (LEVAQUIN) IV  500 mg Intravenous Q48H  . levothyroxine  50 mcg Oral Q breakfast  . nystatin cream   Topical BID  . pantoprazole  40 mg Oral Daily    . sodium chloride 75 mL/hr at 07/20/13 0600  . phenylephrine (NEO-SYNEPHRINE) Adult infusion 10 mcg/min (07/19/13 0825)     Assessment/Plan: The patient was admitted with to the hospital with dehydration and urinary tract infection. She did have episodes of hypotension which responded to fluids and vasopressors. She remains on IV Levaquin for urinary tract infection. Plan to move her out of the ICU today to MedSurg bed Patient does have Escherichia coli infection of the urine. She  apparently is hypothyroid with elevated TSH of 28. 2 start Synthroid   LOS: 3 days   Ranjit Ashurst G 07/20/2013, 10:02 AM

## 2013-07-21 MED ORDER — LEVOFLOXACIN 500 MG PO TABS: 500.0000 mg | ORAL_TABLET | Freq: Every day | ORAL | Status: DC

## 2013-07-21 MED ORDER — LEVOFLOXACIN 250 MG PO TABS
250.0000 mg | ORAL_TABLET | Freq: Every day | ORAL | Status: DC
  Administered 2013-07-21 – 2013-07-22 (×2): 250 mg via ORAL
  Filled 2013-07-21 (×2): qty 1

## 2013-07-21 NOTE — Progress Notes (Signed)
80 yo F on Levaquin for Ecoli urosepsis.  Her renal function has been stable.  Estimated CrCl ~ 30-7735ml/min.  Levaquin is being changed to po today for hopeful discharge tomorrow.  Levaquin renal dose adjustment for her CrCl<3150ml/min is 250mg  po daily.  Dose has been adjusted per manufacturer recommendation.  Recommend she complete a total of 14 days of Levaquin for urosepsis.    Junita PushMichelle Tattiana Fakhouri, PharmD, BCPS 07/21/2013@8 :29 AM

## 2013-07-21 NOTE — Progress Notes (Signed)
Subjective: The patient had a comfortable night. Her blood pressures have remained in normal range. She does have a urinary tract infection caused by Escherichia coli. And remains on IV antibiotics Levaquin.   Objective: Vital signs in last 24 hours: Temp:  [98.1 F (36.7 C)-99.6 F (37.6 C)] 98.1 F (36.7 C) (02/08 0600) Pulse Rate:  [25-74] 57 (02/08 0600) Resp:  [15-19] 18 (02/08 0600) BP: (105-143)/(42-84) 114/67 mmHg (02/08 0600) SpO2:  [83 %-100 %] 99 % (02/08 0600) Weight change:  Last BM Date: 07/18/13  Intake/Output from previous day: 02/07 0701 - 02/08 0700 In: 240 [P.O.:240] Out: 1900 [Urine:1900] Intake/Output this shift:    Physical Exam: The patient is alert and oriented  H. EENT negative  Heart regular rhythm no murmurs  Lungs clear to P&A  Abdomen no palpable organs or masses   No results found for this basename: WBC, HGB, HCT, PLT,  in the last 72 hours BMET No results found for this basename: NA, K, CL, CO2, GLUCOSE, BUN, CREATININE, CALCIUM,  in the last 72 hours  Studies/Results: No results found.  Medications:  . enoxaparin (LOVENOX) injection  40 mg Subcutaneous Q24H  . gabapentin  300 mg Oral TID  . guaiFENesin  1,200 mg Oral BID  . levofloxacin (LEVAQUIN) IV  500 mg Intravenous Q48H  . levothyroxine  50 mcg Oral Q breakfast  . nystatin cream   Topical BID  . pantoprazole  40 mg Oral Daily    . sodium chloride 75 mL/hr at 07/20/13 0600     Assessment/Plan: The patient was admitted to the hospital with dehydration and urinary tract infection. Escherichia coli was cultured from urine. She had episodes of hypotension which has responded to IV fluids and vasopressors. She has hypothyroidism with elevated TSH. She has been started on Synthroid. The patient will be ambulated today with hopes for discharge in a.m.    LOS: 4 days   Chuong Casebeer G 07/21/2013, 7:49 AM

## 2013-07-21 NOTE — Progress Notes (Signed)
[name] has been up in a chair all day She is voiding without problems

## 2013-07-21 NOTE — Progress Notes (Signed)
.  agmd

## 2013-07-22 MED ORDER — LEVOFLOXACIN 250 MG PO TABS: 250.0000 mg | ORAL_TABLET | Freq: Every day | ORAL | Status: DC

## 2013-07-22 NOTE — Progress Notes (Signed)
Discharge instructions, prescription, and care notes given and explained to the patient and her husband.  Both verbalized understanding.  The patient left the floor via w/c with staff and family in stable condition.

## 2013-07-23 NOTE — Discharge Summary (Signed)
[name]:  Hippler, Nakeita                   ACCOUNT NO.:  [id number]  MEDICAL RECORD NO.:  [id number]  LOCATION:  A302                          FACILITY:  [facility]  PHYSICIAN:  Tishanna Dunford G. Renard MatterMcInnis, MD   DATE OF BIRTH:  [birth year]  DATE OF ADMISSION:  07/17/2013 DATE OF DISCHARGE:  02/09/2015LH                              DISCHARGE SUMMARY   DIAGNOSES:  Urinary tract infection secondary to Escherichia coli, sepsis, dehydration with hypotension, bradycardia, hypothyroidism.  CONDITION:  Stable and improved at the time of her discharge.  HISTORY:  This [age] year old female was admitted through the emergency department with the chief complaint of chills, generalized pain and dysuria.  The patient was seen by ED physician, apparently was hurting all over, did have some dysuria and abdominal discomfort.  Study was done in ED.  UA showed moderate leukocytes with positive nitrites.  The patient had a low blood pressure of 80/47.  She was bolused with saline in the emergency department and admitted to Step-Down Unit.  PHYSICAL EXAMINATION:  GENERAL:  On admission, alert patient with blood pressure 101/48, respiration 28, pulse 74, temp 98.4. HEENT:  Eyes, PERRLA.  TM negative.  Oropharynx benign. NECK:  Supple.  No JVD or thyroid abnormalities. HEART:  Regular rhythm.  No murmurs. LUNGS:  Clear to P and A. ABDOMEN:  No palpable organs or masses. EXTREMITIES:  Free of edema. NEUROLOGIC:  Cranial nerves intact.  No motor or sensory abnormalities.  LABORATORY DATA:  Blood cultures; E. coli, TSH 28.183.  CBC on admission; WBC 7.4, hemoglobin 13.0, hematocrit 39.7.  Chemistries; sodium 145, potassium 4.0, chloride 106, CO2 24, glucose 139, BUN 17, creatinine 1.07, albumin 3.1, GFR 44.  RADIOLOGY:  Chest x-ray on admission, low lung volumes without focal disease.  HOSPITAL COURSE:  The patient on admission had to be bolused with saline.  She was placed in intensive care.  She was continued on Neurontin  300 mg t.i.d., was started on Levaquin 250 mg daily, was continued on Synthroid 50 mcg daily, Protonix 40 mg daily.  She was given subcutaneous Lovenox 40 mg daily for DVT prophylaxis.  The patient remained in intensive care primarily because of low blood pressures. She did have pressor agent in order to maintain her blood pressure and she did have additional IV fluids.  She was seen in consultation by Cardiology, who felt that no further cardiac testing would be necessary. She was felt to have sepsis secondary to UTI.  E. coli was cultured from blood.  The bradycardia that she experienced was temporary and improved.  She was able to be moved from intensive care on July 21, 2013 to Med/Surg bed and continued to improve.  She did have some wheezing and was given neb treatments last evening, but this a.m., she is better and felt she could be discharged.     Sanskriti Greenlaw G. Renard MatterMcInnis, MD     AGM/MEDQ  D:  07/22/2013  T:  07/23/2013  Job:  [id number]

## 2013-07-23 NOTE — Progress Notes (Signed)
UR chart review completed.  

## 2013-07-23 NOTE — Discharge Summary (Signed)
NAMGaston Islam:  Bamberg, Suzan                   ACCOUNT NO.:  [id number]  MEDICAL RECORD NO.:  [id number]  LOCATION:  A302                          FACILITY:  [facility]  PHYSICIAN:  Dante Cooter G. Teigen Bellin, MD   DATE OF BIRTH:  1920/11/28  DATE OF ADMISSION:  07/17/2013 DATE OF DISCHARGE:  02/09/2015LH                              DISCHARGE SUMMARY   ADDENDUM:  The patient will be taking the following medications at home: Levaquin 250 mg daily, Neurontin 300 mg 3 times daily, Synthroid 50 mcg daily, multivitamin and minerals once a day, omega-3 fatty acids 1 capsule daily, vitamin D 1 daily, vitamin E 1 daily.  The patient was stable at the time of her discharge and asked to return to primary care physician's office in approximately 1 week.     Tevan Marian G. Renard MatterMcInnis, MD     AGM/MEDQ  D:  07/22/2013  T:  07/23/2013  Job:  [id number]

## 2013-08-28 ENCOUNTER — Encounter (HOSPITAL_COMMUNITY): Payer: Self-pay | Admitting: Emergency Medicine

## 2013-08-28 ENCOUNTER — Emergency Department (HOSPITAL_COMMUNITY): Payer: Medicare Other

## 2013-08-28 ENCOUNTER — Emergency Department (HOSPITAL_COMMUNITY)
Admission: EM | Admit: 2013-08-28 | Discharge: 2013-08-28 | Disposition: A | Discharge status: Home / Self Care | Payer: Medicare Other | Source: Home / Self Care | Attending: Emergency Medicine | Admitting: Emergency Medicine

## 2013-08-28 DIAGNOSIS — Z880 Allergy status to penicillin: Secondary | ICD-10-CM | POA: Insufficient documentation

## 2013-08-28 DIAGNOSIS — E039 Hypothyroidism, unspecified: Secondary | ICD-10-CM

## 2013-08-28 DIAGNOSIS — S298XXA Other specified injuries of thorax, initial encounter: Secondary | ICD-10-CM

## 2013-08-28 DIAGNOSIS — Z792 Long term (current) use of antibiotics: Secondary | ICD-10-CM | POA: Insufficient documentation

## 2013-08-28 DIAGNOSIS — R0789 Other chest pain: Secondary | ICD-10-CM

## 2013-08-28 DIAGNOSIS — I10 Essential (primary) hypertension: Secondary | ICD-10-CM | POA: Insufficient documentation

## 2013-08-28 DIAGNOSIS — Y929 Unspecified place or not applicable: Secondary | ICD-10-CM

## 2013-08-28 DIAGNOSIS — Y9389 Activity, other specified: Secondary | ICD-10-CM

## 2013-08-28 DIAGNOSIS — Z79899 Other long term (current) drug therapy: Secondary | ICD-10-CM

## 2013-08-28 DIAGNOSIS — X500XXA Overexertion from strenuous movement or load, initial encounter: Secondary | ICD-10-CM | POA: Insufficient documentation

## 2013-08-28 DIAGNOSIS — Z8719 Personal history of other diseases of the digestive system: Secondary | ICD-10-CM | POA: Insufficient documentation

## 2013-08-28 DIAGNOSIS — Z8669 Personal history of other diseases of the nervous system and sense organs: Secondary | ICD-10-CM

## 2013-08-28 LAB — COMPREHENSIVE METABOLIC PANEL
ALK PHOS: 59 U/L (ref 39–117)
ALT: 6 U/L (ref 0–35)
AST: 12 U/L (ref 0–37)
Albumin: 2.7 g/dL — ABNORMAL LOW (ref 3.5–5.2)
BUN: 15 mg/dL (ref 6–23)
CHLORIDE: 103 meq/L (ref 96–112)
CO2: 28 meq/L (ref 19–32)
CREATININE: 0.84 mg/dL (ref 0.50–1.10)
Calcium: 8.8 mg/dL (ref 8.4–10.5)
GFR calc Af Amer: 69 mL/min — ABNORMAL LOW (ref >90)
GFR, EST NON AFRICAN AMERICAN: 59 mL/min — AB (ref >90)
Glucose, Bld: 119 mg/dL — ABNORMAL HIGH (ref 70–99)
POTASSIUM: 3.9 meq/L (ref 3.7–5.3)
Sodium: 140 mEq/L (ref 137–147)
Total Bilirubin: 0.5 mg/dL (ref 0.3–1.2)
Total Protein: 6.6 g/dL (ref 6.0–8.3)

## 2013-08-28 LAB — CBC WITH DIFFERENTIAL/PLATELET
Basophils Absolute: 0 10*3/uL (ref 0.0–0.1)
Basophils Relative: 0 % (ref 0–1)
Eosinophils Absolute: 0.1 10*3/uL (ref 0.0–0.7)
Eosinophils Relative: 1 % (ref 0–5)
HCT: 35.5 % — ABNORMAL LOW (ref 36.0–46.0)
HEMOGLOBIN: 11.5 g/dL — AB (ref 12.0–15.0)
LYMPHS PCT: 13 % (ref 12–46)
Lymphs Abs: 1.4 10*3/uL (ref 0.7–4.0)
MCH: 29.3 pg (ref 26.0–34.0)
MCHC: 32.4 g/dL (ref 30.0–36.0)
MCV: 90.3 fL (ref 78.0–100.0)
MONOS PCT: 10 % (ref 3–12)
Monocytes Absolute: 1.1 10*3/uL — ABNORMAL HIGH (ref 0.1–1.0)
NEUTROS ABS: 8.3 10*3/uL — AB (ref 1.7–7.7)
NEUTROS PCT: 76 % (ref 43–77)
Platelets: 317 10*3/uL (ref 150–400)
RBC: 3.93 MIL/uL (ref 3.87–5.11)
RDW: 13.6 % (ref 11.5–15.5)
WBC: 11 10*3/uL — AB (ref 4.0–10.5)

## 2013-08-28 LAB — TROPONIN I: Troponin I: <0.3 ng/mL (ref <0.30)

## 2013-08-28 MED ORDER — MORPHINE SULFATE 4 MG/ML IJ SOLN
4.0000 mg | Freq: Once | INTRAMUSCULAR | Status: AC
  Administered 2013-08-28: 4 mg via INTRAVENOUS
  Filled 2013-08-28: qty 1

## 2013-08-28 MED ORDER — HYDROCODONE-ACETAMINOPHEN 5-325 MG PO TABS: 1.0000 | ORAL_TABLET | ORAL | Status: DC | PRN

## 2013-08-28 NOTE — ED Provider Notes (Signed)
CSN: [id number]     Arrival date & time 08/28/13  [id number] History   First MD Initiated Contact with Patient 08/28/13 0321     Chief Complaint  Patient presents with  . Rib Injury     (Consider location/radiation/quality/duration/timing/severity/associated sxs/prior Treatment) HPI Comments: Patient is a 80 year old female with history of hypertension and hypothyroidism. She presents with complaints of pain in the right chest. She was attempting to get up from the toilet when she developed a sharp pain to her right ribs. She states it felt as if "something grabbed her." She denies falling. The pain is worse with deep breath and palpation and relieved with rest.  Patient is a 80 y.o. female presenting with chest pain. The history is provided by the patient.  Chest Pain Pain location:  R chest Pain quality: sharp   Pain radiates to:  Does not radiate Pain radiates to the back: no   Pain severity:  Severe Onset quality:  Sudden Duration:  1 hour Timing:  Constant Progression:  Unchanged   Past Medical History  Diagnosis Date  . Hypothyroidism   . Peripheral neuropathy   . Shortness of breath   . Hiatal hernia   . Schatzki's ring   . Hypertension    Past Surgical History  Procedure Laterality Date  . Esophagogastroduodenoscopy  10/24/08    normal, [facility], small hiatal hernia deformity of the antrum/pylorus  . Colonoscopy  10/24/08    anal papilla otherwise normal, pan colonic diverticulum and colonic mucosa  . Appendectomy  1941  . Abdominal hysterectomy  1977  . Cholecystectomy  1967  . Spine surgery  1986  . Knee surgery  09/2008  . Hemorrhoid surgery  1999  . Colonoscopy  remote    Dr. Jerolyn ShinLeroy Smith-->polpys per patient  . Ercp  1995    Dr. Rourk--> for dilated biliary tree. no stone or neoplasm or stricture found  . Esophagogastroduodenoscopy  1995    Dr. Rourk--> empiric dilation of esophagus with resolution of dysphagia  . Esophagogastroduodenoscopy    09/21/2011    RMR: Noncritical Schatzki's ring s/p dilated/Abnormal esophageal mucosa/Small  hiatal hermia  . Shoulder surgery     Family History  Problem Relation Age of Onset  . Heart attack Father 4852    deceased   History  Substance Use Topics  . Smoking status: Never Smoker   . Smokeless tobacco: Not on file  . Alcohol Use: No   OB History   Grav Para Term Preterm Abortions TAB SAB Ect Mult Living                 Review of Systems  Cardiovascular: Positive for chest pain.  All other systems reviewed and are negative.      Allergies  Lactulose; Penicillins; and Prilosec  Home Medications   Current Outpatient Rx  Name  Route  Sig  Dispense  Refill  . Cholecalciferol (VITAMIN D PO)   Oral   Take 1 tablet by mouth daily.         Marland Kitchen. gabapentin (NEURONTIN) 300 MG capsule   Oral   Take 300 mg by mouth 3 (three) times daily.         Marland Kitchen. levofloxacin (LEVAQUIN) 250 MG tablet   Oral   Take 1 tablet (250 mg total) by mouth daily.   71 tablet   1   . levothyroxine (SYNTHROID, LEVOTHROID) 50 MCG tablet   Oral   Take 50 mcg by mouth daily.         .Marland Kitchen  Multiple Vitamins-Minerals (MULTIVITAMINS THER. W/MINERALS) TABS tablet   Oral   Take 1 tablet by mouth daily.         . Omega-3 Fatty Acids (FISH OIL PO)   Oral   Take 1 capsule by mouth daily.         Marland Kitchen VITAMIN E PO   Oral   Take 1 tablet by mouth daily.          BP 134/84  Pulse 95  Temp(Src) 97.9 F (36.6 C) (Oral)  Resp 20  Ht 5\' 2"  (1.575 m)  Wt 167 lb (75.751 kg)  BMI 30.54 kg/m2  SpO2 95% Physical Exam  Nursing note and vitals reviewed. Constitutional: She is oriented to person, place, and time. She appears well-developed and well-nourished. No distress.  HENT:  Head: Normocephalic and atraumatic.  Neck: Normal range of motion. Neck supple.  Cardiovascular: Normal rate and regular rhythm.  Exam reveals no gallop and no friction rub.   No murmur heard. Pulmonary/Chest: Effort normal  and breath sounds normal. No respiratory distress. She has no wheezes. She has no rales. She exhibits tenderness.  There is exquisite tenderness to palpation over the right lateral rib cage. There is no crepitus palpable. Breath sounds are clear and equal bilaterally.  Abdominal: Soft. Bowel sounds are normal. She exhibits no distension. There is no tenderness.  Musculoskeletal: Normal range of motion.  Neurological: She is alert and oriented to person, place, and time.  Skin: Skin is warm and dry. She is not diaphoretic.    ED Course  Procedures (including critical care time) Labs Review Labs Reviewed  CBC WITH DIFFERENTIAL  COMPREHENSIVE METABOLIC PANEL  TROPONIN I   Imaging Review No results found.   EKG Interpretation   Date/Time:  Wednesday August 28 2013 03:27:27 EDT Ventricular Rate:  65 PR Interval:    QRS Duration: 78 QT Interval:  416 QTC Calculation: 432 R Axis:   -39 Text Interpretation:  Sinus rhythm with first degree AV block Premature  atrial complexes Left axis deviation Unchanged from 07/19/13 Confirmed by  DELOS  MD, Jnai Snellgrove ([id number]) on 08/28/2013 3:37:06 AM      MDM   Final diagnoses:  None    Patient is a 80 year old female who presents with sudden onset of sharp pain in the right side of her chest. This started when she was getting up from the commode. The pain appears very musculoskeletal in nature. She is exquisitely tender over the right lateral ribs. X-rays do not reveal a fracture but I suspect an occult fracture is present. There is no evidence for pneumothorax on chest x-ray and EKG and laboratory studies are reassuring. She is feeling better with pain medication and appears appropriate for discharge. The patient and the son would prefer to go home. She will be prescribed pain medication and advised to return if her symptoms worsen or change.    Geoffery Lyons, MD 08/28/13 [phone]

## 2013-08-28 NOTE — ED Notes (Signed)
Pt got up to go to the bathroom and when she got up from the commode had a sharp pain to right ribs.  Pt denies falling, states it just happened suddenly..Marland Kitchen

## 2013-08-28 NOTE — Discharge Instructions (Signed)
Hydrocodone as prescribed as needed for pain.  Return to the ER if her pain significantly worsens, or you develop difficulty breathing or other new and concerning symptoms.   Chest Wall Pain Chest wall pain is pain in or around the bones and muscles of your chest. It may take up to 6 weeks to get better. It may take longer if you must stay physically active in your work and activities.  CAUSES  Chest wall pain may happen on its own. However, it may be caused by:  A viral illness like the flu.  Injury.  Coughing.  Exercise.  Arthritis.  Fibromyalgia.  Shingles. HOME CARE INSTRUCTIONS   Avoid overtiring physical activity. Try not to strain or perform activities that cause pain. This includes any activities using your chest or your abdominal and side muscles, especially if heavy weights are used.  Put ice on the sore area.  Put ice in a plastic bag.  Place a towel between your skin and the bag.  Leave the ice on for 15-20 minutes per hour while awake for the first 2 days.  Only take over-the-counter or prescription medicines for pain, discomfort, or fever as directed by your caregiver. SEEK IMMEDIATE MEDICAL CARE IF:   Your pain increases, or you are very uncomfortable.  You have a fever.  Your chest pain becomes worse.  You have new, unexplained symptoms.  You have nausea or vomiting.  You feel sweaty or lightheaded.  You have a cough with phlegm (sputum), or you cough up blood. MAKE SURE YOU:   Understand these instructions.  Will watch your condition.  Will get help right away if you are not doing well or get worse. Document Released: 05/30/2005 Document Revised: 08/22/2011 Document Reviewed: 01/24/2011 [facility] Patient Information 2014 [place], [place].

## 2013-08-30 ENCOUNTER — Emergency Department (HOSPITAL_COMMUNITY): Payer: Medicare Other

## 2013-08-30 ENCOUNTER — Encounter (HOSPITAL_COMMUNITY): Payer: Self-pay | Admitting: Emergency Medicine

## 2013-08-30 ENCOUNTER — Inpatient Hospital Stay (HOSPITAL_COMMUNITY)
Admission: EM | Admit: 2013-08-30 | Discharge: 2013-09-03 | DRG: 690 | Disposition: A | Discharge status: Home Health | Payer: Medicare Other | Attending: Family Medicine | Admitting: Family Medicine

## 2013-08-30 DIAGNOSIS — J069 Acute upper respiratory infection, unspecified: Secondary | ICD-10-CM | POA: Diagnosis present

## 2013-08-30 DIAGNOSIS — J40 Bronchitis, not specified as acute or chronic: Secondary | ICD-10-CM | POA: Diagnosis present

## 2013-08-30 DIAGNOSIS — N390 Urinary tract infection, site not specified: Principal | ICD-10-CM | POA: Diagnosis present

## 2013-08-30 DIAGNOSIS — E039 Hypothyroidism, unspecified: Secondary | ICD-10-CM

## 2013-08-30 DIAGNOSIS — A0472 Enterocolitis due to Clostridium difficile, not specified as recurrent: Secondary | ICD-10-CM | POA: Diagnosis present

## 2013-08-30 DIAGNOSIS — N179 Acute kidney failure, unspecified: Secondary | ICD-10-CM | POA: Diagnosis present

## 2013-08-30 DIAGNOSIS — I10 Essential (primary) hypertension: Secondary | ICD-10-CM | POA: Diagnosis present

## 2013-08-30 DIAGNOSIS — Z8249 Family history of ischemic heart disease and other diseases of the circulatory system: Secondary | ICD-10-CM

## 2013-08-30 DIAGNOSIS — G609 Hereditary and idiopathic neuropathy, unspecified: Secondary | ICD-10-CM | POA: Diagnosis present

## 2013-08-30 DIAGNOSIS — Z66 Do not resuscitate: Secondary | ICD-10-CM | POA: Diagnosis present

## 2013-08-30 LAB — CBC WITH DIFFERENTIAL/PLATELET
BAND NEUTROPHILS: 0 % (ref 0–10)
BLASTS: 0 %
Basophils Absolute: 0 10*3/uL (ref 0.0–0.1)
Basophils Relative: 0 % (ref 0–1)
Eosinophils Absolute: 0.3 10*3/uL (ref 0.0–0.7)
Eosinophils Relative: 1 % (ref 0–5)
HCT: 35.9 % — ABNORMAL LOW (ref 36.0–46.0)
Hemoglobin: 11.9 g/dL — ABNORMAL LOW (ref 12.0–15.0)
Lymphocytes Relative: 8 % — ABNORMAL LOW (ref 12–46)
Lymphs Abs: 2 10*3/uL (ref 0.7–4.0)
MCH: 29 pg (ref 26.0–34.0)
MCHC: 33.1 g/dL (ref 30.0–36.0)
MCV: 87.6 fL (ref 78.0–100.0)
MONO ABS: 1.5 10*3/uL — AB (ref 0.1–1.0)
MONOS PCT: 6 % (ref 3–12)
MYELOCYTES: 0 %
Metamyelocytes Relative: 0 %
Neutro Abs: 21.4 10*3/uL — ABNORMAL HIGH (ref 1.7–7.7)
Neutrophils Relative %: 85 % — ABNORMAL HIGH (ref 43–77)
PLATELETS: 474 10*3/uL — AB (ref 150–400)
Promyelocytes Absolute: 0 %
RBC: 4.1 MIL/uL (ref 3.87–5.11)
RDW: 13.3 % (ref 11.5–15.5)
WBC: 25.2 10*3/uL — AB (ref 4.0–10.5)
nRBC: 0 /100 WBC

## 2013-08-30 LAB — URINALYSIS, ROUTINE W REFLEX MICROSCOPIC
BILIRUBIN URINE: NEGATIVE
Glucose, UA: NEGATIVE mg/dL
KETONES UR: NEGATIVE mg/dL
NITRITE: NEGATIVE
PH: 5.5 (ref 5.0–8.0)
Protein, ur: 30 mg/dL — AB
Specific Gravity, Urine: 1.025 (ref 1.005–1.030)
Urobilinogen, UA: 0.2 mg/dL (ref 0.0–1.0)

## 2013-08-30 LAB — TROPONIN I: Troponin I: <0.3 ng/mL (ref <0.30)

## 2013-08-30 LAB — COMPREHENSIVE METABOLIC PANEL
ALK PHOS: 110 U/L (ref 39–117)
ALT: 12 U/L (ref 0–35)
AST: 15 U/L (ref 0–37)
Albumin: 2.7 g/dL — ABNORMAL LOW (ref 3.5–5.2)
BUN: 20 mg/dL (ref 6–23)
CHLORIDE: 97 meq/L (ref 96–112)
CO2: 22 meq/L (ref 19–32)
Calcium: 9.1 mg/dL (ref 8.4–10.5)
Creatinine, Ser: 1.66 mg/dL — ABNORMAL HIGH (ref 0.50–1.10)
GFR calc Af Amer: 30 mL/min — ABNORMAL LOW (ref >90)
GFR, EST NON AFRICAN AMERICAN: 26 mL/min — AB (ref >90)
Glucose, Bld: 153 mg/dL — ABNORMAL HIGH (ref 70–99)
POTASSIUM: 4.3 meq/L (ref 3.7–5.3)
SODIUM: 134 meq/L — AB (ref 137–147)
Total Bilirubin: 0.5 mg/dL (ref 0.3–1.2)
Total Protein: 6.8 g/dL (ref 6.0–8.3)

## 2013-08-30 LAB — URINE MICROSCOPIC-ADD ON

## 2013-08-30 LAB — LACTIC ACID, PLASMA: Lactic Acid, Venous: 1.2 mmol/L (ref 0.5–2.2)

## 2013-08-30 MED ORDER — LEVOTHYROXINE SODIUM 100 MCG PO TABS
100.0000 ug | ORAL_TABLET | Freq: Every day | ORAL | Status: DC
  Administered 2013-08-31 – 2013-09-03 (×4): 100 ug via ORAL
  Filled 2013-08-30 (×4): qty 1

## 2013-08-30 MED ORDER — SODIUM CHLORIDE 0.9 % IV SOLN
Freq: Once | INTRAVENOUS | Status: AC
  Administered 2013-08-30: 19:00:00 via INTRAVENOUS

## 2013-08-30 MED ORDER — LEVOFLOXACIN IN D5W 500 MG/100ML IV SOLN
500.0000 mg | INTRAVENOUS | Status: DC
  Administered 2013-09-01: 500 mg via INTRAVENOUS
  Filled 2013-08-30: qty 100

## 2013-08-30 MED ORDER — ENOXAPARIN SODIUM 30 MG/0.3ML ~~LOC~~ SOLN
30.0000 mg | SUBCUTANEOUS | Status: DC
  Administered 2013-08-31 – 2013-09-03 (×4): 30 mg via SUBCUTANEOUS
  Filled 2013-08-30 (×4): qty 0.3

## 2013-08-30 MED ORDER — ACETAMINOPHEN 325 MG PO TABS: 650.0000 mg | ORAL_TABLET | Freq: Four times a day (QID) | ORAL | Status: DC | PRN

## 2013-08-30 MED ORDER — HYDROCODONE-ACETAMINOPHEN 5-325 MG PO TABS
1.0000 | ORAL_TABLET | ORAL | Status: DC | PRN
  Administered 2013-09-02 – 2013-09-03 (×2): 1 via ORAL
  Filled 2013-08-30 (×2): qty 1

## 2013-08-30 MED ORDER — GABAPENTIN 300 MG PO CAPS
300.0000 mg | ORAL_CAPSULE | Freq: Three times a day (TID) | ORAL | Status: DC
  Administered 2013-08-30 – 2013-09-03 (×11): 300 mg via ORAL
  Filled 2013-08-30 (×11): qty 1

## 2013-08-30 MED ORDER — ACETAMINOPHEN 650 MG RE SUPP: 650.0000 mg | Freq: Four times a day (QID) | RECTAL | Status: DC | PRN

## 2013-08-30 MED ORDER — CIPROFLOXACIN IN D5W 400 MG/200ML IV SOLN: 400.0000 mg | Freq: Once | INTRAVENOUS | Status: DC

## 2013-08-30 MED ORDER — SODIUM CHLORIDE 0.9 % IV SOLN
INTRAVENOUS | Status: DC
  Administered 2013-08-30 – 2013-09-03 (×5): via INTRAVENOUS

## 2013-08-30 MED ORDER — ONDANSETRON HCL 4 MG/2ML IJ SOLN: 4.0000 mg | Freq: Four times a day (QID) | INTRAMUSCULAR | Status: DC | PRN

## 2013-08-30 MED ORDER — LEVOFLOXACIN IN D5W 750 MG/150ML IV SOLN
750.0000 mg | Freq: Once | INTRAVENOUS | Status: AC
  Administered 2013-08-30: 750 mg via INTRAVENOUS
  Filled 2013-08-30: qty 150

## 2013-08-30 MED ORDER — ONDANSETRON HCL 4 MG PO TABS: 4.0000 mg | ORAL_TABLET | Freq: Four times a day (QID) | ORAL | Status: DC | PRN

## 2013-08-30 NOTE — ED Notes (Signed)
Per ems patient has been weak and feeling bad for a couple of day, spouse tried to bring her to the ED via car and was unsuccessful. Patient slipped out of his arms and hit the ground. Ems was called and found patient to be febrile. Gave motrin 800 mg prior to arrival

## 2013-08-30 NOTE — H&P (Signed)
PCP:   Lanette Hampshire, MD   Chief Complaint:  Lethargy  HPI: 80 year old female who   has a past medical history of Hypothyroidism; Peripheral neuropathy; Shortness of breath; Hiatal hernia; Schatzki's ring; and Hypertension. Today was brought to the emergency department after patient was found to be lethargic the past few days with fever. As per son who provides most of the history patient is usually able to walk with help of walker, she has not been walking over the past 2 days. Also she was found to be little confused she does not have dementia. Patient denies abdominal pain, no nausea vomiting or diarrhea. She complains of right shoulder pain. She denies chest pain no shortness of breath. Her appetite has been good as per patient's son. In the ED she was found to have abnormal UA, leukocytosis with WBC 25,000.  Allergies:   Allergies  Allergen Reactions  . Lactulose     REACTION: unknown reaction  . Penicillins     REACTION: unknown reaction  . Prilosec [Omeprazole] Itching and Rash      Past Medical History  Diagnosis Date  . Hypothyroidism   . Peripheral neuropathy   . Shortness of breath   . Hiatal hernia   . Schatzki's ring   . Hypertension     Past Surgical History  Procedure Laterality Date  . Esophagogastroduodenoscopy  10/24/08    normal, status post [facility] dilator, small hiatal hernia deformity of the antrum/pylorus  . Colonoscopy  10/24/08    anal papilla otherwise normal, pan colonic diverticulum and colonic mucosa  . Appendectomy  1941  . Abdominal hysterectomy  1977  . Cholecystectomy  1967  . Spine surgery  1986  . Knee surgery  09/2008  . Hemorrhoid surgery  1999  . Colonoscopy  remote    Dr. Leane Para Smith-->polpys per patient  . Ercp  1995    Dr. Rourk--> for dilated biliary tree. no stone or neoplasm or stricture found  . Esophagogastroduodenoscopy  1995    Dr. Rourk--> empiric dilation of esophagus with resolution of dysphagia  .  Esophagogastroduodenoscopy   09/21/2011    RMR: Noncritical Schatzki's ring s/p dilated/Abnormal esophageal mucosa/Small  hiatal hermia  . Shoulder surgery      Prior to Admission medications   Medication Sig Start Date End Date Taking? Authorizing Provider  Cholecalciferol (VITAMIN D PO) Take 1 tablet by mouth daily.   Yes Historical Provider, MD  gabapentin (NEURONTIN) 300 MG capsule Take 300 mg by mouth 3 (three) times daily.   Yes Historical Provider, MD  HYDROcodone-acetaminophen (NORCO) 5-325 MG per tablet Take 1 tablet by mouth every 4 (four) hours as needed. 08/28/13  Yes Veryl Speak, MD  levothyroxine (SYNTHROID, LEVOTHROID) 100 MCG tablet Take 100 mcg by mouth daily before breakfast.   Yes Historical Provider, MD  Multiple Vitamins-Minerals (MULTIVITAMINS THER. W/MINERALS) TABS tablet Take 1 tablet by mouth daily.   Yes Historical Provider, MD  Omega-3 Fatty Acids (FISH OIL PO) Take 1 capsule by mouth daily.   Yes Historical Provider, MD  VITAMIN E PO Take 1 tablet by mouth daily.   Yes Historical Provider, MD    Social History:  reports that she has never smoked. She does not have any smokeless tobacco history on file. She reports that she does not drink alcohol or use illicit drugs.  Family History  Problem Relation Age of Onset  . Heart attack Father 71    deceased     All the positives are listed in  BOLD  Review of Systems:  HEENT: Headache, blurred vision, runny nose, sore throat Neck: Hypothyroidism, hyperthyroidism,,lymphadenopathy Chest : Shortness of breath, history of COPD, Asthma Heart : Chest pain, history of coronary arterey disease GI:  Nausea, vomiting, diarrhea, constipation, GERD GU: Dysuria, urgency, frequency of urination, hematuria Neuro: Stroke, seizures, syncope Psych: Depression, anxiety, hallucinations   Physical Exam: Blood pressure 113/62, pulse 116, temperature 99.4 F (37.4 C), temperature source Oral, resp. rate 18, SpO2  92.00%. Constitutional:   Patient is a well-developed and well-nourished *female in no acute distress and cooperative with exam. Head: Normocephalic and atraumatic Mouth: Mucus membranes moist Eyes: PERRL, EOMI, conjunctivae normal Neck: Supple, No Thyromegaly Cardiovascular: RRR, S1 normal, S2 normal Pulmonary/Chest: CTAB, no wheezes, rales, or rhonchi Abdominal: Soft. Non-tender, non-distended, bowel sounds are normal, no masses, organomegaly, or guarding present.  Neurological: A&O x3, Strenght is normal and symmetric bilaterally, cranial nerve II-XII are grossly intact, no focal motor deficit, sensory intact to light touch bilaterally.  Extremities : No Cyanosis, Clubbing or Edema   Labs on Admission:  Results for orders placed during the hospital encounter of 08/30/13 (from the past 48 hour(s))  CBC WITH DIFFERENTIAL     Status: Abnormal   Collection Time    08/30/13  7:24 PM      Result Value Ref Range   WBC 25.2 (*) 4.0 - 10.5 K/uL   RBC 4.10  3.87 - 5.11 MIL/uL   Hemoglobin 11.9 (*) 12.0 - 15.0 g/dL   HCT 35.9 (*) 36.0 - 46.0 %   MCV 87.6  78.0 - 100.0 fL   MCH 29.0  26.0 - 34.0 pg   MCHC 33.1  30.0 - 36.0 g/dL   RDW 13.3  11.5 - 15.5 %   Platelets 474 (*) 150 - 400 K/uL   Neutrophils Relative % 85 (*) 43 - 77 %   Lymphocytes Relative 8 (*) 12 - 46 %   Monocytes Relative 6  3 - 12 %   Eosinophils Relative 1  0 - 5 %   Basophils Relative 0  0 - 1 %   Band Neutrophils 0  0 - 10 %   Metamyelocytes Relative 0     Myelocytes 0     Promyelocytes Absolute 0     Blasts 0     nRBC 0  0 /100 WBC   Neutro Abs 21.4 (*) 1.7 - 7.7 K/uL   Lymphs Abs 2.0  0.7 - 4.0 K/uL   Monocytes Absolute 1.5 (*) 0.1 - 1.0 K/uL   Eosinophils Absolute 0.3  0.0 - 0.7 K/uL   Basophils Absolute 0.0  0.0 - 0.1 K/uL  COMPREHENSIVE METABOLIC PANEL     Status: Abnormal   Collection Time    08/30/13  7:24 PM      Result Value Ref Range   Sodium 134 (*) 137 - 147 mEq/L   Potassium 4.3  3.7 - 5.3  mEq/L   Chloride 97  96 - 112 mEq/L   CO2 22  19 - 32 mEq/L   Glucose, Bld 153 (*) 70 - 99 mg/dL   BUN 20  6 - 23 mg/dL   Creatinine, Ser 1.66 (*) 0.50 - 1.10 mg/dL   Calcium 9.1  8.4 - 10.5 mg/dL   Total Protein 6.8  6.0 - 8.3 g/dL   Albumin 2.7 (*) 3.5 - 5.2 g/dL   AST 15  0 - 37 U/L   ALT 12  0 - 35 U/L   Alkaline Phosphatase 110  39 - 117 U/L   Total Bilirubin 0.5  0.3 - 1.2 mg/dL   GFR calc non Af Amer 26 (*) >90 mL/min   GFR calc Af Amer 30 (*) >90 mL/min   Comment: (NOTE)     The eGFR has been calculated using the CKD EPI equation.     This calculation has not been validated in all clinical situations.     eGFR's persistently <90 mL/min signify possible Chronic Kidney     Disease.  TROPONIN I     Status: None   Collection Time    08/30/13  7:24 PM      Result Value Ref Range   Troponin I <0.30  <0.30 ng/mL   Comment:            Due to the release kinetics of cTnI,     a negative result within the first hours     of the onset of symptoms does not rule out     myocardial infarction with certainty.     If myocardial infarction is still suspected,     repeat the test at appropriate intervals.  URINALYSIS, ROUTINE W REFLEX MICROSCOPIC     Status: Abnormal   Collection Time    08/30/13  7:36 PM      Result Value Ref Range   Color, Urine YELLOW  YELLOW   APPearance CLOUDY (*) CLEAR   Specific Gravity, Urine 1.025  1.005 - 1.030   pH 5.5  5.0 - 8.0   Glucose, UA NEGATIVE  NEGATIVE mg/dL   Hgb urine dipstick SMALL (*) NEGATIVE   Bilirubin Urine NEGATIVE  NEGATIVE   Ketones, ur NEGATIVE  NEGATIVE mg/dL   Protein, ur 30 (*) NEGATIVE mg/dL   Urobilinogen, UA 0.2  0.0 - 1.0 mg/dL   Nitrite NEGATIVE  NEGATIVE   Leukocytes, UA SMALL (*) NEGATIVE  URINE MICROSCOPIC-ADD ON     Status: Abnormal   Collection Time    08/30/13  7:36 PM      Result Value Ref Range   WBC, UA 21-50  <3 WBC/hpf   RBC / HPF 3-6  <3 RBC/hpf   Bacteria, UA MANY (*) RARE  LACTIC ACID, PLASMA      Status: None   Collection Time    08/30/13  8:36 PM      Result Value Ref Range   Lactic Acid, Venous 1.2  0.5 - 2.2 mmol/L    Radiological Exams on Admission: Ct Head Wo Contrast  08/30/2013   CLINICAL DATA:  Weakness, feeling bad for couple days, fever, fell tonight striking head on ground, history hypertension  EXAM: CT HEAD WITHOUT CONTRAST  TECHNIQUE: Contiguous axial images were obtained from the base of the skull through the vertex without intravenous contrast.  COMPARISON:  11/09/2012  FINDINGS: Streak artifacts at skull base.  Generalized atrophy.  Normal ventricular morphology.  No midline shift or mass effect.  Small vessel chronic ischemic changes of deep cerebral white matter.  No intracranial hemorrhage, mass lesion, or acute infarction.  Partial opacification of right ethmoid air cells.  Visualized paranasal sinuses and mastoid air cells otherwise clear.  Bones demineralized.  IMPRESSION: Atrophy with small vessel chronic ischemic changes of deep cerebral white matter.  No acute intracranial abnormalities identified on exam mildly limited by patient motion artifacts.   Electronically Signed   By: Lavonia Dana M.D.   On: 08/30/2013 20:48   Dg Chest Portable 1 View  08/30/2013   CLINICAL DATA:  Fever  EXAM:  PORTABLE CHEST - 1 VIEW  COMPARISON:  DG RIBS UNILATERAL W/CHEST*R* dated 08/28/2013  FINDINGS: Patient is rotated rightward. The cardiac silhouette is enlarged similar prior. There is right basilar atelectasis or potential infiltrate not changed from prior. Left lung is relatively clear. No significant change.  IMPRESSION: Right lower lobe atelectasis versus infiltrate.   Electronically Signed   By: Suzy Bouchard M.D.   On: 08/30/2013 20:21    Assessment/Plan Active Problems:   UTI (lower urinary tract infection)  Acute kidney injury  Patient will will be admitted, has been started on Levaquin in the ED will continue Levaquin and follow the urine culture results. She also has  acute kidney injury with creatinine of 1.66. We'll start IV normal saline at 75 mL per hour and follow BMP in the morning.      Code status: Patient is DO NOT RESUSCITATE  Family discussion: Discussed the patient's sons at bedside   Time Spent on Admission: 60 minutes  [facility] Pager: [phone] 08/30/2013, 9:27 PM  If 7PM-7AM, please contact night-coverage  [email or web address]  Password [email or web address]

## 2013-08-30 NOTE — ED Notes (Signed)
Patient's blood pressure will read low if taken with an electronic vital sign machine. If done manually, it will be higher.

## 2013-08-30 NOTE — ED Provider Notes (Signed)
CSN: [id number]     Arrival date & time 08/30/13  1840 History   First MD Initiated Contact with Patient 08/30/13 1851     Chief Complaint  Patient presents with  . Fever     (Consider location/radiation/quality/duration/timing/severity/associated sxs/prior Treatment) Patient is a 80 y.o. female presenting with fever. The history is provided by a caregiver (the pt has had fever and weakness).  Fever Temp source:  Subjective Severity:  Moderate Onset quality:  Sudden Timing:  Constant Progression:  Waxing and waning Associated symptoms: no chest pain, no congestion, no cough, no diarrhea, no headaches and no rash     Past Medical History  Diagnosis Date  . Hypothyroidism   . Peripheral neuropathy   . Shortness of breath   . Hiatal hernia   . Schatzki's ring   . Hypertension    Past Surgical History  Procedure Laterality Date  . Esophagogastroduodenoscopy  10/24/08    normal, status post [facility] dilator, small hiatal hernia deformity of the antrum/pylorus  . Colonoscopy  10/24/08    anal papilla otherwise normal, pan colonic diverticulum and colonic mucosa  . Appendectomy  1941  . Abdominal hysterectomy  1977  . Cholecystectomy  1967  . Spine surgery  1986  . Knee surgery  09/2008  . Hemorrhoid surgery  1999  . Colonoscopy  remote    Dr. Jerolyn Shin Smith-->polpys per patient  . Ercp  1995    Dr. Rourk--> for dilated biliary tree. no stone or neoplasm or stricture found  . Esophagogastroduodenoscopy  1995    Dr. Rourk--> empiric dilation of esophagus with resolution of dysphagia  . Esophagogastroduodenoscopy   09/21/2011    RMR: Noncritical Schatzki's ring s/p dilated/Abnormal esophageal mucosa/Small  hiatal hermia  . Shoulder surgery     Family History  Problem Relation Age of Onset  . Heart attack Father 63    deceased   History  Substance Use Topics  . Smoking status: Never Smoker   . Smokeless tobacco: Not on file  . Alcohol Use: No   OB History   Grav Para  Term Preterm Abortions TAB SAB Ect Mult Living                 Review of Systems  Constitutional: Positive for fever and fatigue. Negative for appetite change.  HENT: Negative for congestion, ear discharge and sinus pressure.   Eyes: Negative for discharge.  Respiratory: Negative for cough.   Cardiovascular: Negative for chest pain.  Gastrointestinal: Negative for abdominal pain and diarrhea.  Genitourinary: Negative for frequency and hematuria.  Musculoskeletal: Negative for back pain.  Skin: Negative for rash.  Neurological: Negative for seizures and headaches.  Psychiatric/Behavioral: Negative for hallucinations.      Allergies  Lactulose; Penicillins; and Prilosec  Home Medications   Current Outpatient Rx  Name  Route  Sig  Dispense  Refill  . Cholecalciferol (VITAMIN D PO)   Oral   Take 1 tablet by mouth daily.         Marland Kitchen gabapentin (NEURONTIN) 300 MG capsule   Oral   Take 300 mg by mouth 3 (three) times daily.         Marland Kitchen HYDROcodone-acetaminophen (NORCO) 5-325 MG per tablet   Oral   Take 1 tablet by mouth every 4 (four) hours as needed.   20 tablet   0   . levothyroxine (SYNTHROID, LEVOTHROID) 100 MCG tablet   Oral   Take 100 mcg by mouth daily before breakfast.         .  Multiple Vitamins-Minerals (MULTIVITAMINS THER. W/MINERALS) TABS tablet   Oral   Take 1 tablet by mouth daily.         . Omega-3 Fatty Acids (FISH OIL PO)   Oral   Take 1 capsule by mouth daily.         Marland Kitchen. VITAMIN E PO   Oral   Take 1 tablet by mouth daily.          BP 113/62  Pulse 116  Temp(Src) 99.4 F (37.4 C) (Oral)  Resp 18  SpO2 92% Physical Exam  Constitutional: She is oriented to person, place, and time. She appears well-developed.  HENT:  Head: Normocephalic.  Eyes: Conjunctivae and EOM are normal. No scleral icterus.  Neck: Neck supple. No thyromegaly present.  Cardiovascular: Normal rate and regular rhythm.  Exam reveals no gallop and no friction rub.    No murmur heard. Pulmonary/Chest: No stridor. She has no wheezes. She has no rales. She exhibits no tenderness.  Abdominal: She exhibits no distension. There is no tenderness. There is no rebound.  Musculoskeletal: Normal range of motion. She exhibits no edema.  Lymphadenopathy:    She has no cervical adenopathy.  Neurological: She is oriented to person, place, and time. She exhibits normal muscle tone. Coordination normal.  Skin: No rash noted. No erythema.  Psychiatric: She has a normal mood and affect. Her behavior is normal.    ED Course  Procedures (including critical care time) Labs Review Labs Reviewed  CBC WITH DIFFERENTIAL - Abnormal; Notable for the following:    WBC 25.2 (*)    Hemoglobin 11.9 (*)    HCT 35.9 (*)    Platelets 474 (*)    Neutrophils Relative % 85 (*)    Lymphocytes Relative 8 (*)    Neutro Abs 21.4 (*)    Monocytes Absolute 1.5 (*)    All other components within normal limits  COMPREHENSIVE METABOLIC PANEL - Abnormal; Notable for the following:    Sodium 134 (*)    Glucose, Bld 153 (*)    Creatinine, Ser 1.66 (*)    Albumin 2.7 (*)    GFR calc non Af Amer 26 (*)    GFR calc Af Amer 30 (*)    All other components within normal limits  URINALYSIS, ROUTINE W REFLEX MICROSCOPIC - Abnormal; Notable for the following:    APPearance CLOUDY (*)    Hgb urine dipstick SMALL (*)    Protein, ur 30 (*)    Leukocytes, UA SMALL (*)    All other components within normal limits  URINE MICROSCOPIC-ADD ON - Abnormal; Notable for the following:    Bacteria, UA MANY (*)    All other components within normal limits  TROPONIN I  LACTIC ACID, PLASMA   Imaging Review Ct Head Wo Contrast  08/30/2013   CLINICAL DATA:  Weakness, feeling bad for couple days, fever, fell tonight striking head on ground, history hypertension  EXAM: CT HEAD WITHOUT CONTRAST  TECHNIQUE: Contiguous axial images were obtained from the base of the skull through the vertex without intravenous  contrast.  COMPARISON:  11/09/2012  FINDINGS: Streak artifacts at skull base.  Generalized atrophy.  Normal ventricular morphology.  No midline shift or mass effect.  Small vessel chronic ischemic changes of deep cerebral white matter.  No intracranial hemorrhage, mass lesion, or acute infarction.  Partial opacification of right ethmoid air cells.  Visualized paranasal sinuses and mastoid air cells otherwise clear.  Bones demineralized.  IMPRESSION: Atrophy with small vessel  chronic ischemic changes of deep cerebral white matter.  No acute intracranial abnormalities identified on exam mildly limited by patient motion artifacts.   Electronically Signed   By: Ulyses Southward M.D.   On: 08/30/2013 20:48   Dg Chest Portable 1 View  08/30/2013   CLINICAL DATA:  Fever  EXAM: PORTABLE CHEST - 1 VIEW  COMPARISON:  DG RIBS UNILATERAL W/CHEST*R* dated 08/28/2013  FINDINGS: Patient is rotated rightward. The cardiac silhouette is enlarged similar prior. There is right basilar atelectasis or potential infiltrate not changed from prior. Left lung is relatively clear. No significant change.  IMPRESSION: Right lower lobe atelectasis versus infiltrate.   Electronically Signed   By: Genevive Bi M.D.   On: 08/30/2013 20:21     EKG Interpretation   Date/Time:  Friday August 30 2013 19:22:18 EDT Ventricular Rate:  112 PR Interval:    QRS Duration: 76 QT Interval:  334 QTC Calculation: 455 R Axis:   -37 Text Interpretation:  6 Accelerated Junctional rhythm Left axis deviation  Low voltage QRS Inferior infarct (cited on or before 30-Aug-2013) Cannot  rule out Anterior infarct (cited on or before 30-Aug-2013) Abnormal ECG  When compared with ECG of 28-Aug-2013 03:27, Previous ECG has undetermined  rhythm, needs review Confirmed by Camrin Gearheart  MD, Rhet Rorke [phone]) on 08/30/2013  9:06:22 PM      MDM   Final diagnoses:  UTI (lower urinary tract infection)        Benny Lennert, MD 08/30/13 2107

## 2013-08-30 NOTE — Progress Notes (Signed)
ANTIBIOTIC CONSULT NOTE - INITIAL  Pharmacy Consult for Levaquin Indication: UTI  Allergies  Allergen Reactions  . Lactulose     REACTION: unknown reaction  . Penicillins     REACTION: unknown reaction  . Prilosec [Omeprazole] Itching and Rash    Patient Measurements:   Adjusted Body Weight:   Vital Signs: Temp: 99.4 F (37.4 C) (03/20 1857) Temp src: Oral (03/20 1857) BP: 113/62 mmHg (03/20 1857) Pulse Rate: 116 (03/20 1857) Intake/Output from previous day:   Intake/Output from this shift: Total I/O In: -  Out: 30 [Urine:30]  Labs:  Recent Labs  08/28/13 0343 08/30/13 1924  WBC 11.0* 25.2*  HGB 11.5* 11.9*  PLT 317 474*  CREATININE 0.84 1.66*   The CrCl is unknown because both a height and weight (above a minimum accepted value) are required for this calculation. No results found for this basename: VANCOTROUGH, VANCOPEAK, VANCORANDOM, GENTTROUGH, GENTPEAK, GENTRANDOM, TOBRATROUGH, TOBRAPEAK, TOBRARND, AMIKACINPEAK, AMIKACINTROU, AMIKACIN,  in the last 72 hours   Microbiology: No results found for this or any previous visit (from the past 720 hour(s)).  Medical History: Past Medical History  Diagnosis Date  . Hypothyroidism   . Peripheral neuropathy   . Shortness of breath   . Hiatal hernia   . Schatzki's ring   . Hypertension     Medications:  Scheduled:   Assessment: Levaquin 750 mg IV given in ED Calculated CrCl 18 ml/min  Goal of Therapy:  Eradicate infection  Plan:  Levaquin 500 mg IV every 48 hours, next dose 09/01/2013 8 PM Monitor renal function Labs per protocol  Raquel JamesPittman, Jens Siems Bennett 08/30/2013,10:12 PM

## 2013-08-31 LAB — CBC
HEMATOCRIT: 32.1 % — AB (ref 36.0–46.0)
HEMOGLOBIN: 10.5 g/dL — AB (ref 12.0–15.0)
MCH: 29.1 pg (ref 26.0–34.0)
MCHC: 32.7 g/dL (ref 30.0–36.0)
MCV: 88.9 fL (ref 78.0–100.0)
Platelets: 393 10*3/uL (ref 150–400)
RBC: 3.61 MIL/uL — AB (ref 3.87–5.11)
RDW: 13.6 % (ref 11.5–15.5)
WBC: 18.4 10*3/uL — ABNORMAL HIGH (ref 4.0–10.5)

## 2013-08-31 LAB — COMPREHENSIVE METABOLIC PANEL
ALT: 9 U/L (ref 0–35)
AST: 12 U/L (ref 0–37)
Albumin: 2.2 g/dL — ABNORMAL LOW (ref 3.5–5.2)
Alkaline Phosphatase: 97 U/L (ref 39–117)
BILIRUBIN TOTAL: 0.3 mg/dL (ref 0.3–1.2)
BUN: 24 mg/dL — ABNORMAL HIGH (ref 6–23)
CHLORIDE: 101 meq/L (ref 96–112)
CO2: 25 meq/L (ref 19–32)
Calcium: 8.7 mg/dL (ref 8.4–10.5)
Creatinine, Ser: 2.06 mg/dL — ABNORMAL HIGH (ref 0.50–1.10)
GFR calc Af Amer: 23 mL/min — ABNORMAL LOW (ref >90)
GFR, EST NON AFRICAN AMERICAN: 20 mL/min — AB (ref >90)
GLUCOSE: 106 mg/dL — AB (ref 70–99)
POTASSIUM: 4.6 meq/L (ref 3.7–5.3)
SODIUM: 137 meq/L (ref 137–147)
TOTAL PROTEIN: 6.2 g/dL (ref 6.0–8.3)

## 2013-08-31 NOTE — Progress Notes (Signed)
Subjective: The patient is more alert today and feeling better. He was found to be somewhat confused at home and had a fall but no injury but complains of right shoulder pain is found to have an elevated white blood count 25,000 with abnormal urine. It was felt to have urinary tract infection.  Objective: Vital signs in last 24 hours: Temp:  [97.6 F (36.4 C)-99.4 F (37.4 C)] 97.6 F (36.4 C) (03/21 0459) Pulse Rate:  [62-116] 62 (03/21 0459) Resp:  [18] 18 (03/21 0459) BP: (96-113)/(52-62) 96/56 mmHg (03/21 0459) SpO2:  [92 %-96 %] 96 % (03/21 0459) Weight:  [75.796 kg (167 lb 1.6 oz)] 75.796 kg (167 lb 1.6 oz) (03/21 0200) Weight change:  Last BM Date: 08/29/13  Intake/Output from previous day: 03/20 0701 - 03/21 0700 In: 238.8 [I.V.:238.8] Out: 430 [Urine:430] Intake/Output this shift: Total I/O In: 238.8 [I.V.:238.8] Out: 430 [Urine:430]  Physical Exam: Patient is alert and oriented  HEENT negative  Neck supple no JVD or thyroid abnormality  Heart regular rhythm no murmurs  Abdomen the palpable organs or masses  Neurological no focal abnormalities   Recent Labs  08/30/13 1924  WBC 25.2*  HGB 11.9*  HCT 35.9*  PLT 474*   BMET  Recent Labs  08/30/13 1924  NA 134*  K 4.3  CL 97  CO2 22  GLUCOSE 153*  BUN 20  CREATININE 1.66*  CALCIUM 9.1    Studies/Results: Ct Head Wo Contrast  08/30/2013   CLINICAL DATA:  Weakness, feeling bad for couple days, fever, fell tonight striking head on ground, history hypertension  EXAM: CT HEAD WITHOUT CONTRAST  TECHNIQUE: Contiguous axial images were obtained from the base of the skull through the vertex without intravenous contrast.  COMPARISON:  11/09/2012  FINDINGS: Streak artifacts at skull base.  Generalized atrophy.  Normal ventricular morphology.  No midline shift or mass effect.  Small vessel chronic ischemic changes of deep cerebral white matter.  No intracranial hemorrhage, mass lesion, or acute infarction.   Partial opacification of right ethmoid air cells.  Visualized paranasal sinuses and mastoid air cells otherwise clear.  Bones demineralized.  IMPRESSION: Atrophy with small vessel chronic ischemic changes of deep cerebral white matter.  No acute intracranial abnormalities identified on exam mildly limited by patient motion artifacts.   Electronically Signed   By: Ulyses SouthwardMark  Boles M.D.   On: 08/30/2013 20:48   Dg Chest Portable 1 View  08/30/2013   CLINICAL DATA:  Fever  EXAM: PORTABLE CHEST - 1 VIEW  COMPARISON:  DG RIBS UNILATERAL W/CHEST*R* dated 08/28/2013  FINDINGS: Patient is rotated rightward. The cardiac silhouette is enlarged similar prior. There is right basilar atelectasis or potential infiltrate not changed from prior. Left lung is relatively clear. No significant change.  IMPRESSION: Right lower lobe atelectasis versus infiltrate.   Electronically Signed   By: Genevive BiStewart  Edmunds M.D.   On: 08/30/2013 20:21    Medications:  . enoxaparin (LOVENOX) injection  30 mg Subcutaneous Q24H  . gabapentin  300 mg Oral TID  . [START ON 09/01/2013] levofloxacin (LEVAQUIN) IV  500 mg Intravenous Q48H  . levothyroxine  100 mcg Oral QAC breakfast    . sodium chloride 75 mL/hr at 08/31/13 0200     Assessment/Plan: 1. Urinary tract infection with acute kidney injury plan to continue IV Levaquin and await urine culture results if any IV fluids  Cherryl Babin G 08/31/2013, 6:57 AM

## 2013-08-31 NOTE — Plan of Care (Signed)
Problem: Phase I Progression Outcomes Goal: Voiding-avoid urinary catheter unless indicated Outcome: Not Progressing Foley     

## 2013-09-01 NOTE — Progress Notes (Signed)
Subjective: Patient feels better. No fever or chills. Her po intake is adequate.  Objective: Vital signs in last 24 hours: Temp:  [98.2 F (36.8 C)-99.5 F (37.5 C)] 98.3 F (36.8 C) (03/22 0409) Pulse Rate:  [86-111] 99 (03/22 0409) Resp:  [18] 18 (03/22 0409) BP: (126-132)/(57-76) 131/76 mmHg (03/22 0409) SpO2:  [92 %-97 %] 97 % (03/22 0409) Weight:  [78.79 kg (173 lb 11.2 oz)] 78.79 kg (173 lb 11.2 oz) (03/22 0409) Weight change: 2.994 kg (6 lb 9.6 oz) Last BM Date: 08/31/13 (per pt)  Intake/Output from previous day: 03/21 0701 - 03/22 0700 In: 2630 [P.O.:680; I.V.:1950] Out: 540 [Urine:540]  PHYSICAL EXAM General appearance: alert and no distress Resp: clear to auscultation bilaterally Cardio: S1, S2 normal GI: soft, non-tender; bowel sounds normal; no masses,  no organomegaly Extremities: extremities normal, atraumatic, no cyanosis or edema  Lab Results:    @labtest @ ABGS No results found for this basename: PHART, PCO2, PO2ART, TCO2, HCO3,  in the last 72 hours CULTURES No results found for this or any previous visit (from the past 240 hour(s)). Studies/Results: Ct Head Wo Contrast  08/30/2013   CLINICAL DATA:  Weakness, feeling bad for couple days, fever, fell tonight striking head on ground, history hypertension  EXAM: CT HEAD WITHOUT CONTRAST  TECHNIQUE: Contiguous axial images were obtained from the base of the skull through the vertex without intravenous contrast.  COMPARISON:  11/09/2012  FINDINGS: Streak artifacts at skull base.  Generalized atrophy.  Normal ventricular morphology.  No midline shift or mass effect.  Small vessel chronic ischemic changes of deep cerebral white matter.  No intracranial hemorrhage, mass lesion, or acute infarction.  Partial opacification of right ethmoid air cells.  Visualized paranasal sinuses and mastoid air cells otherwise clear.  Bones demineralized.  IMPRESSION: Atrophy with small vessel chronic ischemic changes of deep cerebral  white matter.  No acute intracranial abnormalities identified on exam mildly limited by patient motion artifacts.   Electronically Signed   By: Ulyses SouthwardMark  Boles M.D.   On: 08/30/2013 20:48   Dg Chest Portable 1 View  08/30/2013   CLINICAL DATA:  Fever  EXAM: PORTABLE CHEST - 1 VIEW  COMPARISON:  DG RIBS UNILATERAL W/CHEST*R* dated 08/28/2013  FINDINGS: Patient is rotated rightward. The cardiac silhouette is enlarged similar prior. There is right basilar atelectasis or potential infiltrate not changed from prior. Left lung is relatively clear. No significant change.  IMPRESSION: Right lower lobe atelectasis versus infiltrate.   Electronically Signed   By: Genevive BiStewart  Edmunds M.D.   On: 08/30/2013 20:21    Medications: I have reviewed the patient's current medications.  Assesment:  Active Problems:   UTI (lower urinary tract infection)    Plan: Medications reviewed Continue Iv antibiotics.    LOS: 2 days   [name] 09/01/2013, 9:47 AM

## 2013-09-02 LAB — CBC
HEMATOCRIT: 34.4 % — AB (ref 36.0–46.0)
Hemoglobin: 11.3 g/dL — ABNORMAL LOW (ref 12.0–15.0)
MCH: 29.3 pg (ref 26.0–34.0)
MCHC: 32.8 g/dL (ref 30.0–36.0)
MCV: 89.1 fL (ref 78.0–100.0)
Platelets: 377 10*3/uL (ref 150–400)
RBC: 3.86 MIL/uL — ABNORMAL LOW (ref 3.87–5.11)
RDW: 13.9 % (ref 11.5–15.5)
WBC: 11 10*3/uL — ABNORMAL HIGH (ref 4.0–10.5)

## 2013-09-02 LAB — BASIC METABOLIC PANEL
BUN: 27 mg/dL — AB (ref 6–23)
CO2: 23 mEq/L (ref 19–32)
Calcium: 8.7 mg/dL (ref 8.4–10.5)
Chloride: 103 mEq/L (ref 96–112)
Creatinine, Ser: 1.94 mg/dL — ABNORMAL HIGH (ref 0.50–1.10)
GFR calc non Af Amer: 22 mL/min — ABNORMAL LOW (ref >90)
GFR, EST AFRICAN AMERICAN: 25 mL/min — AB (ref >90)
Glucose, Bld: 107 mg/dL — ABNORMAL HIGH (ref 70–99)
Potassium: 4.1 mEq/L (ref 3.7–5.3)
Sodium: 135 mEq/L — ABNORMAL LOW (ref 137–147)

## 2013-09-02 MED ORDER — LEVOFLOXACIN 500 MG PO TABS: 500.0000 mg | ORAL_TABLET | ORAL | Status: DC

## 2013-09-02 NOTE — Progress Notes (Signed)
Subjective: The patient is more alert today. She states she is congested and has cough she's been treated for urinary tract infection                                                                 .                                                                                                                                                                                                                         Objective: Vital signs in last 24 hours: Temp:  [97.7 F (36.5 C)-98.1 F (36.7 C)] 97.7 F (36.5 C) (03/22 1936) Pulse Rate:  [79-98] 98 (03/22 1936) Resp:  [18] 18 (03/22 1936) BP: (122-136)/(60-83) 136/83 mmHg (03/22 1936) SpO2:  [95 %] 95 % (03/22 1936) Weight change:  Last BM Date: 09/01/13  Intake/Output from previous day: 03/22 0701 - 03/23 0700 In: 2430 [P.O.:480; I.V.:1950] Out: 325 [Urine:325] Intake/Output this shift: Total I/O In: 901.3 [I.V.:901.3] Out: 175 [Urine:175]  Physical Exam:                                                                            General appearance the patient is oriented  Lungs occasional rhonchus heard  Heart regular rhythm no murmurs  Abdomen no palpable organs or masses  Recent Labs  08/30/13 1924 08/31/13 0628  WBC 25.2* 18.4*  HGB 11.9* 10.5*  HCT 35.9* 32.1*  PLT 474* 393   BMET  Recent Labs  08/30/13 1924 08/31/13 0628  NA 134* 137  K 4.3 4.6  CL 97 101  CO2 22 25  GLUCOSE 153* 106*  BUN 20 24*  CREATININE 1.66* 2.06*  CALCIUM 9.1 8.7    Studies/Results: No results found.  Medications:  . enoxaparin (LOVENOX) injection  30 mg Subcutaneous Q24H  . gabapentin  300 mg Oral TID  . levofloxacin (LEVAQUIN) IV  500 mg Intravenous Q48H   . levothyroxine  100 mcg Oral QAC breakfast    . sodium chloride 75 mL/hr at 09/01/13 1259     Assessmen11. Urinary tract infection plan to continue IV antibioticst/Plan:    LOS: 3 days   Walta Bellville G 09/02/2013, 6:23 AM

## 2013-09-02 NOTE — Progress Notes (Signed)
ANTIBIOTIC CONSULT NOTE - follow up  Pharmacy Consult for Levaquin Indication: UTI  Allergies  Allergen Reactions  . Lactulose     REACTION: unknown reaction  . Penicillins     REACTION: unknown reaction  . Prilosec [Omeprazole] Itching and Rash   Patient Measurements: Height: 5\' 2"  (157.5 cm) Weight: 173 lb 11.2 oz (78.79 kg) IBW/kg (Calculated) : 50.1  Vital Signs: Temp: 97.9 F (36.6 C) (03/23 0650) BP: 141/87 mmHg (03/23 0650) Pulse Rate: 93 (03/23 0650) Intake/Output from previous day: 03/22 0701 - 03/23 0700 In: 2430 [P.O.:480; I.V.:1950] Out: 675 [Urine:675] Intake/Output from this shift: Total I/O In: 0  Out: 200 [Urine:200]  Labs:  Recent Labs  08/30/13 1924 08/31/13 0628 09/02/13 0632  WBC 25.2* 18.4* 11.0*  HGB 11.9* 10.5* 11.3*  PLT 474* 393 377  CREATININE 1.66* 2.06* 1.94*   Estimated Creatinine Clearance: 18.7 ml/min (by C-G formula based on Cr of 1.94). No results found for this basename: VANCOTROUGH, Leodis Binet, VANCORANDOM, GENTTROUGH, GENTPEAK, GENTRANDOM, TOBRATROUGH, TOBRAPEAK, TOBRARND, AMIKACINPEAK, AMIKACINTROU, AMIKACIN,  in the last 72 hours   Microbiology: Recent Results (from the past 720 hour(s))  URINE CULTURE     Status: None   Collection Time    08/30/13  9:33 PM      Result Value Ref Range Status   Specimen Description URINE, CATHETERIZED   Final   Special Requests Normal   Final   Culture  Setup Time     Final   Value: 08/31/2013 20:30     Performed at [facility] Count PENDING   Incomplete   Culture     Final   Value: Culture reincubated for better growth     Performed at [facility]   Report Status PENDING   Incomplete   Medical History: Past Medical History  Diagnosis Date  . Hypothyroidism   . Peripheral neuropathy   . Shortness of breath   . Hiatal hernia   . Schatzki's ring   . Hypertension    Medications:  Scheduled:  . enoxaparin (LOVENOX) injection  30 mg Subcutaneous Q24H   . gabapentin  300 mg Oral TID  . [START ON 09/03/2013] levofloxacin  500 mg Oral Q48H  . levothyroxine  100 mcg Oral QAC breakfast   Assessment: 80yo female started on Levaquin for UTI.  PO intake adequate per MD.  Estimated Creatinine Clearance: 18.7 ml/min (by C-G formula based on Cr of 1.94).  Goal of Therapy:  Eradicate infection  Plan:  Levaquin 500 mg PO every 48 hours (renally adjusted) Duration of therapy per MD (anticipate 5-7 days) Monitor renal function Labs per protocol Pharmacy will sign off.  Valrie Hart A 09/02/2013,11:22 AM  PHARMACIST - PHYSICIAN COMMUNICATION DR:   Megan Mans CONCERNING: Antibiotic IV to Oral Route Change Policy  RECOMMENDATION: This patient is receiving Levaquin by the intravenous route.  Based on criteria approved by the Pharmacy and Therapeutics Committee, the antibiotic(s) is/are being converted to the equivalent oral dose form(s).  DESCRIPTION: These criteria include:  Patient being treated for a respiratory tract infection, urinary tract infection, cellulitis or clostridium difficile associated diarrhea if on metronidazole  The patient is not neutropenic and does not exhibit a GI malabsorption state  The patient is eating (either orally or via tube) and/or has been taking other orally administered medications for a least 24 hours  The patient is improving clinically and has a Tmax < 100.5  If you have questions about this conversion, please contact the Pharmacy  Department  [x]   [phone]( [phone] )  [facility] []   [phone]( [phone] )  [facility]  []   [phone]( [phone] )  [facility] []   [phone]( [phone] )  [facility]   S. Margo AyeHall, PharmD

## 2013-09-02 NOTE — Progress Notes (Signed)
Patient's Foley discontinued this AM. Patient unable to void. Bladder scan highest reading 100cc. Dr. Renard MatterMcinnis notified.

## 2013-09-03 LAB — URINE CULTURE: Colony Count: 50000

## 2013-09-03 MED ORDER — LEVOFLOXACIN 500 MG PO TABS: 500.0000 mg | ORAL_TABLET | ORAL | Status: DC

## 2013-09-03 NOTE — Progress Notes (Signed)
Patient discharged home with family.  IV removed - WNL.  Instructed on new medications and when to take.  Emphasized importance of completing whole dose of ABx.  Instructed and educated on proper peri care to prevent recurrent UTIs and to empty bladder frequently.  No questions at this time.  Stable to DC.  Left floor via WC with NT.

## 2013-09-03 NOTE — Progress Notes (Signed)
No urine output this shift. Bladder Scan revealed @ 300 ml. [facility]ed [facility].

## 2013-09-03 NOTE — Discharge Summary (Signed)
Physician Discharge Summary  [name] ZOX:[id number] DOB: [birth year] DOA: 08/30/2013  PCP: [name], MD  Admit date: 08/30/2013 Discharge date: 09/03/2013     Discharge Diagnoses:  1. Acute kidney injury 2. Urinary tract infection with leukocytosis Clostridium difficile 3. Bronchitis 4. Upper respiratory infection Discharge Condition: Good Disposition: Home  Diet recommendation: Regular  Filed Weights   08/31/13 0200 09/01/13 0409 09/03/13 0407  Weight: 75.796 kg (167 lb 1.6 oz) 78.79 kg (173 lb 11.2 oz) 83.326 kg (183 lb 11.2 oz)    History of present illness:  The patient has a history of hypothyroidism peripheral neuropathy hiatal hernia Schatzki's ring and hypertension she was confused his had some shut shoulder pain was admitted through the ED with white blood count 25,000 leukocytosis was felt to have urinary tract infection.  Hospital Course:  The patient placed on IV fluids IV Levaquin urine culture and Clostridium difficile she remained on this antibiotic she did have some cough and congestion which was treated. She gradually improved it was felt that she could be discharged home. She did have help from physical therapy with reference to ambulation.   Discharge Instructions The patient was instructed to continue the following medications. And she was also instructed to return to primary care care physician's office within a fall within the next week. Her creatinine remained slightly elevated    Medication List         FISH OIL PO  Take 1 capsule by mouth daily.     gabapentin 300 MG capsule  Commonly known as:  NEURONTIN  Take 300 mg by mouth 3 (three) times daily.     HYDROcodone-acetaminophen 5-325 MG per tablet  Commonly known as:  NORCO  Take 1 tablet by mouth every 4 (four) hours as needed.     levofloxacin 500 MG tablet  Commonly known as:  LEVAQUIN  Take 1 tablet (500 mg total) by mouth every other day.     levothyroxine 100 MCG tablet   Commonly known as:  SYNTHROID, LEVOTHROID  Take 100 mcg by mouth daily before breakfast.     multivitamins ther. w/minerals Tabs tablet  Take 1 tablet by mouth daily.     VITAMIN D PO  Take 1 tablet by mouth daily.     VITAMIN E PO  Take 1 tablet by mouth daily.       Allergies  Allergen Reactions  . Lactulose     REACTION: unknown reaction  . Penicillins     REACTION: unknown reaction  . Prilosec [Omeprazole] Itching and Rash    The results of significant diagnostics from this hospitalization (including imaging, microbiology, ancillary and laboratory) are listed below for reference.    Significant Diagnostic Studies: Dg Ribs Unilateral W/chest Right  08/28/2013   CLINICAL DATA:  Right anterior chest pain.  EXAM: RIGHT RIBS AND CHEST - 3+ VIEW  COMPARISON:  Single view of the chest 07/17/2013.  FINDINGS: There is some patchy atelectasis in the right lung base. Cardiomegaly is noted. No pneumothorax or pleural effusion. No fracture is identified.  IMPRESSION: Negative for fracture.  No acute finding.  Cardiomegaly.   Electronically Signed   By: Drusilla Kanner M.D.   On: 08/28/2013 04:23   Ct Head Wo Contrast  08/30/2013   CLINICAL DATA:  Weakness, feeling bad for couple days, fever, fell tonight striking head on ground, history hypertension  EXAM: CT HEAD WITHOUT CONTRAST  TECHNIQUE: Contiguous axial images were obtained from the base of the skull through the  vertex without intravenous contrast.  COMPARISON:  11/09/2012  FINDINGS: Streak artifacts at skull base.  Generalized atrophy.  Normal ventricular morphology.  No midline shift or mass effect.  Small vessel chronic ischemic changes of deep cerebral white matter.  No intracranial hemorrhage, mass lesion, or acute infarction.  Partial opacification of right ethmoid air cells.  Visualized paranasal sinuses and mastoid air cells otherwise clear.  Bones demineralized.  IMPRESSION: Atrophy with small vessel chronic ischemic changes of  deep cerebral white matter.  No acute intracranial abnormalities identified on exam mildly limited by patient motion artifacts.   Electronically Signed   By: Ulyses Southward M.D.   On: 08/30/2013 20:48   Dg Chest Portable 1 View  08/30/2013   CLINICAL DATA:  Fever  EXAM: PORTABLE CHEST - 1 VIEW  COMPARISON:  DG RIBS UNILATERAL W/CHEST*R* dated 08/28/2013  FINDINGS: Patient is rotated rightward. The cardiac silhouette is enlarged similar prior. There is right basilar atelectasis or potential infiltrate not changed from prior. Left lung is relatively clear. No significant change.  IMPRESSION: Right lower lobe atelectasis versus infiltrate.   Electronically Signed   By: Genevive Bi M.D.   On: 08/30/2013 20:21    Microbiology: Recent Results (from the past 240 hour(s))  URINE CULTURE     Status: None   Collection Time    08/30/13  9:33 PM      Result Value Ref Range Status   Specimen Description URINE, CATHETERIZED   Final   Special Requests Normal   Final   Culture  Setup Time     Final   Value: 08/31/2013 20:30     Performed at [facility] Count     Final   Value: 50,000 COLONIES/ML     Performed at [facility]   Culture     Final   Value: ENTEROCOCCUS SPECIES     Performed at [facility]   Report Status PENDING   Incomplete     Labs: Basic Metabolic Panel:  Recent Labs Lab 08/28/13 0343 08/30/13 1924 08/31/13 0628 09/02/13 0632  NA 140 134* 137 135*  K 3.9 4.3 4.6 4.1  CL 103 97 101 103  CO2 28 22 25 23   GLUCOSE 119* 153* 106* 107*  BUN 15 20 24* 27*  CREATININE 0.84 1.66* 2.06* 1.94*  CALCIUM 8.8 9.1 8.7 8.7   Liver Function Tests:  Recent Labs Lab 08/28/13 0343 08/30/13 1924 08/31/13 0628  AST 12 15 12   ALT 6 12 9   ALKPHOS 59 110 97  BILITOT 0.5 0.5 0.3  PROT 6.6 6.8 6.2  ALBUMIN 2.7* 2.7* 2.2*   No results found for this basename: LIPASE, AMYLASE,  in the last 168 hours No results found for this basename: AMMONIA,  in  the last 168 hours CBC:  Recent Labs Lab 08/28/13 0343 08/30/13 1924 08/31/13 0628 09/02/13 0632  WBC 11.0* 25.2* 18.4* 11.0*  NEUTROABS 8.3* 21.4*  --   --   HGB 11.5* 11.9* 10.5* 11.3*  HCT 35.5* 35.9* 32.1* 34.4*  MCV 90.3 87.6 88.9 89.1  PLT 317 474* 393 377   Cardiac Enzymes:  Recent Labs Lab 08/28/13 0343 08/30/13 1924  TROPONINI <0.30 <0.30   BNP: BNP (last 3 results) No results found for this basename: PROBNP,  in the last 8760 hours CBG: No results found for this basename: GLUCAP,  in the last 168 hours  Active Problems:   UTI (lower urinary tract infection)   Time coordinating discharge:  30 minutes  Signed:  Butch PennyAngus Mande Auvil, MD 09/03/2013, 6:19 AM

## 2013-09-03 NOTE — Care Management Note (Unsigned)
    Page 1 of 1   09/03/2013     1:37:23 PM   CARE MANAGEMENT NOTE 09/03/2013  Patient:  [name],[name]   Account Number:  [id number]  Date Initiated:  09/03/2013  Documentation initiated by:  Rosemary HolmsOBSON,Cayci Mcnabb  Subjective/Objective Assessment:   Pt lives at home with her son [name]. [facility].     Action/Plan:   Anticipated DC Date:  09/03/2013   Anticipated DC Plan:  HOME W HOME HEALTH SERVICES      DC Planning Services  CM consult      [facility]   Choice offered to / List presented to:  C-4 Adult Children        HH arranged  HH-1 RN  HH-2 PT  HH-4 NURSE'S AIDE      HH agency  [facility]   Status of service:  Completed, signed off Medicare Important Message given?  YES (If response is "NO", the following Medicare IM given date fields will be blank) Date Medicare IM given:  09/03/2013 Date Additional Medicare IM given:    Discharge Disposition:  HOME W HOME HEALTH SERVICES  Per UR Regulation:    If discussed at Long Length of Stay Meetings, dates discussed:    Comments:  09/03/13 Rosemary HolmsAmy Geremy Rister RN BSN CM

## 2013-09-03 NOTE — Progress Notes (Signed)
Physical Therapy Discharge Patient Details Name: [name] MRN: [id number] DOB: 20-Mar-1921 Today's Date: 09/03/2013 Time:  8:20- 8:25    Patient is being discharged from hospital today and is appropriate to be discharged from PT services after being screened by PT. Patient is at prior level of function before being admitted to hospital. Patient is safe and appropriate to discharge home today with continued home health PT services to continue to work on strengthening and balance to improve safety at home with patient receiving 24 hour care from family.   Please see latest therapy progress note for current level of functioning and progress toward goals.    Progress and discharge plan discussed with patient and/or caregiver: patient to be discharged home with continued home health PT and 24 hour assistance from family.  GP     Jenai Scaletta R PT, DPT 09/03/2013, 9:13 AM

## 2014-01-25 ENCOUNTER — Emergency Department (HOSPITAL_COMMUNITY): Payer: Medicare Other

## 2014-01-25 ENCOUNTER — Encounter (HOSPITAL_COMMUNITY): Payer: Self-pay | Admitting: Emergency Medicine

## 2014-01-25 ENCOUNTER — Emergency Department (HOSPITAL_COMMUNITY)
Admission: EM | Admit: 2014-01-25 | Discharge: 2014-01-25 | Disposition: A | Discharge status: Home / Self Care | Payer: Medicare Other | Attending: Emergency Medicine | Admitting: Emergency Medicine

## 2014-01-25 DIAGNOSIS — Z8719 Personal history of other diseases of the digestive system: Secondary | ICD-10-CM | POA: Diagnosis not present

## 2014-01-25 DIAGNOSIS — Z880 Allergy status to penicillin: Secondary | ICD-10-CM | POA: Insufficient documentation

## 2014-01-25 DIAGNOSIS — S20219A Contusion of unspecified front wall of thorax, initial encounter: Secondary | ICD-10-CM | POA: Diagnosis not present

## 2014-01-25 DIAGNOSIS — S3981XA Other specified injuries of abdomen, initial encounter: Secondary | ICD-10-CM | POA: Insufficient documentation

## 2014-01-25 DIAGNOSIS — E039 Hypothyroidism, unspecified: Secondary | ICD-10-CM | POA: Diagnosis not present

## 2014-01-25 DIAGNOSIS — I10 Essential (primary) hypertension: Secondary | ICD-10-CM | POA: Diagnosis not present

## 2014-01-25 DIAGNOSIS — S20212A Contusion of left front wall of thorax, initial encounter: Secondary | ICD-10-CM

## 2014-01-25 DIAGNOSIS — Q393 Congenital stenosis and stricture of esophagus: Secondary | ICD-10-CM

## 2014-01-25 DIAGNOSIS — Y929 Unspecified place or not applicable: Secondary | ICD-10-CM | POA: Insufficient documentation

## 2014-01-25 DIAGNOSIS — Z8669 Personal history of other diseases of the nervous system and sense organs: Secondary | ICD-10-CM | POA: Insufficient documentation

## 2014-01-25 DIAGNOSIS — Q391 Atresia of esophagus with tracheo-esophageal fistula: Secondary | ICD-10-CM | POA: Insufficient documentation

## 2014-01-25 DIAGNOSIS — Y9389 Activity, other specified: Secondary | ICD-10-CM | POA: Diagnosis not present

## 2014-01-25 DIAGNOSIS — R296 Repeated falls: Secondary | ICD-10-CM | POA: Insufficient documentation

## 2014-01-25 DIAGNOSIS — IMO0002 Reserved for concepts with insufficient information to code with codable children: Secondary | ICD-10-CM | POA: Insufficient documentation

## 2014-01-25 DIAGNOSIS — Z79899 Other long term (current) drug therapy: Secondary | ICD-10-CM | POA: Diagnosis not present

## 2014-01-25 DIAGNOSIS — M1710 Unilateral primary osteoarthritis, unspecified knee: Secondary | ICD-10-CM | POA: Diagnosis not present

## 2014-01-25 DIAGNOSIS — M1711 Unilateral primary osteoarthritis, right knee: Secondary | ICD-10-CM

## 2014-01-25 MED ORDER — TRAMADOL HCL 50 MG PO TABS: 50.0000 mg | ORAL_TABLET | Freq: Two times a day (BID) | ORAL | Status: DC | PRN

## 2014-01-25 NOTE — ED Provider Notes (Signed)
CSN: [id number]     Arrival date & time 01/25/14  1905 History   This chart was scribed for Linwood DibblesJon Taiwo Fish, MD by Modena JanskyAlbert Thayil, ED Scribe. This patient was seen in room APA05/APA05 and the patient's care was started at 8:34 PM.   Chief Complaint  Patient presents with  . Flank Pain   The history is provided by the patient and the spouse. No language interpreter was used.   HPI Comments: Sanjuana MaeMary E Eilers is a 80 y.o. female who presents to the Emergency Department complaining of constant moderate left flank pain that started 2 days ago. She states that she fell backwards 2 days ago and that is when her symptoms started. She reports that her pain is exacerbated by breathing and movement. She states that the affected area is tender but not sore. She denies any SOB, dysuria, nausea, emesis, or diarrhea.   Past Medical History  Diagnosis Date  . Hypothyroidism   . Peripheral neuropathy   . Shortness of breath   . Hiatal hernia   . Schatzki's ring   . Hypertension    Past Surgical History  Procedure Laterality Date  . Esophagogastroduodenoscopy  10/24/08    normal, [facility], small hiatal hernia deformity of the antrum/pylorus  . Colonoscopy  10/24/08    anal papilla otherwise normal, pan colonic diverticulum and colonic mucosa  . Appendectomy  1941  . Abdominal hysterectomy  1977  . Cholecystectomy  1967  . Spine surgery  1986  . Knee surgery  09/2008  . Hemorrhoid surgery  1999  . Colonoscopy  remote    Dr. Jerolyn ShinLeroy Smith-->polpys per patient  . Ercp  1995    Dr. Rourk--> for dilated biliary tree. no stone or neoplasm or stricture found  . Esophagogastroduodenoscopy  1995    Dr. Rourk--> empiric dilation of esophagus with resolution of dysphagia  . Esophagogastroduodenoscopy   09/21/2011    RMR: Noncritical Schatzki's ring s/p dilated/Abnormal esophageal mucosa/Small  hiatal hermia  . Shoulder surgery     Family History  Problem Relation Age of Onset  . Heart attack Father  4852    deceased   History  Substance Use Topics  . Smoking status: Never Smoker   . Smokeless tobacco: Not on file  . Alcohol Use: No   OB History   Grav Para Term Preterm Abortions TAB SAB Ect Mult Living                 Review of Systems  Respiratory: Negative for shortness of breath.   Gastrointestinal: Negative for nausea, vomiting and diarrhea.  Genitourinary: Positive for flank pain. Negative for dysuria.  All other systems reviewed and are negative.  Allergies  Lactulose; Penicillins; and Prilosec  Home Medications   Prior to Admission medications   Medication Sig Start Date End Date Taking? Authorizing Provider  gabapentin (NEURONTIN) 300 MG capsule Take 300 mg by mouth 3 (three) times daily.   Yes Historical Provider, MD  levothyroxine (SYNTHROID, LEVOTHROID) 100 MCG tablet Take 100 mcg by mouth daily before breakfast.   Yes Historical Provider, MD   BP 119/76  Pulse 89  Temp(Src) 97.6 F (36.4 C)  Resp 20  Ht 5' 2.5" (1.588 m)  Wt 167 lb (75.751 kg)  BMI 30.04 kg/m2  SpO2 98% Physical Exam  Nursing note and vitals reviewed. Constitutional: She appears well-developed and well-nourished. No distress.  Elderly.   HENT:  Head: Normocephalic and atraumatic. Head is without raccoon's eyes and without Battle's  sign.  Right Ear: External ear normal.  Left Ear: External ear normal.  Eyes: Conjunctivae and lids are normal. Right eye exhibits no discharge. Left eye exhibits no discharge. Right conjunctiva has no hemorrhage. Left conjunctiva has no hemorrhage. No scleral icterus.  Neck: Neck supple. No spinous process tenderness present. No tracheal deviation and no edema present.  Cardiovascular: Normal rate, regular rhythm, normal heart sounds and intact distal pulses.   Pulmonary/Chest: Effort normal and breath sounds normal. No stridor. No respiratory distress. She has no wheezes. She has no rales. She exhibits tenderness and bony tenderness. She exhibits no  crepitus and no deformity.    Abdominal: Soft. Normal appearance and bowel sounds are normal. She exhibits no distension and no mass. There is no tenderness. There is no rebound and no guarding.  Negative for seat belt sign  Musculoskeletal: She exhibits no edema.       Right knee: She exhibits swelling. She exhibits no deformity and no laceration. Tenderness found.       Cervical back: She exhibits no tenderness, no swelling and no deformity.       Thoracic back: She exhibits no tenderness, no swelling and no deformity.       Lumbar back: She exhibits no tenderness and no swelling.  Pelvis stable, no ttp  Neurological: She is alert. She has normal strength. No cranial nerve deficit (no facial droop, extraocular movements intact, no slurred speech) or sensory deficit. She exhibits normal muscle tone. She displays no seizure activity. Coordination normal. GCS eye subscore is 4. GCS verbal subscore is 5. GCS motor subscore is 6.  Able to move all extremities, sensation intact throughout  Skin: Skin is warm and dry. No rash noted. She is not diaphoretic.  Psychiatric: She has a normal mood and affect. Her speech is normal and behavior is normal.    ED Course  Procedures (including critical care time) DIAGNOSTIC STUDIES: Oxygen Saturation is 98% on RA, normal by my interpretation.    COORDINATION OF CARE: 8:38 PM- Pt advised of plan for treatment and pt agrees.  Labs Review Labs Reviewed - No data to display  Imaging Review Dg Ribs Unilateral W/chest Left  01/25/2014   CLINICAL DATA:  Status post fall 2 nights ago.  Left rib pain.  EXAM: LEFT RIBS AND CHEST - 3+ VIEW  COMPARISON:  Single view of the chest 08/30/2013.  FINDINGS: There is mild basilar atelectasis or scar. No pneumothorax or pleural effusion. Cardiomegaly is identified. No fracture is seen.  IMPRESSION: No acute finding.  No fracture is identified.   Electronically Signed   By: Drusilla Kanner M.D.   On: 01/25/2014 21:37    Dg Knee Complete 4 Views Right  01/25/2014   CLINICAL DATA:  Larey Seat 2 nights ago, RIGHT knee pain  EXAM: RIGHT KNEE - COMPLETE 4+ VIEW  COMPARISON:  01/30/2006  FINDINGS: Osseous demineralization.  Tricompartmental osteoarthritic changes with joint space narrowing and marginal spur formation, greatest at medial compartment.  Small knee joint effusion.  No acute fracture, dislocation, or bone destruction.  Question minimal calcific debris versus musculotendinous calcification at suprapatellar recess.  IMPRESSION: Advanced osteoarthritic changes of the RIGHT knee with knee joint effusion and question minimal calcific debris at suprapatellar recess.  No acute fracture or dislocation.   Electronically Signed   By: Ulyses Southward M.D.   On: 01/25/2014 21:37      MDM   Final diagnoses:  Chest wall contusion, left, initial encounter  Osteoarthritis of right knee,  unspecified osteoarthritis type   X-ray did not reveal a pneumothorax or obvious rib fracture. Discussed the findings with the patient and her family. She most likely has a chest wall contusion. I did explain to cannot completely exclude an occult rib fracture. The knee pain is related to osteoarthritis.   Pt is comfortable going home with the family.  I personally performed the services described in this documentation, which was scribed in my presence.  The recorded information has been reviewed and is accurate.     Linwood Dibbles, MD 01/25/14 2216
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
## 2014-01-25 NOTE — ED Notes (Signed)
MD at bedside. 
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
## 2014-03-11 ENCOUNTER — Encounter (HOSPITAL_COMMUNITY): Payer: Self-pay | Admitting: Emergency Medicine

## 2014-03-11 ENCOUNTER — Emergency Department (HOSPITAL_COMMUNITY): Payer: Medicare Other

## 2014-03-11 ENCOUNTER — Emergency Department (HOSPITAL_COMMUNITY)
Admission: EM | Admit: 2014-03-11 | Discharge: 2014-03-11 | Disposition: A | Discharge status: Home / Self Care | Payer: Medicare Other | Attending: Emergency Medicine | Admitting: Emergency Medicine

## 2014-03-11 DIAGNOSIS — E039 Hypothyroidism, unspecified: Secondary | ICD-10-CM | POA: Diagnosis not present

## 2014-03-11 DIAGNOSIS — Z880 Allergy status to penicillin: Secondary | ICD-10-CM | POA: Insufficient documentation

## 2014-03-11 DIAGNOSIS — Z8719 Personal history of other diseases of the digestive system: Secondary | ICD-10-CM | POA: Diagnosis not present

## 2014-03-11 DIAGNOSIS — Y9389 Activity, other specified: Secondary | ICD-10-CM | POA: Insufficient documentation

## 2014-03-11 DIAGNOSIS — Z8781 Personal history of (healed) traumatic fracture: Secondary | ICD-10-CM | POA: Diagnosis not present

## 2014-03-11 DIAGNOSIS — Y92009 Unspecified place in unspecified non-institutional (private) residence as the place of occurrence of the external cause: Secondary | ICD-10-CM | POA: Diagnosis not present

## 2014-03-11 DIAGNOSIS — G609 Hereditary and idiopathic neuropathy, unspecified: Secondary | ICD-10-CM | POA: Diagnosis not present

## 2014-03-11 DIAGNOSIS — Z043 Encounter for examination and observation following other accident: Secondary | ICD-10-CM | POA: Insufficient documentation

## 2014-03-11 DIAGNOSIS — I10 Essential (primary) hypertension: Secondary | ICD-10-CM | POA: Diagnosis not present

## 2014-03-11 DIAGNOSIS — Z87738 Personal history of other specified (corrected) congenital malformations of digestive system: Secondary | ICD-10-CM | POA: Insufficient documentation

## 2014-03-11 DIAGNOSIS — W19XXXA Unspecified fall, initial encounter: Secondary | ICD-10-CM

## 2014-03-11 DIAGNOSIS — W010XXA Fall on same level from slipping, tripping and stumbling without subsequent striking against object, initial encounter: Secondary | ICD-10-CM | POA: Diagnosis not present

## 2014-03-11 DIAGNOSIS — G319 Degenerative disease of nervous system, unspecified: Secondary | ICD-10-CM | POA: Diagnosis not present

## 2014-03-11 DIAGNOSIS — Z79899 Other long term (current) drug therapy: Secondary | ICD-10-CM | POA: Diagnosis not present

## 2014-03-11 HISTORY — DX: Collapsed vertebra, not elsewhere classified, site unspecified, initial encounter for fracture: M48.50XA

## 2014-03-11 NOTE — ED Notes (Signed)
Per EMS, pt fell last night. Pt states she went to sit on her bed and she slipped off. Denies pain. States her son made her come to get checked.

## 2014-03-11 NOTE — ED Notes (Signed)
Pt confused and HOH.  Not able to get history from pt.

## 2014-03-11 NOTE — ED Provider Notes (Signed)
CSN: [id number]     Arrival date & time 03/11/14  1441 History   First MD Initiated Contact with Patient 03/11/14 1453     Chief Complaint  Patient presents with  . Fall     HPI Pt was seen at 1510. Per pt and her son, c/o sudden onset and resolution of one episode of slip and fall last night. [name] states he "heard the fall" and found pt laying on the floor. Pt states she went to sit on her bed and she "slipped off." Pt was able to stand and walk back to her bed after the fall. [name] states he brought her to the ED today "just to get checked out." Pt herself denies any complaints. No reported LOC, no AMS from baseline, no CP/SOB, no abd pain, no N/V/D, no focal motor weakness, no tingling/numbness in extremities.     Past Medical History  Diagnosis Date  . Hypothyroidism   . Peripheral neuropathy   . Shortness of breath   . Hiatal hernia   . Schatzki's ring   . Hypertension   . Vertebral compression fracture    Past Surgical History  Procedure Laterality Date  . Esophagogastroduodenoscopy  10/24/08    normal, [facility], small hiatal hernia deformity of the antrum/pylorus  . Colonoscopy  10/24/08    anal papilla otherwise normal, pan colonic diverticulum and colonic mucosa  . Appendectomy  1941  . Abdominal hysterectomy  1977  . Cholecystectomy  1967  . Spine surgery  1986  . Knee surgery  09/2008  . Hemorrhoid surgery  1999  . Colonoscopy  remote    Dr. Jerolyn ShinLeroy Smith-->polpys per patient  . Ercp  1995    Dr. Rourk--> for dilated biliary tree. no stone or neoplasm or stricture found  . Esophagogastroduodenoscopy  1995    Dr. Rourk--> empiric dilation of esophagus with resolution of dysphagia  . Esophagogastroduodenoscopy   09/21/2011    RMR: Noncritical Schatzki's ring s/p dilated/Abnormal esophageal mucosa/Small  hiatal hermia  . Shoulder surgery     Family History  Problem Relation Age of Onset  . Heart attack Father [id number]    deceased   History   Substance Use Topics  . Smoking status: Never Smoker   . Smokeless tobacco: Not on file  . Alcohol Use: No    Review of Systems ROS: Statement: All systems negative except as marked or noted in the HPI; Constitutional: Negative for fever and chills. ; ; Eyes: Negative for eye pain, redness and discharge. ; ; ENMT: Negative for ear pain, hoarseness, nasal congestion, sinus pressure and sore throat. ; ; Cardiovascular: Negative for chest pain, palpitations, diaphoresis, dyspnea and peripheral edema. ; ; Respiratory: Negative for cough, wheezing and stridor. ; ; Gastrointestinal: Negative for nausea, vomiting, diarrhea, abdominal pain, blood in stool, hematemesis, jaundice and rectal bleeding. . ; ; Genitourinary: Negative for dysuria, flank pain and hematuria. ; ; Musculoskeletal: Negative for back pain and neck pain. Negative for swelling and trauma.; ; Skin: Negative for pruritus, rash, abrasions, blisters, bruising and skin lesion.; ; Neuro: Negative for headache, lightheadedness and neck stiffness. Negative for weakness, altered level of consciousness , altered mental status, extremity weakness, paresthesias, involuntary movement, seizure and syncope.      Allergies  Lactulose; Penicillins; and Prilosec  Home Medications   Prior to Admission medications   Medication Sig Start Date End Date Taking? Authorizing Provider  acetaminophen (PAIN RELIEVER) 325 MG tablet Take 650 mg by mouth every  6 (six) hours as needed for mild pain or moderate pain.   Yes Historical Provider, MD  gabapentin (NEURONTIN) 300 MG capsule Take 300 mg by mouth 3 (three) times daily.   Yes Historical Provider, MD  levothyroxine (SYNTHROID, LEVOTHROID) 100 MCG tablet Take 100 mcg by mouth daily before breakfast.   Yes Historical Provider, MD   BP 151/84  Pulse 91  Resp 23  SpO2 94% Physical Exam 1515: Physical examination: Vital signs and O2 SAT: Reviewed; Constitutional: Well developed, Well nourished, In no acute  distress; Head and Face: Normocephalic, Atraumatic; Eyes: EOMI, PERRL, No scleral icterus; ENMT: Mouth and pharynx normal, Mucous membranes dry; Neck: Supple,Trachea midline; Spine: No midline CS, TS, LS tenderness.; Cardiovascular: Regular rate and rhythm, No gallop; Respiratory: Breath sounds clear & equal bilaterally, No rales, rhonchi, wheezes, Normal respiratory effort/excursion; Chest: Nontender, No deformity, Movement normal, No crepitus, No abrasions or ecchymosis.; Abdomen: Soft, Nontender, Nondistended, Normal bowel sounds, No abrasions or ecchymosis.; Genitourinary: No CVA tenderness;; Extremities: No deformity, Full range of motion major/large joints of bilat UE's and LE's without pain or tenderness to palp, Neurovascularly intact, Pulses normal, No tenderness, No edema, Pelvis stable; Neuro: Awake, alert, mildly confused re: events.  +HOH, otherwise major CN grossly intact. No facial droop. Speech clear. Grips equal. Moves all extremities on stretcher spontaneously and to command without apparent gross focal motor deficits.; Skin: Color normal, Warm, Dry    ED Course  Procedures     MDM  MDM Reviewed: previous chart, nursing note and vitals Interpretation: x-ray and CT scan    Dg Chest 2 View 03/11/2014   CLINICAL DATA:  Status post fall from bed is leading on her back now with low back pain  EXAM: CHEST  2 VIEW  COMPARISON:  Chest x-ray of January 25, 2014  FINDINGS: The lungs are reasonably well inflated. There is minimal stable linear density overlying the lateral aspect of the left hemidiaphragm. The cardiac silhouette is top-normal in size. The pulmonary vascularity is not engorged. There is tortuosity of the ascending and descending thoracic aorta. There is no pleural effusion. There is degenerative disc disease at multiple thoracic levels.  IMPRESSION: There is no evidence of acute cardiopulmonary abnormality. Stable linear density versus scarring is noted above the left  hemidiaphragm. The observed portions of the bony thorax exhibit no acute abnormalities.   Electronically Signed   By: David  [place]   On: 03/11/2014 16:30   Dg Lumbar Spine Complete 03/11/2014   CLINICAL DATA:  Fall.  Lower back pain.  EXAM: LUMBAR SPINE - COMPLETE 4+ VIEW  COMPARISON:  Lumbar spine radiographs 04/10/2012  FINDINGS: The bones are diffusely osteopenic. Approximately 1.5 cm anterolisthesis of L5 on S1 and approximately 1.0 cm anterolisthesis of L4 on L5. The remainder of the lumbar spine vertebral bodies are normally aligned.  Mild to moderate compression fracture of L2 appears stable.  There is a compression deformity of the superior endplate of L1, mild in degree, that was not present on a lumbar spine radiographs 04/10/2012. Mild height loss of the T12 vertebral body appears chronic.  IMPRESSION: 1. Mild superior endplate compression deformity of L1 noted. This was not definitely present on the most recent comparison lumbar spine radiographs dated 04/10/2012. This fracture could be acute or chronic. 2. Stable mild compression deformity of T12. 3. Anterolisthesis of L4 on L5 and of L5 on S1, likely degenerative. 4. Diffuse osteopenia with stable chronic T12 and L2 compression fractures.   Electronically Signed   By:  Britta Mccreedy M.D.   On: 03/11/2014 16:35   Dg Pelvis 1-2 Views 03/11/2014   CLINICAL DATA:  Fall.  EXAM: PELVIS - 1-2 VIEW  COMPARISON:  11/09/2012.  FINDINGS: There is no evidence of pelvic fracture or diastasis. No pelvic bone lesions are seen.  IMPRESSION: Negative.   Electronically Signed   By: Maisie Fus  Register   On: 03/11/2014 16:27   Ct Head Wo Contrast 03/11/2014   CLINICAL DATA:  Fall.  Larey Seat out of bed last night.  EXAM: CT HEAD WITHOUT CONTRAST  CT CERVICAL SPINE WITHOUT CONTRAST  TECHNIQUE: Multidetector CT imaging of the head and cervical spine was performed following the standard protocol without intravenous contrast. Multiplanar CT image reconstructions of the  cervical spine were also generated.  COMPARISON:  CT head 08/30/2013  FINDINGS: CT HEAD FINDINGS  Prominent cerebral volume loss is stable. Chronic microvascular ischemic changes are noted in the periventricular white matter.  Negative for intra or extra-axial hemorrhage, mass effect, mass lesion, or evidence of hydrocephalus. The ventricles are stable in size, and felt to be commensurate with the degree of cerebral atrophy. Some of the images of the skull were repeated due to patient motion. There is mucosal thickening and/or fluid in one of the posterior right ethmoid air cells and there is thickening of two of the anterior ethmoid air cells. These skull is intact. No scalp swelling is seen.  CT CERVICAL SPINE FINDINGS  There is slight degradation of imaging due to patient motion, particularly in the upper cervical spine. There is also some streak artifact from patient's dental fillings. The cervical spine is aligned from the skullbase through the T1 vertebral body. No acute fracture is identified. There is disc height narrowing and posterior osseous spurring at C4-C5, C5-C6, C6-C7.  There are facet joint degenerative changes bilaterally, more prominent on the left side than right.  No prevertebral soft tissue swelling is identified.  IMPRESSION: 1. No acute intracranial abnormality identified. 2. Chronic cerebral atrophy and chronic microvascular ischemic changes. 3. No acute bony abnormality of the cervical spine identified. Slight image degradation of the upper cervical spine due to patient motion. 4. Multilevel degenerative disc disease.   Electronically Signed   By: Britta Mccreedy M.D.   On: 03/11/2014 16:13   Ct Cervical Spine Wo Contrast 03/11/2014   CLINICAL DATA:  Fall.  Larey Seat out of bed last night.  EXAM: CT HEAD WITHOUT CONTRAST  CT CERVICAL SPINE WITHOUT CONTRAST  TECHNIQUE: Multidetector CT imaging of the head and cervical spine was performed following the standard protocol without intravenous  contrast. Multiplanar CT image reconstructions of the cervical spine were also generated.  COMPARISON:  CT head 08/30/2013  FINDINGS: CT HEAD FINDINGS  Prominent cerebral volume loss is stable. Chronic microvascular ischemic changes are noted in the periventricular white matter.  Negative for intra or extra-axial hemorrhage, mass effect, mass lesion, or evidence of hydrocephalus. The ventricles are stable in size, and felt to be commensurate with the degree of cerebral atrophy. Some of the images of the skull were repeated due to patient motion. There is mucosal thickening and/or fluid in one of the posterior right ethmoid air cells and there is thickening of two of the anterior ethmoid air cells. These skull is intact. No scalp swelling is seen.  CT CERVICAL SPINE FINDINGS  There is slight degradation of imaging due to patient motion, particularly in the upper cervical spine. There is also some streak artifact from patient's dental fillings. The cervical spine is aligned  from the skullbase through the T1 vertebral body. No acute fracture is identified. There is disc height narrowing and posterior osseous spurring at C4-C5, C5-C6, C6-C7.  There are facet joint degenerative changes bilaterally, more prominent on the left side than right.  No prevertebral soft tissue swelling is identified.  IMPRESSION: 1. No acute intracranial abnormality identified. 2. Chronic cerebral atrophy and chronic microvascular ischemic changes. 3. No acute bony abnormality of the cervical spine identified. Slight image degradation of the upper cervical spine due to patient motion. 4. Multilevel degenerative disc disease.   Electronically Signed   By: Britta Mccreedy M.D.   On: 03/11/2014 16:13    1655:  Workup reassuring. Pt and her family would like to go home now. Dx and testing d/w pt and family.  Questions answered.  Verb understanding, agreeable to d/c home with outpt f/u.   Samuel Jester, DO 03/12/14 [phone]

## 2014-03-11 NOTE — Discharge Instructions (Signed)
°Emergency Department Resource Guide °1) Find a Doctor and Pay Out of Pocket °Although you won't have to find out who is covered by your insurance plan, it is a good idea to ask around and get recommendations. You will then need to call the office and see if the doctor you have chosen will accept you as a new patient and what types of options they offer for patients who are self-pay. Some doctors offer discounts or will set up payment plans for their patients who do not have insurance, but you will need to ask so you aren't surprised when you get to your appointment. ° °2) Contact Your Local Health Department °Not all health departments have doctors that can see patients for sick visits, but many do, so it is worth a call to see if yours does. If you don't know where your local health department is, you can check in your phone book. The CDC also has a tool to help you locate your state's health department, and many state websites also have listings of all of their local health departments. ° °3) Find a Walk-in Clinic °If your illness is not likely to be very severe or complicated, you may want to try a walk in clinic. These are popping up all over the country in pharmacies, drugstores, and shopping centers. They're usually staffed by nurse practitioners or physician assistants that have been trained to treat common illnesses and complaints. They're usually fairly quick and inexpensive. However, if you have serious medical issues or chronic medical problems, these are probably not your best option. ° °No Primary Care Doctor: °- Call [facility] at  832-8000 - they can help you locate a primary care doctor that  accepts your insurance, provides certain services, etc. °- Physician Referral Service- 1-800-533-3463 ° °Chronic Pain Problems: °Organization         Address  Phone   Notes  °[facility]  (336) 297-2271 Patients need to be referred by their primary care doctor.  ° °Medication  Assistance: °Organization         Address  Phone   Notes  °[place] [facility] [address] °[place], [place] [address] (336) 641-8030 --Must be a resident of [place] °-- Must have NO insurance coverage whatsoever (no Medicaid/ Medicare, etc.) °-- The pt. MUST have a primary care doctor that directs their care regularly and follows them in the community °  °[facility]  (866) 331-1348   °[employer]  (888) 892-1162   ° °Agencies that provide inexpensive medical care: °Organization         Address  Phone   Notes  °[facility]  (336) 832-8035   °[facility]    (336) 832-7272   °[facility] [address] °[place], [place] [address] (336) 832-4777   °[facility] [address], °[place] (336) 271-4999   °[facility]    (336) 373-0678   °[facility]    (336) 272-1050   °[facility] ° [address], [place] Phone:  (336) 832-4444, Fax:  (336) 832-4440 Hours of Operation:  9 am - 6 pm, M-F.  Also accepts Medicaid/Medicare and self-pay.  °[facility] ° [address], [place] Phone: (336) [phone], Fax: (336) [phone]. Hours of Operation:  8:30 am - 5:30 pm, M-F.  Also accepts Medicaid and self-pay.  °[facility] [place] [address], [place] Phone: (336) 878-6027   °[facility] [address], [place], [place] (336)723-1848, Ext. 123 Mondays & Thursdays: 7-9 AM.  First 15 patients are seen on a first come, first serve basis. °  ° °Medicaid-accepting [place] Providers: ° °Organization         Address  Phone   Notes  °[facility] [address], [place] (336) 641-2100 Also accepts self-pay patients.  °[facility] [address], [place] ° (336) 856-9996   °[facility] [address], [place]  (336) 288-8857   °[facility] [address] [place] [address], [place] (336) 299-7000   °Veita Bland [address], [place]  ° (336) 373-1557 Only accepts [place] Access Medicaid patients after they have their name applied to their card.  ° °Self-Pay (no insurance) in [place]: ° °Organization         Address  Phone   Notes  °Sickle Cell Patients, [facility] [address], [place] (336) 832-1970   °[facility] [address], [place] (336) 832-4400   °[facility] ° [address], [place] (336) 992-4800   °[facility]/Dr. Osei-Bonsu ° [address] [place] [address], [place] or [address], [place] (336) [phone] Phone number for both [place] and [place] locations is the same.  °[facility] [address], [place] (336) 299-0000   °[facility] [address] [place] [address], [place] or [address] (336) 852-7530 °(336) 878-2260   °[facility] [address], [place] (336) 350-1642, phone; (336) 294-5005, fax Sees patients 1st and 3rd Saturday of every month.  Must not qualify for public or private insurance (i.e. Medicaid, Medicare, [place] Health Choice, Veterans' Benefits) • Household income should be no more than 200% of the poverty level •The clinic cannot treat you if you are pregnant or think you are pregnant • Sexually transmitted diseases are not treated at the clinic.  ° ° °Dental Care: °Organization         Address  Phone  Notes  °[place] [facility] [address], [place] (336) 641-6152 Accepts children up to age 21 who are enrolled in Medicaid or [place] Health Choice; pregnant women with a Medicaid card; and children who have applied for Medicaid or [place] Health Choice, but were declined, whose parents can pay a reduced fee at time of service.  °[place]  [facility] [place]  [address], [place] (336) 641-7733 Accepts children up to age 21 who are enrolled in Medicaid or [place] Health Choice; pregnant women with a Medicaid card; and children who have applied for Medicaid or  Health Choice, but were declined, whose parents can pay a reduced fee at time of service.  °[facility] ° [address],  (336) 641-4533 Patients are seen by appointment only. Walk-ins are not accepted. [facility] will see patients 18 years of age and older. °Monday - Tuesday (8am-5pm) °Most Wednesdays (8:30-5pm) °$30 per visit, cash only  °[facility] ° [address], [place] (336) 641-4533 Patients are seen by appointment only. Walk-ins are not accepted. [facility] will see patients 18 years of age and older. °One   Wednesday Evening (Monthly: Volunteer Based).  $30 per visit, cash only  °[facility]  (919) 537-3737 for adults; Children under age 4, call Graduate Pediatric Dentistry at ([phone]) [phone]. Children aged 4-14, please call (919) [phone] to request a pediatric application. ° Dental services are provided in all areas of dental care including fillings, crowns and bridges, complete and partial dentures, implants, gum treatment, root canals, and extractions. Preventive care is also provided. Treatment is provided to both adults and children. °Patients are selected via a lottery and there is often a waiting list. °  °[facility] [address], °[place] ° (336) 763-8833 [email or web address] °  °[facility] [address], [place], [place] (336)723-1848, Ext. 123 Second and Fourth Thursday of each month, opens at 6:30 AM; Clinic ends at 9 AM.  Patients are seen on a first-come first-served basis, and a limited number are seen during each clinic.  ° °[facility] ° [address], [place], [place] (336) 723-7904    Eligibility Requirements °You must have lived in [place], [place], or [place] counties for at least the last three months. °  You cannot be eligible for state or federal sponsored healthcare insurance, including Veterans Administration, Medicaid, or Medicare. °  You generally cannot be eligible for healthcare insurance through your employer.  °  How to apply: °Eligibility screenings are held every Tuesday and Wednesday afternoon from 1:00 pm until 4:00 pm. You do not need an appointment for the interview!  °[facility] [address], [place], [place] 336-631-2330   °[facility] [facility]  336-342-8273   °[facility]  336-703-3100   °[facility]  336-570-6415   ° °Behavioral Health Resources in the Community: °Intensive Outpatient Programs °Organization         Address  Phone  Notes  °[facility] [address], [place], [place] 336-878-6098   °[facility] [address], [place], [place] 336-832-9800   °[facility] [address], [place], [place] ° 336-882-2125   °[facility] [address],  °[place], [place] 1-800-853-5163 or 336-641-4981   °Substance Abuse Resources °Organization         Address  Phone  Notes  °[facility]  336-882-2125   °[facility]  336-784-9470   °[facility]  336-285-9073   °[facility]  336-845-3988   °Residential & Outpatient Substance Abuse Program  1-800-659-3381   °Psychological Services °Organization         Address  Phone  Notes  ° Health  336- 832-9600   °[facility]  336- 378-7881   °[facility] [address], [place] 1-800-853-5163 or 336-641-4981   ° °Mobile Crisis Teams °Organization         Address  Phone  Notes  °[facility], Mobile Crisis Care Unit  1-877-626-1772   °Assertive °[facility] ° [address]  [place], [place] 336-834-9664   °Sharon DeEsch [address] ° [place] 336-554-5454   ° °Self-Help/Support Groups °Organization         Address  Phone             Notes  °[facility]  - variety of support groups  336- 373-1402 Call for more information  °Narcotics Anonymous (NA), [facility] [address], °[place]   2 meetings at this location  ° °  Residential Treatment Programs °Organization         Address  Phone  Notes  °[facility] [address],    °[place]  1-866-801-8205   °[facility] ° [address], [place], [place] 704-293-8524   °[facility] [address], [place] 336-845-3988 Admissions: 8am-3pm M-F  °[facility] [address]    °[place], [place] 336-841-1104   °[facility] [address], [place], [place] 336-379-7146   °[facility] [address]  °[place], [place] 336-285-9073   °[facility] Intensive Outpatient [address], [place], [place] 336-852-3033   °[facility] [address]  °[place], [place] 1-877-615-2722 or 336-784-9470   °[facility] [address] [place], [place] 336-227-7417 Accepts Medicaid  °[facility] [address]  °[place] 1-800-659-3381 Substance Abuse/Addiction Treatment  ° °[place] Behavioral Health Resources °Organization         Address  Phone  Notes  °[facility]  (888) 581-9988   °Julie Brannon, PhD [address] [place], [place]   (336) 349-5553 or (336) 951-0000   °[facility]   [address] °[place], [place] (336) 349-4454   °[facility] [address], [place], [place] (336) 342-8316 Insurance/Medicaid/sponsorship through [facility]  °[facility] [address]                                    [place], [place] (336) 342-8316 Therapy/tele-psych/case    °[facility] [address]  ° [place], [place] (336) 349-2233    °Dr. Arfeen  (336) 349-4544   °[facility] [place]  [employer] [place] [facility] 1) [address], [place] °2) [address], [place] °3)  [address], [place] (336) 349-3220 °(336) 342-7768 ° °(336) 342-8140   °[place] [facility] (336) 342-1394 or (336) 342-3537 (After Hours)    ° ° ° °Take your usual prescriptions as previously directed.  Call your regular medical doctor tomorrow to schedule a follow up appointment within the next 2 days. Return to the Emergency Department immediately sooner if worsening.  ° °

## 2014-05-20 ENCOUNTER — Inpatient Hospital Stay (HOSPITAL_COMMUNITY)
Admission: EM | Admit: 2014-05-20 | Discharge: 2014-05-24 | DRG: 694 | Disposition: A | Discharge status: Skilled Nursing Facility | Payer: Medicare Other | Attending: Family Medicine | Admitting: Family Medicine

## 2014-05-20 ENCOUNTER — Inpatient Hospital Stay (HOSPITAL_COMMUNITY): Payer: Medicare Other | Admitting: Anesthesiology

## 2014-05-20 ENCOUNTER — Encounter (HOSPITAL_COMMUNITY): Payer: Self-pay | Admitting: Emergency Medicine

## 2014-05-20 ENCOUNTER — Encounter (HOSPITAL_COMMUNITY)
Admission: EM | Disposition: A | Discharge status: Skilled Nursing Facility | Payer: Self-pay | Source: Home / Self Care | Attending: Family Medicine

## 2014-05-20 ENCOUNTER — Emergency Department (HOSPITAL_COMMUNITY): Payer: Medicare Other

## 2014-05-20 ENCOUNTER — Inpatient Hospital Stay (HOSPITAL_COMMUNITY): Payer: Medicare Other

## 2014-05-20 DIAGNOSIS — Z90710 Acquired absence of both cervix and uterus: Secondary | ICD-10-CM

## 2014-05-20 DIAGNOSIS — E0590 Thyrotoxicosis, unspecified without thyrotoxic crisis or storm: Secondary | ICD-10-CM | POA: Diagnosis present

## 2014-05-20 DIAGNOSIS — N132 Hydronephrosis with renal and ureteral calculous obstruction: Secondary | ICD-10-CM | POA: Diagnosis present

## 2014-05-20 DIAGNOSIS — N136 Pyonephrosis: Secondary | ICD-10-CM | POA: Diagnosis present

## 2014-05-20 DIAGNOSIS — R0602 Shortness of breath: Secondary | ICD-10-CM

## 2014-05-20 DIAGNOSIS — N261 Atrophy of kidney (terminal): Secondary | ICD-10-CM | POA: Diagnosis present

## 2014-05-20 DIAGNOSIS — Z87442 Personal history of urinary calculi: Secondary | ICD-10-CM

## 2014-05-20 DIAGNOSIS — I10 Essential (primary) hypertension: Secondary | ICD-10-CM | POA: Diagnosis present

## 2014-05-20 DIAGNOSIS — I517 Cardiomegaly: Secondary | ICD-10-CM | POA: Diagnosis not present

## 2014-05-20 DIAGNOSIS — R739 Hyperglycemia, unspecified: Secondary | ICD-10-CM | POA: Diagnosis present

## 2014-05-20 DIAGNOSIS — N200 Calculus of kidney: Secondary | ICD-10-CM

## 2014-05-20 DIAGNOSIS — N179 Acute kidney failure, unspecified: Secondary | ICD-10-CM | POA: Diagnosis present

## 2014-05-20 DIAGNOSIS — D72829 Elevated white blood cell count, unspecified: Secondary | ICD-10-CM | POA: Diagnosis present

## 2014-05-20 DIAGNOSIS — Z8249 Family history of ischemic heart disease and other diseases of the circulatory system: Secondary | ICD-10-CM | POA: Diagnosis not present

## 2014-05-20 DIAGNOSIS — E039 Hypothyroidism, unspecified: Secondary | ICD-10-CM | POA: Diagnosis present

## 2014-05-20 DIAGNOSIS — R109 Unspecified abdominal pain: Secondary | ICD-10-CM | POA: Diagnosis present

## 2014-05-20 DIAGNOSIS — R1084 Generalized abdominal pain: Secondary | ICD-10-CM | POA: Diagnosis present

## 2014-05-20 DIAGNOSIS — N390 Urinary tract infection, site not specified: Secondary | ICD-10-CM | POA: Diagnosis present

## 2014-05-20 DIAGNOSIS — R0902 Hypoxemia: Secondary | ICD-10-CM | POA: Diagnosis present

## 2014-05-20 HISTORY — PX: CYSTOSCOPY WITH STENT PLACEMENT: SHX5790

## 2014-05-20 HISTORY — DX: Unspecified osteoarthritis, unspecified site: M19.90

## 2014-05-20 LAB — CBC WITH DIFFERENTIAL/PLATELET
BASOS PCT: 0 % (ref 0–1)
Basophils Absolute: 0 10*3/uL (ref 0.0–0.1)
EOS PCT: 2 % (ref 0–5)
Eosinophils Absolute: 0.3 10*3/uL (ref 0.0–0.7)
HEMATOCRIT: 34.3 % — AB (ref 36.0–46.0)
Hemoglobin: 11.1 g/dL — ABNORMAL LOW (ref 12.0–15.0)
LYMPHS PCT: 14 % (ref 12–46)
Lymphs Abs: 1.8 10*3/uL (ref 0.7–4.0)
MCH: 29 pg (ref 26.0–34.0)
MCHC: 32.4 g/dL (ref 30.0–36.0)
MCV: 89.6 fL (ref 78.0–100.0)
MONO ABS: 0.5 10*3/uL (ref 0.1–1.0)
Monocytes Relative: 4 % (ref 3–12)
Neutro Abs: 10.3 10*3/uL — ABNORMAL HIGH (ref 1.7–7.7)
Neutrophils Relative %: 80 % — ABNORMAL HIGH (ref 43–77)
PLATELETS: 328 10*3/uL (ref 150–400)
RBC: 3.83 MIL/uL — ABNORMAL LOW (ref 3.87–5.11)
RDW: 14.6 % (ref 11.5–15.5)
WBC: 12.8 10*3/uL — ABNORMAL HIGH (ref 4.0–10.5)

## 2014-05-20 LAB — URINALYSIS, ROUTINE W REFLEX MICROSCOPIC
Bilirubin Urine: NEGATIVE
Glucose, UA: NEGATIVE mg/dL
KETONES UR: NEGATIVE mg/dL
NITRITE: NEGATIVE
PROTEIN: 100 mg/dL — AB
Specific Gravity, Urine: 1.02 (ref 1.005–1.030)
Urobilinogen, UA: 0.2 mg/dL (ref 0.0–1.0)
pH: 6 (ref 5.0–8.0)

## 2014-05-20 LAB — COMPREHENSIVE METABOLIC PANEL
ALT: 7 U/L (ref 0–35)
AST: 15 U/L (ref 0–37)
Albumin: 3.2 g/dL — ABNORMAL LOW (ref 3.5–5.2)
Alkaline Phosphatase: 60 U/L (ref 39–117)
Anion gap: 9 (ref 5–15)
BUN: 26 mg/dL — ABNORMAL HIGH (ref 6–23)
CO2: 24 meq/L (ref 19–32)
CREATININE: 1.65 mg/dL — AB (ref 0.50–1.10)
Calcium: 9 mg/dL (ref 8.4–10.5)
Chloride: 108 mEq/L (ref 96–112)
GFR calc Af Amer: 30 mL/min — ABNORMAL LOW (ref >90)
GFR, EST NON AFRICAN AMERICAN: 26 mL/min — AB (ref >90)
Glucose, Bld: 139 mg/dL — ABNORMAL HIGH (ref 70–99)
Potassium: 5.2 mEq/L (ref 3.7–5.3)
Sodium: 141 mEq/L (ref 137–147)
TOTAL PROTEIN: 6.7 g/dL (ref 6.0–8.3)
Total Bilirubin: 0.3 mg/dL (ref 0.3–1.2)

## 2014-05-20 LAB — I-STAT CG4 LACTIC ACID, ED: LACTIC ACID, VENOUS: 0.59 mmol/L (ref 0.5–2.2)

## 2014-05-20 LAB — HEMOGLOBIN A1C
HEMOGLOBIN A1C: 5.6 % (ref <5.7)
Mean Plasma Glucose: 114 mg/dL (ref <117)

## 2014-05-20 LAB — URINE MICROSCOPIC-ADD ON

## 2014-05-20 LAB — LIPASE, BLOOD: LIPASE: 27 U/L (ref 11–59)

## 2014-05-20 SURGERY — CYSTOSCOPY, WITH STENT INSERTION
Anesthesia: General | Site: Ureter | Laterality: Bilateral

## 2014-05-20 MED ORDER — STERILE WATER FOR IRRIGATION IR SOLN
Status: DC | PRN
  Administered 2014-05-20: 3000 mL via INTRAVESICAL

## 2014-05-20 MED ORDER — CIPROFLOXACIN IN D5W 400 MG/200ML IV SOLN
400.0000 mg | INTRAVENOUS | Status: DC
  Administered 2014-05-21: 400 mg via INTRAVENOUS
  Filled 2014-05-20: qty 200

## 2014-05-20 MED ORDER — SODIUM CHLORIDE 0.9 % IV SOLN
INTRAVENOUS | Status: DC
  Administered 2014-05-21 – 2014-05-22 (×4): via INTRAVENOUS

## 2014-05-20 MED ORDER — ALUM & MAG HYDROXIDE-SIMETH 200-200-20 MG/5ML PO SUSP: 30.0000 mL | Freq: Four times a day (QID) | ORAL | Status: DC | PRN

## 2014-05-20 MED ORDER — LEVOTHYROXINE SODIUM 100 MCG PO TABS
100.0000 ug | ORAL_TABLET | Freq: Every day | ORAL | Status: DC
  Administered 2014-05-21: 100 ug via ORAL
  Filled 2014-05-20: qty 1

## 2014-05-20 MED ORDER — HYDROCODONE-ACETAMINOPHEN 5-325 MG PO TABS: 1.0000 | ORAL_TABLET | ORAL | Status: DC | PRN

## 2014-05-20 MED ORDER — STERILE WATER FOR IRRIGATION IR SOLN
Status: DC | PRN
  Administered 2014-05-20: 500 mL

## 2014-05-20 MED ORDER — GABAPENTIN 300 MG PO CAPS
300.0000 mg | ORAL_CAPSULE | Freq: Three times a day (TID) | ORAL | Status: DC
  Administered 2014-05-21 – 2014-05-24 (×10): 300 mg via ORAL
  Filled 2014-05-20 (×11): qty 1

## 2014-05-20 MED ORDER — ONDANSETRON HCL 4 MG/2ML IJ SOLN: 4.0000 mg | Freq: Four times a day (QID) | INTRAMUSCULAR | Status: DC | PRN

## 2014-05-20 MED ORDER — BISACODYL 10 MG RE SUPP: 10.0000 mg | Freq: Every day | RECTAL | Status: DC | PRN

## 2014-05-20 MED ORDER — ONDANSETRON HCL 4 MG/2ML IJ SOLN
4.0000 mg | Freq: Once | INTRAMUSCULAR | Status: AC
  Administered 2014-05-20: 4 mg via INTRAVENOUS
  Filled 2014-05-20: qty 2

## 2014-05-20 MED ORDER — FENTANYL CITRATE 0.05 MG/ML IJ SOLN
INTRAMUSCULAR | Status: DC | PRN
  Administered 2014-05-20 (×2): 25 ug via INTRAVENOUS

## 2014-05-20 MED ORDER — ACETAMINOPHEN 325 MG PO TABS
650.0000 mg | ORAL_TABLET | Freq: Four times a day (QID) | ORAL | Status: DC | PRN
  Administered 2014-05-21: 650 mg via ORAL
  Filled 2014-05-20: qty 2

## 2014-05-20 MED ORDER — LACTATED RINGERS IV SOLN
INTRAVENOUS | Status: DC | PRN
  Administered 2014-05-20: 17:00:00 via INTRAVENOUS

## 2014-05-20 MED ORDER — EPHEDRINE SULFATE 50 MG/ML IJ SOLN
INTRAMUSCULAR | Status: DC | PRN
  Administered 2014-05-20: 5 mg via INTRAVENOUS
  Administered 2014-05-20: 10 mg via INTRAVENOUS

## 2014-05-20 MED ORDER — SODIUM CHLORIDE 0.9 % IJ SOLN
INTRAMUSCULAR | Status: AC
  Filled 2014-05-20: qty 10

## 2014-05-20 MED ORDER — ETOMIDATE 2 MG/ML IV SOLN
INTRAVENOUS | Status: AC
  Filled 2014-05-20: qty 10

## 2014-05-20 MED ORDER — ACETAMINOPHEN 650 MG RE SUPP: 650.0000 mg | Freq: Four times a day (QID) | RECTAL | Status: DC | PRN

## 2014-05-20 MED ORDER — MORPHINE SULFATE 4 MG/ML IJ SOLN
1.0000 mg | INTRAMUSCULAR | Status: DC | PRN
Start: 2014-05-20 — End: 2014-05-24

## 2014-05-20 MED ORDER — FLEET ENEMA 7-19 GM/118ML RE ENEM: 1.0000 | ENEMA | Freq: Once | RECTAL | Status: AC | PRN

## 2014-05-20 MED ORDER — ONDANSETRON HCL 4 MG PO TABS: 4.0000 mg | ORAL_TABLET | Freq: Four times a day (QID) | ORAL | Status: DC | PRN

## 2014-05-20 MED ORDER — MIDAZOLAM HCL 5 MG/5ML IJ SOLN
INTRAMUSCULAR | Status: DC | PRN
  Administered 2014-05-20: .5 mg via INTRAVENOUS

## 2014-05-20 MED ORDER — EPHEDRINE SULFATE 50 MG/ML IJ SOLN
INTRAMUSCULAR | Status: AC
  Filled 2014-05-20: qty 1

## 2014-05-20 MED ORDER — TRAZODONE HCL 50 MG PO TABS
25.0000 mg | ORAL_TABLET | Freq: Every evening | ORAL | Status: DC | PRN
  Filled 2014-05-20: qty 1

## 2014-05-20 MED ORDER — MIDAZOLAM HCL 2 MG/2ML IJ SOLN
INTRAMUSCULAR | Status: AC
  Filled 2014-05-20: qty 2

## 2014-05-20 MED ORDER — CIPROFLOXACIN IN D5W 400 MG/200ML IV SOLN
400.0000 mg | INTRAVENOUS | Status: DC
  Administered 2014-05-20: 400 mg via INTRAVENOUS
  Filled 2014-05-20: qty 200

## 2014-05-20 MED ORDER — FENTANYL CITRATE 0.05 MG/ML IJ SOLN
INTRAMUSCULAR | Status: AC
  Filled 2014-05-20: qty 2

## 2014-05-20 MED ORDER — ETOMIDATE 2 MG/ML IV SOLN
INTRAVENOUS | Status: DC | PRN
  Administered 2014-05-20: 10 mg via INTRAVENOUS
  Administered 2014-05-20: 2 mg via INTRAVENOUS

## 2014-05-20 MED ORDER — SODIUM CHLORIDE 0.9 % IV SOLN
1000.0000 mL | Freq: Once | INTRAVENOUS | Status: AC
  Administered 2014-05-20: 1000 mL via INTRAVENOUS

## 2014-05-20 MED ORDER — FENTANYL CITRATE 0.05 MG/ML IJ SOLN
25.0000 ug | Freq: Once | INTRAMUSCULAR | Status: AC
  Administered 2014-05-20: 25 ug via INTRAVENOUS
  Filled 2014-05-20: qty 2

## 2014-05-20 SURGICAL SUPPLY — 23 items
BAG DRAIN URO TABLE W/ADPT NS (DRAPE) ×3 IMPLANT
BAG DRN 8 ADPR NS SKTRN CSTL (DRAPE) ×1
BAG HAMPER (MISCELLANEOUS) ×3 IMPLANT
CATH INTERMIT  6FR 70CM (CATHETERS) IMPLANT
CATH OPEN TIP 5FR (CATHETERS) ×3 IMPLANT
CATH OPEN TIP 6FR (CATHETERS) ×2 IMPLANT
CATH URET 5FR 28IN CONE TIP (BALLOONS)
CATH URET 5FR 28IN OPEN ENDED (CATHETERS) IMPLANT
CATH URET 5FR 70CM CONE TIP (BALLOONS) IMPLANT
CLOTH BEACON ORANGE TIMEOUT ST (SAFETY) ×3 IMPLANT
DRAPE CAMERA CLOSED 9X96 (DRAPES) ×1 IMPLANT
GLOVE BIO SURGEON STRL SZ8 (GLOVE) ×3 IMPLANT
GOWN STRL REUS W/ TWL LRG LVL3 (GOWN DISPOSABLE) ×1 IMPLANT
GOWN STRL REUS W/TWL LRG LVL3 (GOWN DISPOSABLE) ×3
GOWN STRL REUS W/TWL XL LVL3 (GOWN DISPOSABLE) ×3 IMPLANT
GUIDEWIRE ANG ZIPWIRE 038X150 (WIRE) ×2 IMPLANT
GUIDEWIRE STR DUAL SENSOR (WIRE) IMPLANT
KIT ROOM TURNOVER AP CYSTO (KITS) ×3 IMPLANT
NS IRRIG 500ML POUR BTL (IV SOLUTION) IMPLANT
PACK CYSTO (CUSTOM PROCEDURE TRAY) ×3 IMPLANT
STENT CONTOUR 7FRX24 (STENTS) ×2 IMPLANT
STENT CONTOUR 7FRX26 (STENTS) ×2 IMPLANT
TRAY FOLEY CATH 16FR SILVER (SET/KITS/TRAYS/PACK) ×2 IMPLANT

## 2014-05-20 NOTE — ED Notes (Signed)
Pt unable to tolerated laying in bed because she said it hurts her back.  Pt sitting up in chair beside bed.  Family with pt.

## 2014-05-20 NOTE — Consult Note (Signed)
Urology Consult   Physician requesting consult: Jeraldine LootsLockwood  Reason for consult: Kidney stones  History of Present Illness: [name] is a 80 y.o. female who has a history of kidney stones. Apparently, she has been seen by Dr. Jesse FallJavad in the past. She has had no stone episodes recently. She awoke this morning with abdominal pain, nausea and vomiting. She had no fever or chills. She presented to the emergency room. The patient had pyuria, slight leukocytosis, and abdominal pain. She underwent CT scanning. This revealed large bilateral ureteral stones-a 12 mm right upper ureteral stone and a 20 mm left lower ureteral stone. Urologic consultation is requested.  She denies a history of voiding or storage urinary symptoms, hematuria, UTIs, STDs, urolithiasis, GU malignancy/trauma/surgery.  Past Medical History  Diagnosis Date  . Hypothyroidism   . Peripheral neuropathy   . Shortness of breath   . Hiatal hernia   . Schatzki's ring   . Hypertension   . Vertebral compression fracture     Past Surgical History  Procedure Laterality Date  . Esophagogastroduodenoscopy  10/24/08    normal, status post [facility] dilator, small hiatal hernia deformity of the antrum/pylorus  . Colonoscopy  10/24/08    anal papilla otherwise normal, pan colonic diverticulum and colonic mucosa  . Appendectomy  1941  . Abdominal hysterectomy  1977  . Cholecystectomy  1967  . Spine surgery  1986  . Knee surgery  09/2008  . Hemorrhoid surgery  1999  . Colonoscopy  remote    Dr. Jerolyn ShinLeroy Smith-->polpys per patient  . Ercp  1995    Dr. Rourk--> for dilated biliary tree. no stone or neoplasm or stricture found  . Esophagogastroduodenoscopy  1995    Dr. Rourk--> empiric dilation of esophagus with resolution of dysphagia  . Esophagogastroduodenoscopy   09/21/2011    RMR: Noncritical Schatzki's ring s/p dilated/Abnormal esophageal mucosa/Small  hiatal hermia  . Shoulder surgery       Current Hospital Medications: Scheduled  Meds: Continuous Infusions: PRN Meds:.    Allergies:  Allergies  Allergen Reactions  . Lactulose     REACTION: unknown reaction  . Penicillins     REACTION: unknown reaction  . Prilosec [Omeprazole] Itching and Rash    Family History  Problem Relation Age of Onset  . Heart attack Father 2052    deceased    Social History:  reports that she has never smoked. She does not have any smokeless tobacco history on file. She reports that she does not drink alcohol or use illicit drugs.  ROS: A complete review of systems was performed.  All systems are negative except for pertinent findings as noted.  Physical Exam:  Vital signs in last 24 hours: Temp:  [97.6 F (36.4 C)-99.4 F (37.4 C)] 99.4 F (37.4 C) (12/08 1046) Pulse Rate:  [53-81] 81 (12/08 1210) Resp:  [18-20] 20 (12/08 1148) BP: (98-189)/(52-86) 110/52 mmHg (12/08 1208) SpO2:  [92 %-98 %] 92 % (12/08 1210) General:  She was slightly lethargic. She answered questions appropriately HEENT: Normocephalic, atraumatic Neck: No JVD or lymphadenopathy Cardiovascular: Regular rate and rhythm. There is a grade 2/6 systolic ejection murmur Lungs: Clear bilaterally, although inspiratory effort is low Abdomen: Soft, mild right lower quadrant and right CVA tenderness. No rebound or guarding. Abdomen is protuberant. Back: Right CVA tenderness Extremities: No edema Neurologic: Grossly intact  Laboratory Data:   Recent Labs  05/20/14 0738  WBC 12.8*  HGB 11.1*  HCT 34.3*  PLT 328  Recent Labs  05/20/14 0738  NA 141  K 5.2  CL 108  GLUCOSE 139*  BUN 26*  CALCIUM 9.0  CREATININE 1.65*     Results for orders placed or performed during the hospital encounter of 05/20/14 (from the past 24 hour(s))  CBC with Differential     Status: Abnormal   Collection Time: 05/20/14  7:38 AM  Result Value Ref Range   WBC 12.8 (H) 4.0 - 10.5 K/uL   RBC 3.83 (L) 3.87 - 5.11 MIL/uL   Hemoglobin 11.1 (L) 12.0 - 15.0 g/dL   HCT  16.134.3 (L) 09.636.0 - 46.0 %   MCV 89.6 78.0 - 100.0 fL   MCH 29.0 26.0 - 34.0 pg   MCHC 32.4 30.0 - 36.0 g/dL   RDW 04.514.6 40.911.5 - 81.115.5 %   Platelets 328 150 - 400 K/uL   Neutrophils Relative % 80 (H) 43 - 77 %   Neutro Abs 10.3 (H) 1.7 - 7.7 K/uL   Lymphocytes Relative 14 12 - 46 %   Lymphs Abs 1.8 0.7 - 4.0 K/uL   Monocytes Relative 4 3 - 12 %   Monocytes Absolute 0.5 0.1 - 1.0 K/uL   Eosinophils Relative 2 0 - 5 %   Eosinophils Absolute 0.3 0.0 - 0.7 K/uL   Basophils Relative 0 0 - 1 %   Basophils Absolute 0.0 0.0 - 0.1 K/uL  Comprehensive metabolic panel     Status: Abnormal   Collection Time: 05/20/14  7:38 AM  Result Value Ref Range   Sodium 141 137 - 147 mEq/L   Potassium 5.2 3.7 - 5.3 mEq/L   Chloride 108 96 - 112 mEq/L   CO2 24 19 - 32 mEq/L   Glucose, Bld 139 (H) 70 - 99 mg/dL   BUN 26 (H) 6 - 23 mg/dL   Creatinine, Ser 9.141.65 (H) 0.50 - 1.10 mg/dL   Calcium 9.0 8.4 - 78.210.5 mg/dL   Total Protein 6.7 6.0 - 8.3 g/dL   Albumin 3.2 (L) 3.5 - 5.2 g/dL   AST 15 0 - 37 U/L   ALT 7 0 - 35 U/L   Alkaline Phosphatase 60 39 - 117 U/L   Total Bilirubin 0.3 0.3 - 1.2 mg/dL   GFR calc non Af Amer 26 (L) >90 mL/min   GFR calc Af Amer 30 (L) >90 mL/min   Anion gap 9 5 - 15  Lipase, blood     Status: None   Collection Time: 05/20/14  7:38 AM  Result Value Ref Range   Lipase 27 11 - 59 U/L  I-Stat CG4 Lactic Acid, ED     Status: None   Collection Time: 05/20/14  7:47 AM  Result Value Ref Range   Lactic Acid, Venous 0.59 0.5 - 2.2 mmol/L  Urinalysis, Routine w reflex microscopic     Status: Abnormal   Collection Time: 05/20/14  9:04 AM  Result Value Ref Range   Color, Urine YELLOW YELLOW   APPearance HAZY (A) CLEAR   Specific Gravity, Urine 1.020 1.005 - 1.030   pH 6.0 5.0 - 8.0   Glucose, UA NEGATIVE NEGATIVE mg/dL   Hgb urine dipstick LARGE (A) NEGATIVE   Bilirubin Urine NEGATIVE NEGATIVE   Ketones, ur NEGATIVE NEGATIVE mg/dL   Protein, ur 956100 (A) NEGATIVE mg/dL   Urobilinogen,  UA 0.2 0.0 - 1.0 mg/dL   Nitrite NEGATIVE NEGATIVE   Leukocytes, UA LARGE (A) NEGATIVE  Urine microscopic-add on     Status: Abnormal  Collection Time: 05/20/14  9:04 AM  Result Value Ref Range   WBC, UA TOO NUMEROUS TO COUNT <3 WBC/hpf   RBC / HPF TOO NUMEROUS TO COUNT <3 RBC/hpf   Bacteria, UA MANY (A) RARE   No results found for this or any previous visit (from the past 240 hour(s)).  Renal Function:  Recent Labs  05/20/14 0738  CREATININE 1.65*   CrCl cannot be calculated (Unknown ideal weight.).  Radiologic Imaging: Ct Abdomen Pelvis Wo Contrast  05/20/2014   CLINICAL DATA:  80 year old female with right lower quadrant pain beginning today. Back pain. Initial encounter.  EXAM: CT ABDOMEN AND PELVIS WITHOUT CONTRAST  TECHNIQUE: Multidetector CT imaging of the abdomen and pelvis was performed following the standard protocol without IV contrast.  COMPARISON:  CT Abdomen and Pelvis 03/10/2009.  FINDINGS: Respiratory motion artifact at the lung bases. Chronic cardiomegaly appears increased. No pericardial or pleural effusion. Mildly increased elevation of the left hemidiaphragm. Peribronchial thickening in the lower lobes greater on the left. No consolidation. Tortuous descending thoracic aorta with calcified plaque.  Osteopenia. Stable visualized spine with widespread disc and endplate degeneration. Chronic L2 superior endplate deformity. Chronic L4-L5 spondylolisthesis. Advanced lumbar posterior element degeneration. Motion artifact at the lumbosacral junction. Sacrum and SI joints appear intact. No acute osseous abnormality identified.  No pelvic free fluid. Gas distending the rectum and distal sigmoid colon. Redundant sigmoid. Diverticulosis in the proximal sigmoid, but no active inflammation. Diverticulosis continuing in the descending colon, no definite inflammation. Oral contrast has reached the splenic flexure. Redundant transverse colon. Diverticula at the hepatic flexure with no  definite inflammation. The right colon is decompressed. Oral contrast in some small bowel loops. Some of these measure at the upper limits of normal, but there is no abrupt small bowel transition. Sliding-type hiatal hernia. Stomach is only mildly distended. Gaseous distension of the duodenum.  Surgically absent gallbladder. Stable non contrast liver, spleen, diminutive pancreas, and adrenal glands.  Severe bilateral hydronephrosis. Severe right perinephric stranding and evidence of right para renal space fluid. There is an obstructing proximal right ureteral calculus measuring 8 x 9 x 12 mm located about 6 cm distal to the right ureteropelvic junction. Distal to that calculus the right ureter is decompressed and difficult to delineate. The severe left hydronephrosis is superimposed on a large chronic left upper pole cyst with thin rim calcification. There is severe left hydroureter extending into the left hemipelvis where a large 12 x 12 x 20 mm obstructing calculus is located about 3 cm proximal to the left ureterovesical junction. No abnormality of the bladder identified.  No intraperitoneal fluid. No pneumoperitoneum. Aortoiliac calcified atherosclerosis noted with tortuous abdominal aorta.  IMPRESSION: 1. Bilateral severe acute obstructive uropathy. Bulky right proximal ureter (12 mm calculus 6 cm distal to the right UPJ) and distal left ureter (20 mm calculus 3 cm proximal to the left UVJ). Severe bilateral hydronephrosis. Severe right perinephric stranding likely in part due to forniceal rupture. 2. Perhaps mild secondary inflammation of the duodenum and some small bowel loops. No bowel obstruction. No other acute process identified in the abdomen or pelvis.   Electronically Signed   By: Augusto Gamble M.D.   On: 05/20/2014 11:03    I independently reviewed the above imaging studies.  Impression/Assessment:  Bilateral ureteral stones-12 mm right upper ureteral stone with significant hydronephrosis and  perinephric extravasation, 20 mm left lower pole stone with what appears to be chronic hydronephrosis with left renal atrophy. She has concurrent pyuria.  Plan:  I've spoken with the patient's son. I would suggest urgent stent placement versus percutaneous tube management. I think, with her being here in the hospital, we will attempt cystoscopy and bilateral stent placement. I did discuss the fact that sometimes these large stones are hard to decompress with a stent, as sometimes the stent is hard to place. We will attempt that today, knowing that if stent placement is unsuccessful, she can be transferred for percutaneous nephrostomy tube placement. I will contact the hospitalist about admitting the patient for medical management.

## 2014-05-20 NOTE — ED Provider Notes (Signed)
CSN: [id number]     Arrival date & time 05/20/14  0711 History   First MD Initiated Contact with Patient 05/20/14 [phone]     Chief Complaint  Patient presents with  . Abdominal Pain      HPI  Patient presents with new abdominal pain. Pain began last night, since onset pain has been worsening.  Pain is focally in the right lower quadrant, nonradiating, severe, sharp. There is associated nausea, and one episode of vomiting. No diarrhea, no fever, chills. Patient has a history of abdominal surgery, though she is unsure of what type of surgery. Patient is here with her son who assists with the history of present illness.   Past Medical History  Diagnosis Date  . Hypothyroidism   . Peripheral neuropathy   . Shortness of breath   . Hiatal hernia   . Schatzki's ring   . Hypertension   . Vertebral compression fracture    Past Surgical History  Procedure Laterality Date  . Esophagogastroduodenoscopy  10/24/08    normal, [facility], small hiatal hernia deformity of the antrum/pylorus  . Colonoscopy  10/24/08    anal papilla otherwise normal, pan colonic diverticulum and colonic mucosa  . Appendectomy  1941  . Abdominal hysterectomy  1977  . Cholecystectomy  1967  . Spine surgery  1986  . Knee surgery  09/2008  . Hemorrhoid surgery  1999  . Colonoscopy  remote    Dr. Jerolyn ShinLeroy Smith-->polpys per patient  . Ercp  1995    Dr. Rourk--> for dilated biliary tree. no stone or neoplasm or stricture found  . Esophagogastroduodenoscopy  1995    Dr. Rourk--> empiric dilation of esophagus with resolution of dysphagia  . Esophagogastroduodenoscopy   09/21/2011    RMR: Noncritical Schatzki's ring s/p dilated/Abnormal esophageal mucosa/Small  hiatal hermia  . Shoulder surgery     Family History  Problem Relation Age of Onset  . Heart attack Father 7152    deceased   History  Substance Use Topics  . Smoking status: Never Smoker   . Smokeless tobacco: Not on file  . Alcohol  Use: No   OB History    No data available     Review of Systems  Constitutional:       Per HPI, otherwise negative  HENT:       Per HPI, otherwise negative  Respiratory:       Per HPI, otherwise negative  Cardiovascular:       Per HPI, otherwise negative  Gastrointestinal: Negative for vomiting.  Endocrine:       Negative aside from HPI  Genitourinary:       Neg aside from HPI   Musculoskeletal:       Per HPI, otherwise negative  Skin: Negative.   Neurological: Negative for syncope.      Allergies  Lactulose; Penicillins; and Prilosec  Home Medications   Prior to Admission medications   Medication Sig Start Date End Date Taking? Authorizing Provider  acetaminophen (PAIN RELIEVER) 325 MG tablet Take 650 mg by mouth every 6 (six) hours as needed for mild pain or moderate pain.   Yes Historical Provider, MD  gabapentin (NEURONTIN) 300 MG capsule Take 300 mg by mouth 3 (three) times daily.   Yes Historical Provider, MD  levothyroxine (SYNTHROID, LEVOTHROID) 100 MCG tablet Take 100 mcg by mouth daily before breakfast.   Yes Historical Provider, MD   BP 94/65 mmHg  Pulse 102  Temp(Src) 99.4 F (37.4 C) (Rectal)  Resp 24  SpO2 92% Physical Exam  Constitutional: She is oriented to person, place, and time. She appears well-developed and well-nourished. No distress.  HENT:  Head: Normocephalic and atraumatic.  Eyes: Conjunctivae and EOM are normal.  Cardiovascular: Normal rate and regular rhythm.   Pulmonary/Chest: Effort normal and breath sounds normal. No stridor. No respiratory distress.  Abdominal: She exhibits no distension. There is no hepatosplenomegaly. There is tenderness in the right lower quadrant. There is guarding. There is no rebound.  Musculoskeletal: She exhibits no edema.  Neurological: She is alert and oriented to person, place, and time. No cranial nerve deficit.  Skin: Skin is warm and dry.  Psychiatric: She has a normal mood and affect. Cognition and  memory are impaired.  Nursing note and vitals reviewed.   ED Course  Procedures (including critical care time) Labs Review Labs Reviewed  CBC WITH DIFFERENTIAL - Abnormal; Notable for the following:    WBC 12.8 (*)    RBC 3.83 (*)    Hemoglobin 11.1 (*)    HCT 34.3 (*)    Neutrophils Relative % 80 (*)    Neutro Abs 10.3 (*)    All other components within normal limits  COMPREHENSIVE METABOLIC PANEL - Abnormal; Notable for the following:    Glucose, Bld 139 (*)    BUN 26 (*)    Creatinine, Ser 1.65 (*)    Albumin 3.2 (*)    GFR calc non Af Amer 26 (*)    GFR calc Af Amer 30 (*)    All other components within normal limits  URINALYSIS, ROUTINE W REFLEX MICROSCOPIC - Abnormal; Notable for the following:    APPearance HAZY (*)    Hgb urine dipstick LARGE (*)    Protein, ur 100 (*)    Leukocytes, UA LARGE (*)    All other components within normal limits  URINE MICROSCOPIC-ADD ON - Abnormal; Notable for the following:    Bacteria, UA MANY (*)    All other components within normal limits  LIPASE, BLOOD  I-STAT CG4 LACTIC ACID, ED    Imaging Review Ct Abdomen Pelvis Wo Contrast  05/20/2014   CLINICAL DATA:  80 year old female with right lower quadrant pain beginning today. Back pain. Initial encounter.  EXAM: CT ABDOMEN AND PELVIS WITHOUT CONTRAST  TECHNIQUE: Multidetector CT imaging of the abdomen and pelvis was performed following the standard protocol without IV contrast.  COMPARISON:  CT Abdomen and Pelvis 03/10/2009.  FINDINGS: Respiratory motion artifact at the lung bases. Chronic cardiomegaly appears increased. No pericardial or pleural effusion. Mildly increased elevation of the left hemidiaphragm. Peribronchial thickening in the lower lobes greater on the left. No consolidation. Tortuous descending thoracic aorta with calcified plaque.  Osteopenia. Stable visualized spine with widespread disc and endplate degeneration. Chronic L2 superior endplate deformity. Chronic L4-L5  spondylolisthesis. Advanced lumbar posterior element degeneration. Motion artifact at the lumbosacral junction. Sacrum and SI joints appear intact. No acute osseous abnormality identified.  No pelvic free fluid. Gas distending the rectum and distal sigmoid colon. Redundant sigmoid. Diverticulosis in the proximal sigmoid, but no active inflammation. Diverticulosis continuing in the descending colon, no definite inflammation. Oral contrast has reached the splenic flexure. Redundant transverse colon. Diverticula at the hepatic flexure with no definite inflammation. The right colon is decompressed. Oral contrast in some small bowel loops. Some of these measure at the upper limits of normal, but there is no abrupt small bowel transition. Sliding-type hiatal hernia. Stomach is only mildly distended. Gaseous distension of the duodenum.  Surgically absent gallbladder. Stable non contrast liver, spleen, diminutive pancreas, and adrenal glands.  Severe bilateral hydronephrosis. Severe right perinephric stranding and evidence of right para renal space fluid. There is an obstructing proximal right ureteral calculus measuring 8 x 9 x 12 mm located about 6 cm distal to the right ureteropelvic junction. Distal to that calculus the right ureter is decompressed and difficult to delineate. The severe left hydronephrosis is superimposed on a large chronic left upper pole cyst with thin rim calcification. There is severe left hydroureter extending into the left hemipelvis where a large 12 x 12 x 20 mm obstructing calculus is located about 3 cm proximal to the left ureterovesical junction. No abnormality of the bladder identified.  No intraperitoneal fluid. No pneumoperitoneum. Aortoiliac calcified atherosclerosis noted with tortuous abdominal aorta.  IMPRESSION: 1. Bilateral severe acute obstructive uropathy. Bulky right proximal ureter (12 mm calculus 6 cm distal to the right UPJ) and distal left ureter (20 mm calculus 3 cm proximal  to the left UVJ). Severe bilateral hydronephrosis. Severe right perinephric stranding likely in part due to forniceal rupture. 2. Perhaps mild secondary inflammation of the duodenum and some small bowel loops. No bowel obstruction. No other acute process identified in the abdomen or pelvis.   Electronically Signed   By: Augusto Gamble M.D.   On: 05/20/2014 11:03   Dg Chest Port 1 View  05/20/2014   CLINICAL DATA:  Short of breath at rest  EXAM: PORTABLE CHEST - 1 VIEW  COMPARISON:  03/11/2014  FINDINGS: Hypoventilation with decreased lung volume and bibasilar atelectasis left greater than right. Negative for heart failure or effusion.  IMPRESSION: Hypoventilation with bibasilar atelectasis.   Electronically Signed   By: Marlan Palau M.D.   On: 05/20/2014 13:22     EKG Interpretation None     After the CT results were available and discussed the patient's case with our urology team given the patient's bilateral obstructing stones.  On repeat exam the patient appears calm, having received analgesics.  After discussion with urology the patient was started on Cipro.  MDM   Final diagnoses:  Shortness of breath at rest  Essential hypertension  bilateral obstructing renal stones.  Patient presents with lower abdominal pain, is found to have bilateral obstructing kidney stones. Patient is afebrile, no evidence for sepsis, bacteremia. However, the patient has multiple medical issues, and advanced age. Patient was admitted for further evaluation and management after I discussed her case with urology.   Gerhard Munch, MD 05/20/14 1430

## 2014-05-20 NOTE — ED Notes (Signed)
Report given to Marcelino DusterMichelle, RN for room 315 after stent placement in OR this evening.

## 2014-05-20 NOTE — ED Notes (Signed)
Pt given a recliner to sit it.  Pt says is more comfortable.

## 2014-05-20 NOTE — Anesthesia Postprocedure Evaluation (Signed)
  Anesthesia Post-op Note  Patient: [name]  Procedure(s) Performed: Procedure(s): CYSTOSCOPY WITH BILATERAL DOUBLE J STENT PLACEMENT (Bilateral)  Patient Location: PACU  Anesthesia Type:General  Level of Consciousness: awake  Airway and Oxygen Therapy: Patient Spontanous Breathing and Patient connected to nasal cannula oxygen  Post-op Pain: none  Post-op Assessment: Post-op Vital signs reviewed, Patient's Cardiovascular Status Stable, Respiratory Function Stable, Patent Airway and No signs of Nausea or vomiting  Post-op Vital Signs: Reviewed and stable  102/42, 86,  sat96 on 3 liters, resp. 20  Last Vitals:  Filed Vitals:   05/20/14 1805  BP:   Pulse:   Temp: 37.5 C  Resp:     Complications: No apparent anesthesia complications

## 2014-05-20 NOTE — H&P (Signed)
[facility] History and Physical  [name] ZOX:[id number] DOB: 1920-07-27 DOA: 05/20/2014  Referring physician:  PCP: Alice ReichertMCINNIS,ANGUS G, MD   Chief Complaint: abdominal pain  HPI: [name] MaeMary E Hauck is a very pleasant 80 y.o. female with a past medical history that includes hypertension, hypothyroidism and kidney stones presents to the emergency department with chief complaint of abdominal pain nausea and vomiting. She'll evaluation in the emergency department reveals bilateral ureteral stones with hydronephrosis and acute renal failure.  Patient lives with her son who is at the bedside and reports she has been in her usual state of health until this morning when she awakened with sudden onset of abdominal pain nausea 2 episodes of vomiting. He denied any blood tinged emesis. He denied any evidence of fever or chills. She complained of sharp persistent abdominal pain located mostly in the lower abdomen radiating around to her side on the right. Her no complaints of chest pain palpitation shortness of breath diaphoresis headache syncope or near-syncope. No history of dysuria hematuria frequency or urgency. The patient is continent of bladder and bowel ambulates with a walker.  Workup in the emergency department includes CT of the abdomen/pelvis revealing bilateral severe acute obstructive uropathy. Bulky right proximal ureter (12 mm calculus 6 cm distal to the right UPJ) and distal left ureter (20 mm calculus 3 cm proximal to the left UVJ). Severe bilateral hydronephrosis. Severe right perinephric stranding likely in part due to forniceal rupture. Perhaps mild secondary inflammation of the duodenum and some small bowel loops. No bowel obstruction. No other acute process identified in the abdomen or pelvis, and Pletal metabolic panel revealing a creatinine of 1.65 serum glucose of 139, complete blood count revealing WBCs of 12.8 hemoglobin 11.1, lactic acid 0.59, urinalysis with leukocytes WBCs RBCs too  numerous to count and many bacteria. She had a rectal temp of 99.4 she was hemodynamically stable. In the emergency department she received Zofran fed now on Cipro intravenously as well as normal saline at 125 mils an hour.  Review of Systems:  10 point review of systems completed and all systems are negative except as indicated in the history of present illness  Past Medical History  Diagnosis Date  . Hypothyroidism   . Peripheral neuropathy   . Shortness of breath   . Hiatal hernia   . Schatzki's ring   . Hypertension   . Vertebral compression fracture    Past Surgical History  Procedure Laterality Date  . Esophagogastroduodenoscopy  10/24/08    normal, [facility], small hiatal hernia deformity of the antrum/pylorus  . Colonoscopy  10/24/08    anal papilla otherwise normal, pan colonic diverticulum and colonic mucosa  . Appendectomy  1941  . Abdominal hysterectomy  1977  . Cholecystectomy  1967  . Spine surgery  1986  . Knee surgery  09/2008  . Hemorrhoid surgery  1999  . Colonoscopy  remote    Dr. Jerolyn ShinLeroy Smith-->polpys per patient  . Ercp  1995    Dr. Rourk--> for dilated biliary tree. no stone or neoplasm or stricture found  . Esophagogastroduodenoscopy  1995    Dr. Rourk--> empiric dilation of esophagus with resolution of dysphagia  . Esophagogastroduodenoscopy   09/21/2011    RMR: Noncritical Schatzki's ring s/p dilated/Abnormal esophageal mucosa/Small  hiatal hermia  . Shoulder surgery     Social History:  reports that she has never smoked. She does not have any smokeless tobacco history on file. She reports that she does not drink  alcohol or use illicit drugs. Patient lives with her son she is ambulatory with a walker no recent falls fairly independent with ADLs. Very hard of hearing is able to make her wants and needs known Allergies  Allergen Reactions  . Lactulose     REACTION: unknown reaction  . Penicillins     REACTION: unknown reaction  .  Prilosec [Omeprazole] Itching and Rash    Family History  Problem Relation Age of Onset  . Heart attack Father 452    deceased     Prior to Admission medications   Medication Sig Start Date End Date Taking? Authorizing Provider  acetaminophen (PAIN RELIEVER) 325 MG tablet Take 650 mg by mouth every 6 (six) hours as needed for mild pain or moderate pain.   Yes Historical Provider, MD  gabapentin (NEURONTIN) 300 MG capsule Take 300 mg by mouth 3 (three) times daily.   Yes Historical Provider, MD  levothyroxine (SYNTHROID, LEVOTHROID) 100 MCG tablet Take 100 mcg by mouth daily before breakfast.   Yes Historical Provider, MD   Physical Exam: Filed Vitals:   05/20/14 1148 05/20/14 1208 05/20/14 1210 05/20/14 1330  BP: 98/79 110/52  117/67  Pulse: 72  81 104  Temp:      TempSrc:      Resp: 20   21  SpO2: 92%  92% 92%    Wt Readings from Last 3 Encounters:  01/25/14 75.751 kg (167 lb)  09/03/13 83.326 kg (183 lb 11.2 oz)  08/28/13 75.751 kg (167 lb)    General:  Appears calm and comfortable, somewhat lethargic Eyes: PERRL, normal lids, irises & conjunctiva ENT: grossly normal hearing, mucous membranes of her mouth are slightly pale and dry Neck: no LAD, masses or thyromegaly Cardiovascular: Tachycardic but regular no m/r/g. No LE edema.  Respiratory: CTA bilaterally, no w/r/r. Normal respiratory effort. Abdomen: soft, ntnd positive bowel sounds Skin: no rash or induration seen on limited exam Musculoskeletal: grossly normal tone BUE/BLE Psychiatric: grossly normal mood and affect, speech fluent and appropriate Neurologic: grossly non-focal. Slow to respond but responds appropriately somewhat lethargic from pain medicine. Speech clear           Labs on Admission:  Basic Metabolic Panel:  Recent Labs Lab 05/20/14 0738  NA 141  K 5.2  CL 108  CO2 24  GLUCOSE 139*  BUN 26*  CREATININE 1.65*  CALCIUM 9.0   Liver Function Tests:  Recent Labs Lab 05/20/14 0738  AST  15  ALT 7  ALKPHOS 60  BILITOT 0.3  PROT 6.7  ALBUMIN 3.2*    Recent Labs Lab 05/20/14 0738  LIPASE 27   No results for input(s): AMMONIA in the last 168 hours. CBC:  Recent Labs Lab 05/20/14 0738  WBC 12.8*  NEUTROABS 10.3*  HGB 11.1*  HCT 34.3*  MCV 89.6  PLT 328   Cardiac Enzymes: No results for input(s): CKTOTAL, CKMB, CKMBINDEX, TROPONINI in the last 168 hours.  BNP (last 3 results) No results for input(s): PROBNP in the last 8760 hours. CBG: No results for input(s): GLUCAP in the last 168 hours.  Radiological Exams on Admission: Ct Abdomen Pelvis Wo Contrast  05/20/2014   CLINICAL DATA:  80 year old female with right lower quadrant pain beginning today. Back pain. Initial encounter.  EXAM: CT ABDOMEN AND PELVIS WITHOUT CONTRAST  TECHNIQUE: Multidetector CT imaging of the abdomen and pelvis was performed following the standard protocol without IV contrast.  COMPARISON:  CT Abdomen and Pelvis 03/10/2009.  FINDINGS: Respiratory motion  artifact at the lung bases. Chronic cardiomegaly appears increased. No pericardial or pleural effusion. Mildly increased elevation of the left hemidiaphragm. Peribronchial thickening in the lower lobes greater on the left. No consolidation. Tortuous descending thoracic aorta with calcified plaque.  Osteopenia. Stable visualized spine with widespread disc and endplate degeneration. Chronic L2 superior endplate deformity. Chronic L4-L5 spondylolisthesis. Advanced lumbar posterior element degeneration. Motion artifact at the lumbosacral junction. Sacrum and SI joints appear intact. No acute osseous abnormality identified.  No pelvic free fluid. Gas distending the rectum and distal sigmoid colon. Redundant sigmoid. Diverticulosis in the proximal sigmoid, but no active inflammation. Diverticulosis continuing in the descending colon, no definite inflammation. Oral contrast has reached the splenic flexure. Redundant transverse colon. Diverticula at the  hepatic flexure with no definite inflammation. The right colon is decompressed. Oral contrast in some small bowel loops. Some of these measure at the upper limits of normal, but there is no abrupt small bowel transition. Sliding-type hiatal hernia. Stomach is only mildly distended. Gaseous distension of the duodenum.  Surgically absent gallbladder. Stable non contrast liver, spleen, diminutive pancreas, and adrenal glands.  Severe bilateral hydronephrosis. Severe right perinephric stranding and evidence of right para renal space fluid. There is an obstructing proximal right ureteral calculus measuring 8 x 9 x 12 mm located about 6 cm distal to the right ureteropelvic junction. Distal to that calculus the right ureter is decompressed and difficult to delineate. The severe left hydronephrosis is superimposed on a large chronic left upper pole cyst with thin rim calcification. There is severe left hydroureter extending into the left hemipelvis where a large 12 x 12 x 20 mm obstructing calculus is located about 3 cm proximal to the left ureterovesical junction. No abnormality of the bladder identified.  No intraperitoneal fluid. No pneumoperitoneum. Aortoiliac calcified atherosclerosis noted with tortuous abdominal aorta.  IMPRESSION: 1. Bilateral severe acute obstructive uropathy. Bulky right proximal ureter (12 mm calculus 6 cm distal to the right UPJ) and distal left ureter (20 mm calculus 3 cm proximal to the left UVJ). Severe bilateral hydronephrosis. Severe right perinephric stranding likely in part due to forniceal rupture. 2. Perhaps mild secondary inflammation of the duodenum and some small bowel loops. No bowel obstruction. No other acute process identified in the abdomen or pelvis.   Electronically Signed   By: Augusto Gamble M.D.   On: 05/20/2014 11:03   Dg Chest Port 1 View  05/20/2014   CLINICAL DATA:  Short of breath at rest  EXAM: PORTABLE CHEST - 1 VIEW  COMPARISON:  03/11/2014  FINDINGS: Hypoventilation  with decreased lung volume and bibasilar atelectasis left greater than right. Negative for heart failure or effusion.  IMPRESSION: Hypoventilation with bibasilar atelectasis.   Electronically Signed   By: Marlan Palau M.D.   On: 05/20/2014 13:22    EKG:   Assessment/Plan Principal Problem:   Ureteral stone with hydronephrosis: Valuated by urology in the emergency department. Plan is for stent placement this afternoon. Will admit to medicine floor will monitor intake and output. Continue antibiotics. Supportive therapy, pain management  Active Problems: Acute renal failure; likely related to #1. Will hold any nephrotoxins. Will provide IV fluids. Will recheck in the morning. Monitor urine output   Abdominal pain, generalized: Pain management. Approved at the time of my exam related to #1  UTI with pyuria: Hemodynamically stable. Cipro daily. Await cultures.    Hypertension: Somewhat on the soft side after fentanyl. Presented with a systolic blood pressure of 180 likely related to  pain. Home medications include no antihypertensives. Will monitor    Hyperglycemia: May be related to acute illness. No history of diabetes. Will check hemoglobin A1c.    Leukocytosis: Related to #3. Will monitor.    Hypothyroidism: Chart review indicates TSH taken in February of this year was 28.13. Will continue her Synthroid. Will recheck her TSH    Dr Retta Diones urology    Code Status: DNR DVT Prophylaxis: Family Communication: sons at bedside Disposition Plan: home when ready  Time spent: 65 minutes  [facility] Pager [phone]

## 2014-05-20 NOTE — ED Notes (Signed)
PT to be taken from ED to operating room at 1600 today.

## 2014-05-20 NOTE — ED Notes (Signed)
Pt vomited x1.  

## 2014-05-20 NOTE — ED Notes (Signed)
Hospitalist at bedside at this time 

## 2014-05-20 NOTE — ED Notes (Signed)
Pt c/o severe RLQ pain and tenderness since this morning.  Denies n/v/d.  Reports LBM was yesterday.  Says has some "discomfort" when urinates.

## 2014-05-20 NOTE — Anesthesia Procedure Notes (Signed)
Procedure Name: LMA Insertion Date/Time: 05/20/2014 5:12 PM Performed by: Glynn OctaveANIEL, Dornell Grasmick E Pre-anesthesia Checklist: Patient identified, Patient being monitored, Emergency Drugs available, Timeout performed and Suction available Patient Re-evaluated:Patient Re-evaluated prior to inductionOxygen Delivery Method: Circle System Utilized Preoxygenation: Pre-oxygenation with 100% oxygen Intubation Type: IV induction Ventilation: Mask ventilation without difficulty LMA: LMA inserted LMA Size: 4.0 Number of attempts: 1 Placement Confirmation: positive ETCO2 and breath sounds checked- equal and bilateral

## 2014-05-20 NOTE — ED Notes (Signed)
Patient with c/o sharp RLQ abdominal pain that started today. LBM yesterday and normal per patient. +nausea, no vomiting. Very tender to palpation RLQ.

## 2014-05-20 NOTE — ED Notes (Signed)
MD at bedside. 

## 2014-05-20 NOTE — Transfer of Care (Signed)
Immediate Anesthesia Transfer of Care Note  Patient: [name]  Procedure(s) Performed: Procedure(s): CYSTOSCOPY WITH BILATERAL DOUBLE J STENT PLACEMENT (Bilateral)  Patient Location: PACU  Anesthesia Type:General  Level of Consciousness: awake  Airway & Oxygen Therapy: Patient Spontanous Breathing and Patient connected to face mask oxygen  Post-op Assessment: Report given to PACU RN  Post vital signs: Reviewed and stable  Complications: No apparent anesthesia complications

## 2014-05-20 NOTE — Op Note (Signed)
Preoperative diagnosis: Bilateral obstructing ureteral stones  Postoperative diagnosis: Same   Procedure: Cystoscopy, bilateral double-J stent placement -7 French by 24 cm on right, 7 [place] by 26 cm on left, without tethers   Surgeon: Bertram MillardStephen M. Jamani Bearce, M.D.   Anesthesia: Gen.   Complications: none  Specimen(s): urine for culture  Drain(s): above-mentioned stents  Indications: 80 year old female who presented to the emergency room here early this morning with abdominal pain of short duration. She has a history of kidney stones, none recently. The patient was somewhat lethargic and had abdominal pain. She also had pyuria. CT revealed an 11 mm right proximal ureteral stone with significant hydroureteronephrosis and urinary extravasation at the kidney, as well as a 20 mm left distal ureteral stone with minimal hydronephrosis. With her pyuria and her high-grade obstruction, it was recommended that she undergo bilateral double-J stent placement emergently. She has been given Cipro. She presents for that procedure. I have discussed this with the patient and her family    Technique and findings: the patient was identified in the holding area. She had received preoperative IV antibiotics. She was taken to the operating room. General endotracheal anesthetic was administered. She was placed in the dorsolithotomy position. Genitalia and perineum were prepped and draped. Proper timeout was performed.  A 22 French panendoscope was advanced into her bladder. Significant pyuria was encountered. The bladder was drained and then irrigated copiously. Urine was sent for culture.  Circumferential inspection was then performed with both the 70 and 12 lenses. Ureteral orifices were somewhat hard to find but were normal in configuration and location. There were no urothelial lesions of the bladder. Mild generalized erythema was noted.  I then cannulated the right ureteral orifice with the sensor-tip  guidewire, which under fluoroscopic guidance was advanced proximally. I encountered what I thought was the stone, the guidewire was eventually advanced by that and the crow was seen in the renal pelvis. I advanced the open-ended catheter over top of the guidewire, the guidewire was removed once the tip was in the area of the renal pelvis. Purulent urine came out, verifying adequate position of the catheter. The guidewire was replaced, and the catheter was removed. I then placed a 24 cm x 7 French contour stent. Adequate proximal and distal curls were seen on the stent once a guidewire was removed.  The same procedure was done on the left, with the stone being more distal. Prior to passing the stent, adequate positioning of the guidewire was verified/confirmed with the open-ended catheter. A 26 centimeter by 7 French contour stent was placed, again with the string removed.  At this point, the procedure was terminated. A 16 French catheter was placed in the bladder. This was hooked to dependent drainage. The patient was then awakened and taken to the PACU in stable condition. She tolerated procedure well.

## 2014-05-20 NOTE — ED Notes (Signed)
PT taken to OR at this time.

## 2014-05-20 NOTE — Anesthesia Preprocedure Evaluation (Addendum)
Anesthesia Evaluation  Patient identified by MRN, date of birth, ID band  Reviewed: Allergy & Precautions, H&P , NPO status , Patient's Chart, lab work & pertinent test results  History of Anesthesia Complications Negative for: history of anesthetic complications  Airway Mallampati: III  TM Distance: >3 FB     Dental  (+) Teeth Intact   Pulmonary  breath sounds clear to auscultation        Cardiovascular hypertension, Rhythm:Regular     Neuro/Psych    GI/Hepatic Neg liver ROS,   Endo/Other  Hypothyroidism   Renal/GU      Musculoskeletal   Abdominal (+)  Abdomen: soft.    Peds  Hematology   Anesthesia Other Findings   Reproductive/Obstetrics                           Anesthesia Physical Anesthesia Plan  ASA: III and emergent  Anesthesia Plan: General   Post-op Pain Management:    Induction: Intravenous  Airway Management Planned: LMA  Additional Equipment:   Intra-op Plan:   Post-operative Plan: Extubation in OR  Informed Consent: I have reviewed the patients History and Physical, chart, labs and discussed the procedure including the risks, benefits and alternatives for the proposed anesthesia with the patient or authorized representative who has indicated his/her understanding and acceptance.     Plan Discussed with: Anesthesiologist  Anesthesia Plan Comments:         Anesthesia Quick Evaluation

## 2014-05-21 ENCOUNTER — Encounter (HOSPITAL_COMMUNITY): Payer: Self-pay | Admitting: Urology

## 2014-05-21 LAB — URINE CULTURE

## 2014-05-21 LAB — TSH: TSH: 9.12 u[IU]/mL — ABNORMAL HIGH (ref 0.350–4.500)

## 2014-05-21 NOTE — Care Management Utilization Note (Signed)
UR complete 

## 2014-05-21 NOTE — Progress Notes (Signed)
Pt is not able to do her incentive on her on and she gets very confused

## 2014-05-21 NOTE — Plan of Care (Signed)
Problem: Phase I Progression Outcomes Goal: Pain controlled with appropriate interventions Outcome: Completed/Met Date Met:  05/21/14 Goal: Vital signs/hemodynamically stable Outcome: Completed/Met Date Met:  05/21/14

## 2014-05-21 NOTE — Anesthesia Postprocedure Evaluation (Signed)
  Anesthesia Post-op Note  Patient: [name]  Procedure(s) Performed: Procedure(s): CYSTOSCOPY WITH BILATERAL DOUBLE J STENT PLACEMENT (Bilateral)  Patient Location: room 315  Anesthesia Type:General  Level of Consciousness: awake and patient cooperative  Airway and Oxygen Therapy: Patient Spontanous Breathing  Post-op Pain: none  Post-op Assessment: Post-op Vital signs reviewed, Patient's Cardiovascular Status Stable, Respiratory Function Stable, Patent Airway and No headache  Post-op Vital Signs: Reviewed and stable  Last Vitals:  Filed Vitals:   05/21/14 1327  BP: 118/43  Pulse: 76  Temp: 36.8 C  Resp: 18    Complications: No apparent anesthesia complications

## 2014-05-21 NOTE — Progress Notes (Signed)
Subjective: The patient is alert has minimal pain today. She is somewhat confused. She did have cystoscopy bilateral double-J stent placement yesterday. She does have bilateral obstructing ureteral stone and hydronephrosis. She also has urinary tract infection.  Objective: Vital signs in last 24 hours: Temp:  [97.6 F (36.4 C)-100 F (37.8 C)] 99.6 F (37.6 C) (12/09 0417) Pulse Rate:  [53-109] 82 (12/09 0417) Resp:  [16-30] 16 (12/09 0417) BP: (81-189)/(41-86) 95/60 mmHg (12/09 0417) SpO2:  [92 %-100 %] 95 % (12/09 0417) Weight:  [74.844 kg (165 lb)] 74.844 kg (165 lb) (12/08 1613) Weight change:  Last BM Date: 05/19/14  Intake/Output from previous day: 12/08 0701 - 12/09 0700 In: 1000 [I.V.:1000] Out: 325 [Urine:325] Intake/Output this shift: Total I/O In: -  Out: 325 [Urine:325]  Physical Exam: Gen. appearance-patient is fairly comfortable  HEENT negative  Neck supple no JVD or thyroid abnormalities  Heart regular rhythm no murmurs  Lungs clear to P&A  Abdomen soft no palpable organs or masses  Extremities free of edema   Recent Labs  05/20/14 0738  WBC 12.8*  HGB 11.1*  HCT 34.3*  PLT 328   BMET  Recent Labs  05/20/14 0738  NA 141  K 5.2  CL 108  CO2 24  GLUCOSE 139*  BUN 26*  CREATININE 1.65*  CALCIUM 9.0    Studies/Results: Ct Abdomen Pelvis Wo Contrast  05/20/2014   CLINICAL DATA:  80 year old female with right lower quadrant pain beginning today. Back pain. Initial encounter.  EXAM: CT ABDOMEN AND PELVIS WITHOUT CONTRAST  TECHNIQUE: Multidetector CT imaging of the abdomen and pelvis was performed following the standard protocol without IV contrast.  COMPARISON:  CT Abdomen and Pelvis 03/10/2009.  FINDINGS: Respiratory motion artifact at the lung bases. Chronic cardiomegaly appears increased. No pericardial or pleural effusion. Mildly increased elevation of the left hemidiaphragm. Peribronchial thickening in the lower lobes greater on the  left. No consolidation. Tortuous descending thoracic aorta with calcified plaque.  Osteopenia. Stable visualized spine with widespread disc and endplate degeneration. Chronic L2 superior endplate deformity. Chronic L4-L5 spondylolisthesis. Advanced lumbar posterior element degeneration. Motion artifact at the lumbosacral junction. Sacrum and SI joints appear intact. No acute osseous abnormality identified.  No pelvic free fluid. Gas distending the rectum and distal sigmoid colon. Redundant sigmoid. Diverticulosis in the proximal sigmoid, but no active inflammation. Diverticulosis continuing in the descending colon, no definite inflammation. Oral contrast has reached the splenic flexure. Redundant transverse colon. Diverticula at the hepatic flexure with no definite inflammation. The right colon is decompressed. Oral contrast in some small bowel loops. Some of these measure at the upper limits of normal, but there is no abrupt small bowel transition. Sliding-type hiatal hernia. Stomach is only mildly distended. Gaseous distension of the duodenum.  Surgically absent gallbladder. Stable non contrast liver, spleen, diminutive pancreas, and adrenal glands.  Severe bilateral hydronephrosis. Severe right perinephric stranding and evidence of right para renal space fluid. There is an obstructing proximal right ureteral calculus measuring 8 x 9 x 12 mm located about 6 cm distal to the right ureteropelvic junction. Distal to that calculus the right ureter is decompressed and difficult to delineate. The severe left hydronephrosis is superimposed on a large chronic left upper pole cyst with thin rim calcification. There is severe left hydroureter extending into the left hemipelvis where a large 12 x 12 x 20 mm obstructing calculus is located about 3 cm proximal to the left ureterovesical junction. No abnormality of the bladder identified.  No  intraperitoneal fluid. No pneumoperitoneum. Aortoiliac calcified atherosclerosis noted  with tortuous abdominal aorta.  IMPRESSION: 1. Bilateral severe acute obstructive uropathy. Bulky right proximal ureter (12 mm calculus 6 cm distal to the right UPJ) and distal left ureter (20 mm calculus 3 cm proximal to the left UVJ). Severe bilateral hydronephrosis. Severe right perinephric stranding likely in part due to forniceal rupture. 2. Perhaps mild secondary inflammation of the duodenum and some small bowel loops. No bowel obstruction. No other acute process identified in the abdomen or pelvis.   Electronically Signed   By: Augusto GambleLee  Hall M.D.   On: 05/20/2014 11:03   Dg Chest Port 1 View  05/20/2014   CLINICAL DATA:  Short of breath at rest  EXAM: PORTABLE CHEST - 1 VIEW  COMPARISON:  03/11/2014  FINDINGS: Hypoventilation with decreased lung volume and bibasilar atelectasis left greater than right. Negative for heart failure or effusion.  IMPRESSION: Hypoventilation with bibasilar atelectasis.   Electronically Signed   By: Marlan Palauharles  Clark M.D.   On: 05/20/2014 13:22   Dg C-arm 1-60 Min-no Report  05/20/2014   CLINICAL DATA: stones   C-ARM 1-60 MINUTES  Fluoroscopy was utilized by the requesting physician.  No radiographic  interpretation.     Medications:  . ciprofloxacin  400 mg Intravenous Q24H  . gabapentin  300 mg Oral TID  . levothyroxine  100 mcg Oral QAC breakfast    . sodium chloride       Assessment/Plan: 1. Bilateral ureteral stones with hydronephrosis-stents placed by urology-continue supportive therapy with pain management and antibiotic  2. Urinary tract infection pyuria-will continue Cipro and await cultures  3. Hypothyroidism to continue Synthroid and recheck TSH  4. History of hypertension we'll continue to monitor   LOS: 1 day   Enrrique Mierzwa G 05/21/2014, 6:35 AM

## 2014-05-21 NOTE — Care Management Note (Addendum)
    Page 1 of 1   05/23/2014     1:15:41 PM CARE MANAGEMENT NOTE 05/23/2014  Patient:  [name],[name]   Account Number:  [id number]  Date Initiated:  05/21/2014  Documentation initiated by:  CHILDRESS,JESSICA  Subjective/Objective Assessment:   Pt is from home, lives with son and independent with ADL's prior to admission. Pt has walker at home but no HH services or medication needs prior to admission.     Action/Plan:   [facility], has been notified of referral and will obtain pt's information from chart. Will continue to follow for further CM needs.   Anticipated DC Date:  05/23/2014   Anticipated DC Plan:  HOME W HOME HEALTH SERVICES      DC Planning Services  CM consult      [facility]  [facility]   Choice offered to / List presented to:  C-1 Patient        HH arranged  HH-2 PT      [facility]  [facility]   Status of service:  Completed, signed off Medicare Important Message given?  YES (If response is "NO", the following Medicare IM given date fields will be blank) Date Medicare IM given:  05/23/2014 Medicare IM given by:  Anibal HendersonBOLDEN,GENEVA Date Additional Medicare IM given:   Additional Medicare IM given by:    Discharge Disposition:  HOME W HOME HEALTH SERVICES  Per UR Regulation:    If discussed at Long Length of Stay Meetings, dates discussed:    Comments:  05/23/2014 1300 Kathyr[address] SheriffJessica Childress, RN, MSN, [facility] Plan for pt to go to SNF. CSW aware of needs and will arrange for placement. Pt has no further CM needs.  05/21/2014 1300 Kathyrn SheriffJessica Childress, RN, MSN, [facility]

## 2014-05-21 NOTE — Progress Notes (Signed)
The patient stents had been adequately placed yesterday.  I talked with Dr. Ardyth HarpsHernandez who admitted her.  Currently, the patient's urologic problem is temporarily cured with the stents.  I Will eventually schedule cystoscopy and bilateral ureteral stone management.  Until then, she should have her UTI cleared.  I will have my partner, Dr. Annabell HowellsWrenn see the patient on Friday for follow-up.  She should be followed by the hospitalist, who I spoke with yesterday.

## 2014-05-21 NOTE — Evaluation (Signed)
Physical Therapy Evaluation Patient Details Name: [name] MRN: [id number] DOB: 08/13/1920 Today's Date: 05/21/2014   History of Present Illness  [name] MaeMary E Vaile is a very pleasant 80 y.o. female with a past medical history that includes hypertension, hypothyroidism and kidney stones presents to the emergency department with chief complaint of abdominal pain nausea and vomiting. She'll evaluation in the emergency department reveals bilateral ureteral stones with hydronephrosis and acute renal failure.  Patient lives with her son who is at the bedside and reports she has been in her usual state of health until this morning when she awakened with sudden onset of abdominal pain nausea 2 episodes of vomiting. He denied any blood tinged emesis. He denied any evidence of fever or chills. She complained of sharp persistent abdominal pain located mostly in the lower abdomen radiating around to her side on the right. Her no complaints of chest pain palpitation shortness of breath diaphoresis headache syncope or near-syncope. No history of dysuria hematuria frequency or urgency. The patient is continent of bladder and bowel ambulates with a walker.  Pt underwent cystoscopy on 05/20/14 secondary to (B) obstructing ureteral stones.    Clinical Impression  Pt is a 80 year old female who presents to PT s/p cystoscopy on 05/20/14 secondary to (B) obsructing ureteral stones.  Family present during evaluation, and per family/notes in chart pt having some confusion this morning.  Pt was able to state name, DOB and city/state, though was unable to correctly state month/year.  Also, pt had trouble stating house set up/hx and information received from family.  During evaluation, pt did require some assist with bed mobility skills, and supervision/min guard and use of RW for transfers and gait in room.  Noted drop in O2 sats when seated at EOB and O2 left on during gait assessment; family reports pt does not normally wear O2 at  home.  No complaints of pain in stomach noted during evaluation, though pt did report some pain in the Rt knee with gait which family reports pt has a hx of Rt knee OA.  Recommend continued PT in the hospital to address strengthening, activity tolerance, and balance for improved mobility skills with transition to HHPT for evaluation of safety in home environment and hopefully returning pt to use of std cane for amb in the home.  Recommended to family pt may require some assist at home, with getting in/out of bed or bending over to put of shoes and socks, if tenderness in stomach from surgery was why pt required assist today (though pt did not state pain in stomach during evaluation), and recommendation for use of std walker at return home until activity tolerance improves and pt is able to be progressed back to cane.  No DME recommendations.     Follow Up Recommendations Home health PT    Equipment Recommendations  None recommended by PT       Precautions / Restrictions Precautions Precautions: Fall Restrictions Weight Bearing Restrictions: No      Mobility  Bed Mobility Overal bed mobility: Needs Assistance Bed Mobility: Supine to Sit;Sit to Supine     Supine to sit: Mod assist (for trunk) Sit to supine: Min guard;Min assist (for positioning of LE into bed)      Transfers Overall transfer level: Needs assistance Equipment used: Rolling walker (2 wheeled) Transfers: Sit to/from Stand Sit to Stand: Supervision;Min guard            Ambulation/Gait Ambulation/Gait assistance: Min guard Ambulation Distance (Feet):  20 Feet Assistive device: Rolling walker (2 wheeled) Gait Pattern/deviations: Step-through pattern;Decreased dorsiflexion - right;Decreased dorsiflexion - left   Gait velocity interpretation: Below normal speed for age/gender General Gait Details: Gait distance limited secondary to fatigue and complaints of increasing pain in Rt knee; family reports hx of OA in Rt  knee. O2 kept on during gait, secondary to decreased O2 sats to 89% when seated EOB.       Balance Overall balance assessment: Needs assistance Sitting-balance support: Feet supported;No upper extremity supported Sitting balance-Leahy Scale: Good     Standing balance support: Bilateral upper extremity supported;During functional activity Standing balance-Leahy Scale: Fair                               Pertinent Vitals/Pain Pain Assessment: Faces (Pt unable to give number secondary to confusion (states a lot of pain a couple days ago, though does not state pain today despite questioning throughout evaluation)) Faces Pain Scale: No hurt    Home Living Family/patient expects to be discharged to:: Private residence Living Arrangements: Children Available Help at Discharge: Family;Available 24 hours/day Type of Home: House Home Access: Stairs to enter;Ramped entrance   Entrance Stairs-Number of Steps: ramp entry at front door with (B) rails, 2 steps at side door Home Layout: One level Home Equipment: Wheelchair - manual;Walker - standard;Cane - single point;Shower seat;Grab bars - tub/shower Additional Comments: Youth workerTub Shower    Prior Function Level of Independence: Independent with assistive device(s)         Comments: Pt no longer drives, son drives her to stores/MD appointments.         Extremity/Trunk Assessment               Lower Extremity Assessment: Generalized weakness         Communication   Communication: No difficulties  Cognition Arousal/Alertness: Awake/alert Behavior During Therapy: WFL for tasks assessed/performed Overall Cognitive Status: Impaired/Different from baseline (Pt able to state name, DOB, and city/state, though unable to give correct year)                       Assessment/Plan    PT Assessment Patient needs continued PT services  PT Diagnosis Difficulty walking;Generalized weakness   PT Problem List Decreased  strength;Decreased activity tolerance;Decreased balance;Decreased mobility  PT Treatment Interventions DME instruction;Balance training;Gait training;Functional mobility training;Therapeutic activities;Therapeutic exercise   PT Goals (Current goals can be found in the Care Plan section) Acute Rehab PT Goals Patient Stated Goal: go home PT Goal Formulation: With family Time For Goal Achievement: 06/04/14 Potential to Achieve Goals: Good    Frequency Min 3X/week    End of Session Equipment Utilized During Treatment: Gait belt;Oxygen Activity Tolerance: Patient limited by fatigue Patient left: in bed;with call bell/phone within reach;with bed alarm set;with family/visitor present Nurse Communication: Mobility status (to Student RN)         Time: 1610-9604: 1017-1041 PT Time Calculation (min) (ACUTE ONLY): 24 min   Charges:   PT Evaluation $Initial PT Evaluation Tier I: 1 Procedure PT Treatments $Self Care/Home Management: 8-22 (with family on assist required with functional mobility and D/C recommendation for use of walker and HHPT at home for safety)   Freddi Schrager 05/21/2014, 11:09 AM

## 2014-05-22 MED ORDER — IPRATROPIUM-ALBUTEROL 0.5-2.5 (3) MG/3ML IN SOLN
3.0000 mL | Freq: Four times a day (QID) | RESPIRATORY_TRACT | Status: DC | PRN
  Administered 2014-05-22 – 2014-05-23 (×3): 3 mL via RESPIRATORY_TRACT
  Filled 2014-05-22 (×2): qty 3

## 2014-05-22 MED ORDER — CIPROFLOXACIN HCL 250 MG PO TABS
500.0000 mg | ORAL_TABLET | Freq: Every day | ORAL | Status: DC
  Administered 2014-05-22 – 2014-05-24 (×3): 500 mg via ORAL
  Filled 2014-05-22 (×3): qty 2

## 2014-05-22 MED ORDER — LEVOTHYROXINE SODIUM 75 MCG PO TABS
150.0000 ug | ORAL_TABLET | Freq: Every day | ORAL | Status: DC
  Administered 2014-05-22 – 2014-05-24 (×3): 150 ug via ORAL
  Filled 2014-05-22 (×3): qty 2

## 2014-05-22 NOTE — Progress Notes (Signed)
Subjective: The patient remains alert with minimal pain. She does have bilateral obstructing ureteral stone and hydronephrosis. She did have cystoscopy with double-J stent placement by urology. She is voiding adequately  Objective: Vital signs in last 24 hours: Temp:  [98.3 F (36.8 C)-98.4 F (36.9 C)] 98.4 F (36.9 C) (12/10 0530) Pulse Rate:  [66-76] 73 (12/10 0530) Resp:  [18-20] 20 (12/10 0530) BP: (95-118)/(43-70) 112/57 mmHg (12/10 0530) SpO2:  [86 %-100 %] 100 % (12/10 0530) Weight change:  Last BM Date: 05/19/14  Intake/Output from previous day: 12/09 0701 - 12/10 0700 In: 1529.2 [P.O.:600; I.V.:729.2; IV Piggyback:200] Out: 800 [Urine:800] Intake/Output this shift: Total I/O In: -  Out: 400 [Urine:400]  Physical Exam: Gen. appearance-patient is fairly comfortable  HEENT negative  Neck supple no JVD or thyroid abnormalities  Heart regular rhythm no murmurs  Lungs clear to P&A  Abdomen soft no palpable organs or masses  Extremities free of edema   Recent Labs  05/20/14 0738  WBC 12.8*  HGB 11.1*  HCT 34.3*  PLT 328   BMET  Recent Labs  05/20/14 0738  NA 141  K 5.2  CL 108  CO2 24  GLUCOSE 139*  BUN 26*  CREATININE 1.65*  CALCIUM 9.0    Studies/Results: Ct Abdomen Pelvis Wo Contrast  05/20/2014   CLINICAL DATA:  80 year old female with right lower quadrant pain beginning today. Back pain. Initial encounter.  EXAM: CT ABDOMEN AND PELVIS WITHOUT CONTRAST  TECHNIQUE: Multidetector CT imaging of the abdomen and pelvis was performed following the standard protocol without IV contrast.  COMPARISON:  CT Abdomen and Pelvis 03/10/2009.  FINDINGS: Respiratory motion artifact at the lung bases. Chronic cardiomegaly appears increased. No pericardial or pleural effusion. Mildly increased elevation of the left hemidiaphragm. Peribronchial thickening in the lower lobes greater on the left. No consolidation. Tortuous descending thoracic aorta with calcified  plaque.  Osteopenia. Stable visualized spine with widespread disc and endplate degeneration. Chronic L2 superior endplate deformity. Chronic L4-L5 spondylolisthesis. Advanced lumbar posterior element degeneration. Motion artifact at the lumbosacral junction. Sacrum and SI joints appear intact. No acute osseous abnormality identified.  No pelvic free fluid. Gas distending the rectum and distal sigmoid colon. Redundant sigmoid. Diverticulosis in the proximal sigmoid, but no active inflammation. Diverticulosis continuing in the descending colon, no definite inflammation. Oral contrast has reached the splenic flexure. Redundant transverse colon. Diverticula at the hepatic flexure with no definite inflammation. The right colon is decompressed. Oral contrast in some small bowel loops. Some of these measure at the upper limits of normal, but there is no abrupt small bowel transition. Sliding-type hiatal hernia. Stomach is only mildly distended. Gaseous distension of the duodenum.  Surgically absent gallbladder. Stable non contrast liver, spleen, diminutive pancreas, and adrenal glands.  Severe bilateral hydronephrosis. Severe right perinephric stranding and evidence of right para renal space fluid. There is an obstructing proximal right ureteral calculus measuring 8 x 9 x 12 mm located about 6 cm distal to the right ureteropelvic junction. Distal to that calculus the right ureter is decompressed and difficult to delineate. The severe left hydronephrosis is superimposed on a large chronic left upper pole cyst with thin rim calcification. There is severe left hydroureter extending into the left hemipelvis where a large 12 x 12 x 20 mm obstructing calculus is located about 3 cm proximal to the left ureterovesical junction. No abnormality of the bladder identified.  No intraperitoneal fluid. No pneumoperitoneum. Aortoiliac calcified atherosclerosis noted with tortuous abdominal aorta.  IMPRESSION: 1.  Bilateral severe acute  obstructive uropathy. Bulky right proximal ureter (12 mm calculus 6 cm distal to the right UPJ) and distal left ureter (20 mm calculus 3 cm proximal to the left UVJ). Severe bilateral hydronephrosis. Severe right perinephric stranding likely in part due to forniceal rupture. 2. Perhaps mild secondary inflammation of the duodenum and some small bowel loops. No bowel obstruction. No other acute process identified in the abdomen or pelvis.   Electronically Signed   By: Augusto GambleLee  Hall M.D.   On: 05/20/2014 11:03   Dg Chest Port 1 View  05/20/2014   CLINICAL DATA:  Short of breath at rest  EXAM: PORTABLE CHEST - 1 VIEW  COMPARISON:  03/11/2014  FINDINGS: Hypoventilation with decreased lung volume and bibasilar atelectasis left greater than right. Negative for heart failure or effusion.  IMPRESSION: Hypoventilation with bibasilar atelectasis.   Electronically Signed   By: Marlan Palauharles  Clark M.D.   On: 05/20/2014 13:22   Dg C-arm 1-60 Min-no Report  05/20/2014   CLINICAL DATA: stones   C-ARM 1-60 MINUTES  Fluoroscopy was utilized by the requesting physician.  No radiographic  interpretation.     Medications:  . ciprofloxacin  400 mg Intravenous Q24H  . gabapentin  300 mg Oral TID  . levothyroxine  100 mcg Oral QAC breakfast    . sodium chloride 125 mL/hr at 05/22/14 0547     Assessment/Plan: 1. Bilateral ureteral stones with hydronephrosis-stents placed by urology-continue supportive care with pain management and current antibiotics  2. Urinary tract infection pyuria-we'll continue Cipro and await culture reports which are not available  3. Hypothyroidism to continue current Synthroid dosage  4. Hyperthyroidism essential continue to monitor   LOS: 2 days   Denilson Salminen G 05/22/2014, 6:02 AM

## 2014-05-22 NOTE — Progress Notes (Signed)
PHARMACIST - PHYSICIAN COMMUNICATION DR:   Renard MatterMcInnis CONCERNING: Antibiotic IV to Oral Route Change Policy  RECOMMENDATION: This patient is receiving Cipro by the intravenous route.  Based on criteria approved by the Pharmacy and Therapeutics Committee, the antibiotic(s) is/are being converted to the equivalent oral dose form(s).   DESCRIPTION: These criteria include:  Patient being treated for a respiratory tract infection, urinary tract infection, cellulitis or clostridium difficile associated diarrhea if on metronidazole  The patient is not neutropenic and does not exhibit a GI malabsorption state  The patient is eating (either orally or via tube) and/or has been taking other orally administered medications for a least 24 hours  The patient is improving clinically and has a Tmax < 100.5  If you have questions about this conversion, please contact the Pharmacy Department  [x]   [phone]( [phone] )  [facility] []   [phone]( [phone] )  [facility]  []   [phone]( [phone] )  [facility] []   [phone]( [phone] )  [facility]   Junita PushMichelle Luann Aspinwall, PharmD, BCPS 05/22/2014@11 :17 AM

## 2014-05-22 NOTE — Progress Notes (Signed)
Physical Therapy Treatment Patient Details Name: [name] MRN: [id number] DOB: 06/16/1920 Today's Date: 05/22/2014    History of Present Illness [name] is a very pleasant 80 y.o. female with a past medical history that includes hypertension, hypothyroidism and kidney stones presents to the emergency department with chief complaint of abdominal pain nausea and vomiting. She'll evaluation in the emergency department reveals bilateral ureteral stones with hydronephrosis and acute renal failure.  Patient lives with her son who is at the bedside and reports she has been in her usual state of health until this morning when she awakened with sudden onset of abdominal pain nausea 2 episodes of vomiting. He denied any blood tinged emesis. He denied any evidence of fever or chills. She complained of sharp persistent abdominal pain located mostly in the lower abdomen radiating around to her side on the right. Her no complaints of chest pain palpitation shortness of breath diaphoresis headache syncope or near-syncope. No history of dysuria hematuria frequency or urgency. The patient is continent of bladder and bowel ambulates with a walker.  Pt underwent cystoscopy on 05/20/14 secondary to (B) obstructing ureteral stones.      PT Comments    Pt was more disoriented today but very pleasant.  She was able to follow most directions but did not know where she was or have any understanding of her situation.  She was on 2 L O2 at the time of my visit, O2 sat=99%.  O2 was removed and she was able to maintain O2 sat of 96-98% throughout my visit.  She demonstrated increased weakness with therapeutic exercise and transfers, in addition to being unable to ambulate.  She did describe dizziness with sitting and standing.  I did not get orthostatics, but BP seated was 126/75 with c/o dizziness.  Her O2 sat remained at 98%.  She does also c/o and ear  Ache and son reports that she has a bad tooth which is on the same side  as the earache.  (I think this is the right ear).  If pt status does not improve, we will have to consider short term SNF which I discussed with both of her sons.  They state that they will do whatever is recommended.  Follow Up Recommendations  Home health PT;SNF (per pt functional status)     Equipment Recommendations  None recommended by PT    Recommendations for Other Services  none     Precautions / Restrictions Precautions Precautions: Fall Restrictions Weight Bearing Restrictions: No    Mobility  Bed Mobility Overal bed mobility: Needs Assistance Bed Mobility: Supine to Sit     Supine to sit: Max assist;HOB elevated     General bed mobility comments: pt needed max assist to assist trunk to sitting, min assist for LEs  Transfers Overall transfer level: Needs assistance Equipment used: Standard walker Transfers: Sit to/from Stand;Stand Pivot Transfers Sit to Stand: Max assist Stand pivot transfers: Mod assist (using a walker, assist to manage walker and verbal cues for sequencing)       General transfer comment: pt had significant difficulty standing from sitting...1st attempt she fell backward onto the bed, 2nd attempt she could not reach stance, 3rd attempt she was able to maintain stance with a walker  Ambulation/Gait Ambulation/Gait assistance:  (unable to ambulate today)               Stairs            Wheelchair Mobility    Modified  Rankin (Stroke Patients Only)       Balance Overall balance assessment: Needs assistance Sitting-balance support: No upper extremity supported;Feet supported Sitting balance-Leahy Scale: Fair     Standing balance support: Bilateral upper extremity supported Standing balance-Leahy Scale: Fair                      Cognition Arousal/Alertness: Awake/alert Behavior During Therapy: WFL for tasks assessed/performed Overall Cognitive Status: Impaired/Different from baseline Area of Impairment:  Orientation;Memory Orientation Level: Disoriented to;Place;Time;Situation             General Comments: more disoriented than yesterday    Exercises General Exercises - Upper Extremity Shoulder Flexion: AAROM;Both;5 reps;Supine General Exercises - Lower Extremity Ankle Circles/Pumps: AAROM;AROM;Both;10 reps;Supine Short Arc Quad: AAROM;Both;10 reps;Supine Heel Slides: AAROM;Both;10 reps;Supine Hip ABduction/ADduction: AAROM;Both;10 reps;Supine    General Comments        Pertinent Vitals/Pain Pain Assessment: No/denies pain    Home Living                      Prior Function   min assist with ADLs, able to cook meals last week         PT Goals (current goals can now be found in the care plan section) Progress towards PT goals: Not progressing toward goals - comment (more disoriented, weaker...etiology unknown)    Frequency  Min 3X/week    PT Plan Discharge plan needs to be updated (pt may need transfer to SNF if she does not improve)    Co-evaluation             End of Session Equipment Utilized During Treatment: Gait belt (O2 removed...O2 sat on room air=98%) Activity Tolerance: Patient limited by fatigue Patient left: in chair;with call bell/phone within reach;with family/visitor present     Time: [id number] PT Time Calculation (min) (ACUTE ONLY): 41 min  Charges:  $Therapeutic Exercise: 8-22 mins $Therapeutic Activity: 8-22 mins                    G Codes:      Myrlene BrokerBrown, Marleah Beever L 05/22/2014, 1:30 PM

## 2014-05-23 LAB — BASIC METABOLIC PANEL
Anion gap: 10 (ref 5–15)
BUN: 40 mg/dL — AB (ref 6–23)
CHLORIDE: 105 meq/L (ref 96–112)
CO2: 20 meq/L (ref 19–32)
CREATININE: 2.03 mg/dL — AB (ref 0.50–1.10)
Calcium: 8.4 mg/dL (ref 8.4–10.5)
GFR calc non Af Amer: 20 mL/min — ABNORMAL LOW (ref >90)
GFR, EST AFRICAN AMERICAN: 24 mL/min — AB (ref >90)
GLUCOSE: 89 mg/dL (ref 70–99)
Potassium: 5.5 mEq/L — ABNORMAL HIGH (ref 3.7–5.3)
Sodium: 135 mEq/L — ABNORMAL LOW (ref 137–147)

## 2014-05-23 NOTE — Progress Notes (Signed)
Physical Therapy Treatment Patient Details Name: [name] MRN: [id number] DOB: [birth year] Today's Date: 05/23/2014    History of Present Illness [name] is a very pleasant 80 y.o. female with a past medical history that includes hypertension, hypothyroidism and kidney stones presents to the emergency department with chief complaint of abdominal pain nausea and vomiting. She'll evaluation in the emergency department reveals bilateral ureteral stones with hydronephrosis and acute renal failure.  Patient lives with her son who is at the bedside and reports she has been in her usual state of health until this morning when she awakened with sudden onset of abdominal pain nausea 2 episodes of vomiting. He denied any blood tinged emesis. He denied any evidence of fever or chills. She complained of sharp persistent abdominal pain located mostly in the lower abdomen radiating around to her side on the right. Her no complaints of chest pain palpitation shortness of breath diaphoresis headache syncope or near-syncope. No history of dysuria hematuria frequency or urgency. The patient is continent of bladder and bowel ambulates with a walker.  Pt underwent cystoscopy on 05/20/14 secondary to (B) obstructing ureteral stones.      PT Comments    Pt is closer to baseline cognitive function.  She continues to be disoriented but is less easily distracted and more able to follow directions.  She was able to participate in therapeutic exercise supine and needed less assistance with transfer supine to sit.  Unfortunately, her ability to stand is still poor and she needed mod assist to transfer to [facility] and to stand with a walker and walk about 3' to a chair.  She was unable to attain an erect stance and fully flexes her trunk over the walker.  She did c/o severe pain in her right knee in standing which totally resolves in the seated position.  Her right knee ROM is WNL and there was no palpable warmth or visible  erythema at the joint.  She appears to have OA in that knee.  Because her functional ability is still very poor, I am recommending SNF at d/c and have discussed this with pt's son.  Follow Up Recommendations  SNF     Equipment Recommendations  None recommended by PT    Recommendations for Other Services  none     Precautions / Restrictions Precautions Precautions: Fall Restrictions Weight Bearing Restrictions: No    Mobility  Bed Mobility Overal bed mobility: Needs Assistance Bed Mobility: Supine to Sit     Supine to sit: Min guard;HOB elevated     General bed mobility comments: pt much more mobile in the bed today and able to move her trunk and legs without assist...close guarding to ensure that she didn't move too far off the edge of bed  Transfers Overall transfer level: Needs assistance Equipment used: Standard walker Transfers: Sit to/from Stand;Stand Pivot Transfers Sit to Stand: Mod assist Stand pivot transfers: Mod assist       General transfer comment: sit to stand transfer is still labored but less so than yesterday...instruction given for proper hand placement especially during stand pivot transfer to [facility]  Ambulation/Gait Ambulation/Gait assistance: Max assist Ambulation Distance (Feet): 3 Feet Assistive device: Standard walker Gait Pattern/deviations: Trunk flexed;Decreased step length - right;Decreased step length - left   Gait velocity interpretation: Below normal speed for age/gender General Gait Details: trunk fully flexed over walker and unable to stand erect...max assist to maintain stance with walker and give verbal cues for sequencing   Stairs  Wheelchair Mobility    Modified Rankin (Stroke Patients Only)       Balance Overall balance assessment: Needs assistance Sitting-balance support: No upper extremity supported;Feet supported Sitting balance-Leahy Scale: Good       Standing balance-Leahy Scale: Poor Standing  balance comment: standing balance is poor due to inability to stand erect...                    Cognition Arousal/Alertness: Awake/alert Behavior During Therapy: WFL for tasks assessed/performed Overall Cognitive Status: History of cognitive impairments - at baseline                      Exercises General Exercises - Lower Extremity Ankle Circles/Pumps: AROM;Both;10 reps;Supine Short Arc Quad: AROM;Both;10 reps;Supine Heel Slides: AAROM;Both;10 reps;Supine Hip ABduction/ADduction: AAROM;Both;10 reps;Supine    General Comments        Pertinent Vitals/Pain Pain Assessment: No/denies pain (does have right knee pain with weight bearing)    Home Living                      Prior Function   independent         PT Goals (current goals can now be found in the care plan section) Progress towards PT goals: Progressing toward goals    Frequency  Min 3X/week    PT Plan Discharge plan needs to be updated    Co-evaluation             End of Session Equipment Utilized During Treatment: Gait belt Activity Tolerance: Patient limited by fatigue Patient left: in chair;with call bell/phone within reach;with family/visitor present (family to stay with pt while she is up in chair)     Time: [id number] PT Time Calculation (min) (ACUTE ONLY): 45 min  Charges:  $Therapeutic Exercise: 8-22 mins $Therapeutic Activity: 8-22 mins                    G Codes:      Konrad PentaBrown, Trygg Mantz L 05/23/2014, 12:31 PM

## 2014-05-23 NOTE — Clinical Social Work Placement (Signed)
Clinical Social Work Department CLINICAL SOCIAL WORK PLACEMENT NOTE 05/23/2014  Patient:  [name],[name]  Account Number:  [id number] Admit date:  05/20/2014  Clinical Social Worker:  Derenda FennelKARA Ardean Melroy, LCSW  Date/time:  05/23/2014 01:57 PM  Clinical Social Work is seeking post-discharge placement for this patient at the following level of care:   SKILLED NURSING   (*CSW will update this form in Epic as items are completed)   05/23/2014  Patient/family provided with [facility] Department of Clinical Social Work's list of facilities offering this level of care within the geographic area requested by the patient (or if unable, by the patient's family).  05/23/2014  Patient/family informed of their freedom to choose among providers that offer the needed level of care, that participate in Medicare, Medicaid or managed care program needed by the patient, have an available bed and are willing to accept the patient.  05/23/2014  Patient/family informed of [facility]' ownership interest in [facility], as well as of the fact that they are under no obligation to receive care at this facility.  PASARR submitted to EDS on 05/23/2014 PASARR number received on 05/23/2014  FL2 transmitted to all facilities in geographic area requested by pt/family on  05/23/2014 FL2 transmitted to all facilities within larger geographic area on   Patient informed that his/her managed care company has contracts with or will negotiate with  certain facilities, including the following:     Patient/family informed of bed offers received:   Patient chooses bed at  Physician recommends and patient chooses bed at    Patient to be transferred to  on   Patient to be transferred to facility by  Patient and family notified of transfer on  Name of family member notified:    The following physician request were entered in Epic:   Additional Comments:  Derenda FennelKara Sarrinah Gardin, LCSW [phone]

## 2014-05-23 NOTE — Clinical Social Work Note (Signed)
[name]/HCPOA [facility]Bobby [facility] Facility notified and well as MD. Anticipate d/c Saturday per MD. Facility aware.   Derenda FennelKara Asherah Lavoy, [place]CS[place] [phone]

## 2014-05-23 NOTE — Clinical Social Work Placement (Signed)
Clinical Social Work Department CLINICAL SOCIAL WORK PLACEMENT NOTE 05/23/2014  Patient:  [name],[name]  Account Number:  [id number] Admit date:  05/20/2014  Clinical Social Worker:  Derenda FennelKARA Breindy Meadow, LCSW  Date/time:  05/23/2014 01:57 PM  Clinical Social Work is seeking post-discharge placement for this patient at the following level of care:   SKILLED NURSING   (*CSW will update this form in Epic as items are completed)   05/23/2014  Patient/family provided with [facility] Department of Clinical Social Work's list of facilities offering this level of care within the geographic area requested by the patient (or if unable, by the patient's family).  05/23/2014  Patient/family informed of their freedom to choose among providers that offer the needed level of care, that participate in Medicare, Medicaid or managed care program needed by the patient, have an available bed and are willing to accept the patient.  05/23/2014  Patient/family informed of [facility] ownership interest in [facility], as well as of the fact that they are under no obligation to receive care at this facility.  PASARR submitted to EDS on 05/23/2014 PASARR number received on 05/23/2014  FL2 transmitted to all facilities in geographic area requested by pt/family on  05/23/2014 FL2 transmitted to all facilities within larger geographic area on   Patient informed that his/her managed care company has contracts with or will negotiate with  certain facilities, including the following:     Patient/family informed of bed offers received:  05/23/2014 [facility] Physician [facility] and patient [facility]  Patient to be transferred to  on   Patient to be transferred to facility by  Patient and family notified of transfer on  Name of family member notified:    The following physician request were entered in Epic:   Additional Comments:  Derenda FennelKara Vianca Bracher,  LCSW [phone]

## 2014-05-23 NOTE — Progress Notes (Signed)
Subjective: The patient is at times mentally confused. She has minimal pain. She does have bilateral obstructing ureteral stone and hydronephrosis. She did have cystoscopy with double-J stent placed by urology. She did have nebulizer treatment last evening because of breathing difficulty but this subsided  Objective: Vital signs in last 24 hours: Temp:  [97.4 F (36.3 C)-99 F (37.2 C)] 97.4 F (36.3 C) (12/10 2154) Pulse Rate:  [71-85] 74 (12/10 2154) Resp:  [20-22] 20 (12/10 2154) BP: (108-117)/(44-67) 108/44 mmHg (12/10 2154) SpO2:  [95 %-99 %] 99 % (12/10 2154) Weight change:  Last BM Date: 05/21/14  Intake/Output from previous day: 12/10 0701 - 12/11 0700 In: 1080 [P.O.:1080] Out: 1300 [Urine:1300] Intake/Output this shift:    Physical Exam: Gen. appearance-patient is fairly comfortable  HEENT negative  Neck supple no JVD or thyroid abnormalities  Heart regular rhythm no murmurs  Lungs clear to P&A  Abdomen soft no palpable organs or masses  Extremities free of edema   Recent Labs  05/20/14 0738  WBC 12.8*  HGB 11.1*  HCT 34.3*  PLT 328   BMET  Recent Labs  05/20/14 0738  NA 141  K 5.2  CL 108  CO2 24  GLUCOSE 139*  BUN 26*  CREATININE 1.65*  CALCIUM 9.0    Studies/Results: No results found.  Medications:  . ciprofloxacin  500 mg Oral Q breakfast  . gabapentin  300 mg Oral TID  . levothyroxine  150 mcg Oral QAC breakfast    . sodium chloride 125 mL/hr at 05/22/14 1509     Assessment/Plan: 1. Bilateral ureteral stones with hydronephrosis-stents placed by urology-continue supportive care and pain management and current antibiotics  2. Urinary tract infection-we will continue Cipro  3. Hypothyroidism levothyroxine dose is changed to 150 MCG daily  4.-Hyperthyroidism essential continue to monitor continue medications   LOS: 3 days   Farha Dano G 05/23/2014, 6:06 AM

## 2014-05-23 NOTE — Clinical Social Work Psychosocial (Signed)
Clinical Social Work Department BRIEF PSYCHOSOCIAL ASSESSMENT 05/23/2014  Patient:  [name], [name]     Account Number:  [id number]     Admit date:  05/20/2014  Clinical Social Worker:  Wyatt Haste  Date/Time:  05/23/2014 01:58 PM  Referred by:  CSW  Date Referred:  05/23/2014 Referred for  SNF Placement   Other Referral:   Interview type:  Patient Other interview type:   son- Antony Haste    PSYCHOSOCIAL DATA Living Status:  FAMILY Admitted from facility:   Level of care:   Primary support name:  Alan/Bobby Primary support relationship to patient:  CHILD, ADULT Degree of support available:   supportive    CURRENT CONCERNS Current Concerns  Post-Acute Placement   Other Concerns:    SOCIAL WORK ASSESSMENT / PLAN CSW met with pt and pt's son, Antony Haste at bedside. Pt alert, but pleasantly confused. Antony Haste reports that confusion has been worse in hospital. Pt lives with her son, Mortimer Fries who is also HCPOA. Antony Haste states that Mortimer Fries is tied up with a family member's surgery today. Antony Haste also lives nearby. Family appears to be involved and supportive. Mortimer Fries is with pt around the clock at home, but also helps take care of his sister. At baseline, pt typically does very well at home. She has a walker, but generally furniture walks. PT evaluated pt several days ago and recommendation was for home health. She was assessed again yesterday and today and found to be much weaker. PT now recommending SNF. CSW discussed placement process and Medicare coverage/criteria. Alan requests [facility] if possible and said if this is needed then they are agreeable. CSW will attempt to call HCPOA, Mortimer Fries as well.   Assessment/plan status:  Psychosocial Support/Ongoing Assessment of Needs Other assessment/ plan:   Information/referral to community resources:   SNF list    PATIENT'S/FAMILY'S RESPONSE TO PLAN OF CARE: Family report positive feelings regarding short term SNF prior to return home. CSW will follow up with  bed offers.       Benay Pike, [place]

## 2014-05-24 ENCOUNTER — Ambulatory Visit (HOSPITAL_COMMUNITY)
Admission: RE | Admit: 2014-05-24 | Discharge: 2014-05-24 | Disposition: A | Discharge status: Home / Self Care | Payer: Medicare Other | Source: Ambulatory Visit | Attending: Family Medicine | Admitting: Family Medicine

## 2014-05-24 ENCOUNTER — Other Ambulatory Visit (HOSPITAL_COMMUNITY): Payer: Self-pay | Admitting: Family Medicine

## 2014-05-24 ENCOUNTER — Inpatient Hospital Stay
Admission: RE | Admit: 2014-05-24 | Discharge: 2014-06-23 | Disposition: A | Discharge status: Other Acute Inpatient Hospital | Payer: PRIVATE HEALTH INSURANCE | Source: Ambulatory Visit | Attending: Family Medicine | Admitting: Family Medicine

## 2014-05-24 DIAGNOSIS — R7981 Abnormal blood-gas level: Secondary | ICD-10-CM

## 2014-05-24 DIAGNOSIS — I517 Cardiomegaly: Secondary | ICD-10-CM | POA: Insufficient documentation

## 2014-05-24 DIAGNOSIS — R062 Wheezing: Secondary | ICD-10-CM

## 2014-05-24 DIAGNOSIS — R0902 Hypoxemia: Secondary | ICD-10-CM | POA: Diagnosis not present

## 2014-05-24 MED ORDER — CIPROFLOXACIN HCL 500 MG PO TABS
500.0000 mg | ORAL_TABLET | Freq: Two times a day (BID) | ORAL | Status: DC
Start: 2014-05-24 — End: 2014-06-27

## 2014-05-24 NOTE — Discharge Summary (Signed)
Physician Discharge Summary  [name] ZOX:[id number] DOB: [birth year] DOA: 05/20/2014  PCP: [name], MD  Admit date: 05/20/2014 Discharge date: 05/24/2014     Discharge Diagnoses:  1. Bilateral ureteral stones with hydronephrosis 2. Renal failure stage II 3. Urinary tract infection no organism cultured 4. Hypertension essential 5. Hypothyroidism 6. Leukocytosis 7.  8.  9.  10.  11.   Discharge Condition: Stable Disposition: Patient discharged to nursing facility  Diet recommendation: Low-sodium renal diet  Filed Weights   05/20/14 1613  Weight: 74.844 kg (165 lb)    History of present illness:  The patient was admitted through the emergency department with lower abdominal pain and nausea. The patient had CT of the abdomen done shortly after admission which revealed obstructive uropathy 12 mm calculus right UP junction with hydronephrosis also noted 20 mm calculus proximal left UVJ. Patient was started on intravenous fluids cultures were obtained and patient started on IV antibiotics. Urology was contacted Hospital Course:  The patient was taken [name] to surgery where she had undergone general anesthesia cystoscopy with double-J stent placement by urology. She tolerated procedure in satisfactory manner and was subsequently transferred to MedSurg floor. She continued to improve throughout her hospital stay. She remained on Cipro 500 mg twice a day. Cultures did not identify organism. She was seen subsequently by urology. It was felt that she needed placement in skilled nursing facility. I'm needs outpatient follow-up with urology this will be scheduled. She was continued on medications listed below   Discharge Instructions The patient is to continue home medications listed below. She is discharged to skilled nursing facility. Appointment should be made with urology next week.    Medication List    TAKE these medications        ciprofloxacin 500 MG tablet  Commonly  known as:  CIPRO  Take 1 tablet (500 mg total) by mouth 2 (two) times daily.     gabapentin 300 MG capsule  Commonly known as:  NEURONTIN  Take 300 mg by mouth 3 (three) times daily.     levothyroxine 100 MCG tablet  Commonly known as:  SYNTHROID, LEVOTHROID  Take 100 mcg by mouth daily before breakfast.     PAIN RELIEVER 325 MG tablet  Generic drug:  acetaminophen  Take 650 mg by mouth every 6 (six) hours as needed for mild pain or moderate pain.       Allergies  Allergen Reactions  . Lactulose     REACTION: unknown reaction  . Penicillins     REACTION: unknown reaction  . Prilosec [Omeprazole] Itching and Rash    The results of significant diagnostics from this hospitalization (including imaging, microbiology, ancillary and laboratory) are listed below for reference.    Significant Diagnostic Studies: Ct Abdomen Pelvis Wo Contrast  05/20/2014   CLINICAL DATA:  80 year old female with right lower quadrant pain beginning today. Back pain. Initial encounter.  EXAM: CT ABDOMEN AND PELVIS WITHOUT CONTRAST  TECHNIQUE: Multidetector CT imaging of the abdomen and pelvis was performed following the standard protocol without IV contrast.  COMPARISON:  CT Abdomen and Pelvis 03/10/2009.  FINDINGS: Respiratory motion artifact at the lung bases. Chronic cardiomegaly appears increased. No pericardial or pleural effusion. Mildly increased elevation of the left hemidiaphragm. Peribronchial thickening in the lower lobes greater on the left. No consolidation. Tortuous descending thoracic aorta with calcified plaque.  Osteopenia. Stable visualized spine with widespread disc and endplate degeneration. Chronic L2 superior endplate deformity. Chronic L4-L5 spondylolisthesis. Advanced lumbar posterior element  degeneration. Motion artifact at the lumbosacral junction. Sacrum and SI joints appear intact. No acute osseous abnormality identified.  No pelvic free fluid. Gas distending the rectum and distal  sigmoid colon. Redundant sigmoid. Diverticulosis in the proximal sigmoid, but no active inflammation. Diverticulosis continuing in the descending colon, no definite inflammation. Oral contrast has reached the splenic flexure. Redundant transverse colon. Diverticula at the hepatic flexure with no definite inflammation. The right colon is decompressed. Oral contrast in some small bowel loops. Some of these measure at the upper limits of normal, but there is no abrupt small bowel transition. Sliding-type hiatal hernia. Stomach is only mildly distended. Gaseous distension of the duodenum.  Surgically absent gallbladder. Stable non contrast liver, spleen, diminutive pancreas, and adrenal glands.  Severe bilateral hydronephrosis. Severe right perinephric stranding and evidence of right para renal space fluid. There is an obstructing proximal right ureteral calculus measuring 8 x 9 x 12 mm located about 6 cm distal to the right ureteropelvic junction. Distal to that calculus the right ureter is decompressed and difficult to delineate. The severe left hydronephrosis is superimposed on a large chronic left upper pole cyst with thin rim calcification. There is severe left hydroureter extending into the left hemipelvis where a large 12 x 12 x 20 mm obstructing calculus is located about 3 cm proximal to the left ureterovesical junction. No abnormality of the bladder identified.  No intraperitoneal fluid. No pneumoperitoneum. Aortoiliac calcified atherosclerosis noted with tortuous abdominal aorta.  IMPRESSION: 1. Bilateral severe acute obstructive uropathy. Bulky right proximal ureter (12 mm calculus 6 cm distal to the right UPJ) and distal left ureter (20 mm calculus 3 cm proximal to the left UVJ). Severe bilateral hydronephrosis. Severe right perinephric stranding likely in part due to forniceal rupture. 2. Perhaps mild secondary inflammation of the duodenum and some small bowel loops. No bowel obstruction. No other acute  process identified in the abdomen or pelvis.   Electronically Signed   By: Augusto GambleLee  Hall M.D.   On: 05/20/2014 11:03   Dg Chest Port 1 View  05/20/2014   CLINICAL DATA:  Short of breath at rest  EXAM: PORTABLE CHEST - 1 VIEW  COMPARISON:  03/11/2014  FINDINGS: Hypoventilation with decreased lung volume and bibasilar atelectasis left greater than right. Negative for heart failure or effusion.  IMPRESSION: Hypoventilation with bibasilar atelectasis.   Electronically Signed   By: Marlan Palauharles  Clark M.D.   On: 05/20/2014 13:22   Dg C-arm 1-60 Min-no Report  05/20/2014   CLINICAL DATA: stones   C-ARM 1-60 MINUTES  Fluoroscopy was utilized by the requesting physician.  No radiographic  interpretation.     Microbiology: Recent Results (from the past 240 hour(s))  Urine culture     Status: None   Collection Time: 05/20/14  5:39 PM  Result Value Ref Range Status   Specimen Description URINE, CATHETERIZED CYSTOSCOPY  Final   Special Requests NONE  Final   Culture  Setup Time   Final    05/21/2014 00:17 Performed at [facility]    Colony Count   Final    30,000 COLONIES/ML Performed at [facility]    Culture   Final    Multiple bacterial morphotypes present, none predominant. Suggest appropriate recollection if clinically indicated. Performed at [facility]    Report Status 05/21/2014 FINAL  Final     Labs: Basic Metabolic Panel:  Recent Labs Lab 05/20/14 0738 05/23/14 0551  NA 141 135*  K 5.2 5.5*  CL 108  105  CO2 24 20  GLUCOSE 139* 89  BUN 26* 40*  CREATININE 1.65* 2.03*  CALCIUM 9.0 8.4   Liver Function Tests:  Recent Labs Lab 05/20/14 0738  AST 15  ALT 7  ALKPHOS 60  BILITOT 0.3  PROT 6.7  ALBUMIN 3.2*    Recent Labs Lab 05/20/14 0738  LIPASE 27   No results for input(s): AMMONIA in the last 168 hours. CBC:  Recent Labs Lab 05/20/14 0738  WBC 12.8*  NEUTROABS 10.3*  HGB 11.1*  HCT 34.3*  MCV 89.6  PLT 328   Cardiac  Enzymes: No results for input(s): CKTOTAL, CKMB, CKMBINDEX, TROPONINI in the last 168 hours. BNP: BNP (last 3 results) No results for input(s): PROBNP in the last 8760 hours. CBG: No results for input(s): GLUCAP in the last 168 hours.  Principal Problem:   Ureteral stone with hydronephrosis Active Problems:   Hypothyroidism   Abdominal pain, generalized   Hypertension   Acute renal failure   Hyperglycemia   Renal atrophy   Leukocytosis   Urinary tract infection with pyuria   Time coordinating discharge: 45 minutes  Signed:  Butch PennyAngus Davianna Deutschman, MD 05/24/2014, 6:33 AM

## 2014-05-24 NOTE — Progress Notes (Signed)
[facility].  Pt sent with belongings, discharge summary and prescription.  [facility] by Tech, accompanied by her son.

## 2014-05-26 NOTE — Progress Notes (Signed)
UR chart review completed.  

## 2014-06-03 ENCOUNTER — Ambulatory Visit (INDEPENDENT_AMBULATORY_CARE_PROVIDER_SITE_OTHER): Payer: Medicare Other | Admitting: Urology

## 2014-06-03 DIAGNOSIS — N201 Calculus of ureter: Secondary | ICD-10-CM

## 2014-06-04 ENCOUNTER — Other Ambulatory Visit: Payer: Self-pay | Admitting: Urology

## 2014-06-23 ENCOUNTER — Emergency Department (HOSPITAL_COMMUNITY)
Admission: EM | Admit: 2014-06-23 | Discharge: 2014-06-23 | Disposition: A | Discharge status: Home / Self Care | Payer: Medicare Other | Attending: Emergency Medicine | Admitting: Emergency Medicine

## 2014-06-23 ENCOUNTER — Encounter (HOSPITAL_COMMUNITY): Payer: Self-pay | Admitting: *Deleted

## 2014-06-23 ENCOUNTER — Emergency Department (HOSPITAL_COMMUNITY): Payer: Medicare Other

## 2014-06-23 DIAGNOSIS — N189 Chronic kidney disease, unspecified: Secondary | ICD-10-CM | POA: Diagnosis not present

## 2014-06-23 DIAGNOSIS — Z79899 Other long term (current) drug therapy: Secondary | ICD-10-CM | POA: Insufficient documentation

## 2014-06-23 DIAGNOSIS — I129 Hypertensive chronic kidney disease with stage 1 through stage 4 chronic kidney disease, or unspecified chronic kidney disease: Secondary | ICD-10-CM | POA: Diagnosis not present

## 2014-06-23 DIAGNOSIS — G629 Polyneuropathy, unspecified: Secondary | ICD-10-CM | POA: Diagnosis not present

## 2014-06-23 DIAGNOSIS — Z87738 Personal history of other specified (corrected) congenital malformations of digestive system: Secondary | ICD-10-CM | POA: Insufficient documentation

## 2014-06-23 DIAGNOSIS — Z8739 Personal history of other diseases of the musculoskeletal system and connective tissue: Secondary | ICD-10-CM | POA: Insufficient documentation

## 2014-06-23 DIAGNOSIS — S29092A Other injury of muscle and tendon of back wall of thorax, initial encounter: Secondary | ICD-10-CM | POA: Diagnosis present

## 2014-06-23 DIAGNOSIS — Z880 Allergy status to penicillin: Secondary | ICD-10-CM | POA: Diagnosis not present

## 2014-06-23 DIAGNOSIS — Z8719 Personal history of other diseases of the digestive system: Secondary | ICD-10-CM | POA: Diagnosis not present

## 2014-06-23 DIAGNOSIS — E039 Hypothyroidism, unspecified: Secondary | ICD-10-CM | POA: Insufficient documentation

## 2014-06-23 DIAGNOSIS — N390 Urinary tract infection, site not specified: Secondary | ICD-10-CM | POA: Insufficient documentation

## 2014-06-23 DIAGNOSIS — Z8781 Personal history of (healed) traumatic fracture: Secondary | ICD-10-CM | POA: Insufficient documentation

## 2014-06-23 DIAGNOSIS — Y9389 Activity, other specified: Secondary | ICD-10-CM | POA: Insufficient documentation

## 2014-06-23 DIAGNOSIS — Y998 Other external cause status: Secondary | ICD-10-CM | POA: Insufficient documentation

## 2014-06-23 DIAGNOSIS — Y9289 Other specified places as the place of occurrence of the external cause: Secondary | ICD-10-CM | POA: Insufficient documentation

## 2014-06-23 DIAGNOSIS — S3992XA Unspecified injury of lower back, initial encounter: Secondary | ICD-10-CM | POA: Insufficient documentation

## 2014-06-23 DIAGNOSIS — W19XXXA Unspecified fall, initial encounter: Secondary | ICD-10-CM

## 2014-06-23 DIAGNOSIS — W010XXA Fall on same level from slipping, tripping and stumbling without subsequent striking against object, initial encounter: Secondary | ICD-10-CM | POA: Insufficient documentation

## 2014-06-23 LAB — URINALYSIS, ROUTINE W REFLEX MICROSCOPIC
Bilirubin Urine: NEGATIVE
Glucose, UA: NEGATIVE mg/dL
Ketones, ur: NEGATIVE mg/dL
Nitrite: POSITIVE — AB
PH: 5.5 (ref 5.0–8.0)
Protein, ur: 100 mg/dL — AB
SPECIFIC GRAVITY, URINE: 1.025 (ref 1.005–1.030)
Urobilinogen, UA: 0.2 mg/dL (ref 0.0–1.0)

## 2014-06-23 LAB — CBC WITH DIFFERENTIAL/PLATELET
BASOS ABS: 0.1 10*3/uL (ref 0.0–0.1)
Basophils Relative: 2 % — ABNORMAL HIGH (ref 0–1)
EOS ABS: 0.7 10*3/uL (ref 0.0–0.7)
Eosinophils Relative: 10 % — ABNORMAL HIGH (ref 0–5)
HCT: 36.4 % (ref 36.0–46.0)
HEMOGLOBIN: 11.4 g/dL — AB (ref 12.0–15.0)
Lymphocytes Relative: 36 % (ref 12–46)
Lymphs Abs: 2.4 10*3/uL (ref 0.7–4.0)
MCH: 28.3 pg (ref 26.0–34.0)
MCHC: 31.3 g/dL (ref 30.0–36.0)
MCV: 90.3 fL (ref 78.0–100.0)
Monocytes Absolute: 0.5 10*3/uL (ref 0.1–1.0)
Monocytes Relative: 7 % (ref 3–12)
NEUTROS PCT: 45 % (ref 43–77)
Neutro Abs: 3 10*3/uL (ref 1.7–7.7)
Platelets: 272 10*3/uL (ref 150–400)
RBC: 4.03 MIL/uL (ref 3.87–5.11)
RDW: 14.7 % (ref 11.5–15.5)
WBC: 6.6 10*3/uL (ref 4.0–10.5)

## 2014-06-23 LAB — BASIC METABOLIC PANEL
ANION GAP: 6 (ref 5–15)
BUN: 48 mg/dL — ABNORMAL HIGH (ref 6–23)
CALCIUM: 8.7 mg/dL (ref 8.4–10.5)
CO2: 20 mmol/L (ref 19–32)
CREATININE: 2.05 mg/dL — AB (ref 0.50–1.10)
Chloride: 106 mEq/L (ref 96–112)
GFR calc Af Amer: 23 mL/min — ABNORMAL LOW (ref >90)
GFR, EST NON AFRICAN AMERICAN: 20 mL/min — AB (ref >90)
Glucose, Bld: 112 mg/dL — ABNORMAL HIGH (ref 70–99)
Potassium: 5.4 mmol/L — ABNORMAL HIGH (ref 3.5–5.1)
SODIUM: 132 mmol/L — AB (ref 135–145)

## 2014-06-23 LAB — URINE MICROSCOPIC-ADD ON

## 2014-06-23 MED ORDER — CEFUROXIME AXETIL 250 MG PO TABS
250.0000 mg | ORAL_TABLET | Freq: Two times a day (BID) | ORAL | Status: DC
Start: 2014-06-23 — End: 2014-06-27

## 2014-06-23 NOTE — Discharge Instructions (Signed)

## 2014-06-23 NOTE — ED Notes (Signed)
[facility] pt c/o pain to right hip; pt is confused and unable to give any history

## 2014-06-23 NOTE — ED Provider Notes (Signed)
CSN: [id number]     Arrival date & time 06/23/14  2053 History  This chart was scribed for Linwood Dibbles, MD by Tonye Royalty, ED Scribe. This patient was seen in room APA04/APA04 and the patient's care was started at 9:28 PM.    Chief Complaint  Patient presents with  . Fall   The history is provided by the patient and the spouse. No language interpreter was used.    HPI Comments: [name] is a 81 y.o. female who presents to the Emergency Department complaining of falling at 2000 at the [facility] yesterday after tripping over a jug of motor oil. Husband states she did not pass out or strike her head. He states that she complained of pain to her back and right side after falling. She saw Dr. Ouida Sills and was then referred here. Husband notes that she has been somewhat confused for the past few days, which he suspects is due to kidney stone and UTI. He states she uses a wheelchair and does physical therapy at the [facility]. She denies pain to her neck or arms.  Past Medical History  Diagnosis Date  . Hypothyroidism   . Peripheral neuropathy   . Shortness of breath   . Hiatal hernia   . Schatzki's ring   . Hypertension   . Vertebral compression fracture   . Arthritis     right knee   Past Surgical History  Procedure Laterality Date  . Esophagogastroduodenoscopy  10/24/08    normal, status post [facility] dilator, small hiatal hernia deformity of the antrum/pylorus  . Colonoscopy  10/24/08    anal papilla otherwise normal, pan colonic diverticulum and colonic mucosa  . Appendectomy  1941  . Abdominal hysterectomy  1977  . Cholecystectomy  1967  . Spine surgery  1986  . Knee surgery  09/2008  . Hemorrhoid surgery  1999  . Colonoscopy  remote    Dr. Jerolyn Shin Smith-->polpys per patient  . Ercp  1995    Dr. Rourk--> for dilated biliary tree. no stone or neoplasm or stricture found  . Esophagogastroduodenoscopy  1995    Dr. Rourk--> empiric dilation of esophagus with resolution of dysphagia  .  Esophagogastroduodenoscopy   09/21/2011    RMR: Noncritical Schatzki's ring s/p dilated/Abnormal esophageal mucosa/Small  hiatal hermia  . Shoulder surgery    . Cystoscopy with stent placement Bilateral 05/20/2014    Procedure: CYSTOSCOPY WITH BILATERAL DOUBLE J STENT PLACEMENT;  Surgeon: Chelsea Aus, MD;  Location: [facility];  Service: Urology;  Laterality: Bilateral;   Family History  Problem Relation Age of Onset  . Heart attack Father 9    deceased   History  Substance Use Topics  . Smoking status: Never Smoker   . Smokeless tobacco: Not on file  . Alcohol Use: No   OB History    No data available     Review of Systems  Musculoskeletal: Positive for back pain. Negative for neck pain.       Right side pain  Neurological: Negative for weakness.  Psychiatric/Behavioral: Positive for confusion.  All other systems reviewed and are negative.     Allergies  Lactulose; Penicillins; and Prilosec  Home Medications   Prior to Admission medications   Medication Sig Start Date End Date Taking? Authorizing Provider  acetaminophen (PAIN RELIEVER) 325 MG tablet Take 650 mg by mouth every 6 (six) hours as needed for mild pain or moderate pain.    Historical Provider, MD  Leighton Roach (  VENELEX) OINT Apply 1 application topically 3 (three) times daily. Applied to buttock    Historical Provider, MD  ciprofloxacin (CIPRO) 500 MG tablet Take 1 tablet (500 mg total) by mouth 2 (two) times daily. Patient not taking: Reported on 06/19/2014 05/24/14   Alice ReichertAngus G McInnis, MD  gabapentin (NEURONTIN) 600 MG tablet Take 600 mg by mouth at bedtime.    Historical Provider, MD  ipratropium-albuterol (DUONEB) 0.5-2.5 (3) MG/3ML SOLN Take 3 mLs by nebulization every 6 (six) hours as needed (Shortness of breath).    Historical Provider, MD  levothyroxine (SYNTHROID, LEVOTHROID) 100 MCG tablet Take 100 mcg by mouth daily before breakfast.    Historical Provider, MD  trimethoprim (TRIMPEX) 100  MG tablet Take 100 mg by mouth at bedtime.    Historical Provider, MD   BP 153/57 mmHg  Pulse 67  Temp(Src) 97.9 F (36.6 C) (Oral)  Resp 18  Ht 5\' 1"  (1.549 m)  Wt 160 lb (72.576 kg)  BMI 30.25 kg/m2  SpO2 100% Physical Exam  Constitutional: No distress.  Elderly and frail  HENT:  Head: Normocephalic and atraumatic.  Right Ear: External ear normal.  Left Ear: External ear normal.  Eyes: Conjunctivae are normal. Right eye exhibits no discharge. Left eye exhibits no discharge. No scleral icterus.  Neck: Neck supple. No tracheal deviation present.  Cardiovascular: Normal rate, regular rhythm and intact distal pulses.   Pulmonary/Chest: Effort normal and breath sounds normal. No stridor. No respiratory distress. She has no wheezes. She has no rales.  Abdominal: Soft. Bowel sounds are normal. She exhibits no distension. There is no tenderness. There is no rebound and no guarding.  Questionable hepatomegaly  Musculoskeletal: She exhibits no edema.       Right hip: She exhibits normal range of motion, normal strength and no bony tenderness.       Left hip: Normal.       Cervical back: Normal. She exhibits no tenderness.       Thoracic back: She exhibits tenderness and bony tenderness. She exhibits no swelling and no edema.       Lumbar back: She exhibits tenderness and bony tenderness. She exhibits no swelling and no edema.  Neurological: She is alert. She is disoriented. No cranial nerve deficit (no facial droop, extraocular movements intact, no slurred speech) or sensory deficit. She exhibits normal muscle tone. She displays no seizure activity. Coordination normal.  Patient is able to sit up in bed with slight assistance \ Moves all extremities No focal deficits  Skin: Skin is warm and dry. No rash noted.  Psychiatric: She has a normal mood and affect.  Nursing note and vitals reviewed.   ED Course  Procedures (including critical care time)  DIAGNOSTIC STUDIES: Oxygen  Saturation is 100% on nasal canula, normal by my interpretation.    COORDINATION OF CARE: 9:37 PM Discussed treatment plan with patient at beside, including x-rays of spine and hip. The patient agrees with the plan and has no further questions at this time.   Labs Review Labs Reviewed  CBC WITH DIFFERENTIAL - Abnormal; Notable for the following:    Hemoglobin 11.4 (*)    Eosinophils Relative 10 (*)    Basophils Relative 2 (*)    All other components within normal limits  BASIC METABOLIC PANEL - Abnormal; Notable for the following:    Sodium 132 (*)    Potassium 5.4 (*)    Glucose, Bld 112 (*)    BUN 48 (*)    Creatinine,  Ser 2.05 (*)    GFR calc non Af Amer 20 (*)    GFR calc Af Amer 23 (*)    All other components within normal limits  URINALYSIS, ROUTINE W REFLEX MICROSCOPIC - Abnormal; Notable for the following:    APPearance CLOUDY (*)    Hgb urine dipstick LARGE (*)    Protein, ur 100 (*)    Nitrite POSITIVE (*)    Leukocytes, UA LARGE (*)    All other components within normal limits  URINE MICROSCOPIC-ADD ON - Abnormal; Notable for the following:    Squamous Epithelial / LPF MANY (*)    Bacteria, UA MANY (*)    All other components within normal limits    Imaging Review Dg Thoracic Spine W/swimmers  06/23/2014   CLINICAL DATA:  Status post fall. Acute onset of right upper back pain. Initial encounter.  EXAM: THORACIC SPINE - 2 VIEW + SWIMMERS  COMPARISON:  Chest radiograph from 05/24/2014  FINDINGS: There is no evidence of fracture or subluxation. Vertebral bodies demonstrate normal height and alignment. Multilevel vacuum phenomenon is noted along the thoracic spine. The lower cervical spine is difficult to fully assess on this study.  The visualized portions of both lungs are clear. Apparent right hilar prominence seems to be positional in nature, on correlation with prior studies. Apparent bilateral ureteral stents are noted.  IMPRESSION: No evidence of fracture or  subluxation along the thoracic spine.   Electronically Signed   By: Roanna Raider M.D.   On: 06/23/2014 22:36   Dg Lumbar Spine Complete  06/23/2014   CLINICAL DATA:  Fall, confusion, uncertain how fell, RIGHT hip and RIGHT low back pain  EXAM: LUMBAR SPINE - COMPLETE 4+ VIEW  COMPARISON:  CT abdomen pelvis 05/20/2014  FINDINGS: BILATERAL ureteral stents and ureteral calculi again identified.  12 x 9 mm diameter proximal RIGHT ureteral calculus at the level of the RIGHT iliac crest.  23 x 10 mm distal LEFT ureteral calculus in LEFT pelvis.  Bones demineralized.  Five non-rib-bearing lumbar vertebrae.  Scattered disc space narrowing and endplate spur formation.  Multilevel facet degenerative changes.  Grade 1 anterolisthesis L4-L5 similar to prior CT.  Minimal anterior height loss T12 unchanged.  Vertebral body heights appear otherwise maintained without definite fracture or subluxation.  SI joints symmetric.  No gross spondylolysis.  IMPRESSION: Degenerative disc and facet disease changes lumbar spine with persistent grade 1 anterolisthesis L4-L5.  No definite acute lumbar spine abnormalities.  Again identified BILATERAL ureteral stents and BILATERAL large ureteral calculi.   Electronically Signed   By: Ulyses Southward M.D.   On: 06/23/2014 22:43   Dg Hip Unilat With Pelvis 2-3 Views Right  06/23/2014   CLINICAL DATA:  Status post fall. Acute onset of right hip pain. Initial encounter.  EXAM: DG HIP W/ PELVIS 2-3V*R*  COMPARISON:  None.  FINDINGS: There is no evidence of fracture or dislocation. Both femoral heads are seated normally within their respective acetabula. The proximal right femur appears intact. No significant degenerative change is appreciated. The sacroiliac joints are unremarkable in appearance.  The visualized bowel gas pattern is grossly unremarkable in appearance. Scattered phleboliths are noted within the pelvis. Bilateral ureteral stents are partially imaged. A large rounded calcification at  the left hemipelvis is nonspecific in appearance.  IMPRESSION: No evidence of fracture or dislocation.   Electronically Signed   By: Roanna Raider M.D.   On: 06/23/2014 22:37     EKG Interpretation   Date/Time:  Monday  June 23 2014 22:55:53 EST Ventricular Rate:  57 PR Interval:  223 QRS Duration: 83 QT Interval:  455 QTC Calculation: 443 R Axis:   -43 Text Interpretation:  Sinus or ectopic atrial rhythm Prolonged PR interval  Abnormal R-wave progression, early transition Inferior infarct, old  Consider anterior infarct Since last tracing rate slower Confirmed by  Prescott Truex  MD-J, Tiffiny Worthy ([id number]) on 06/23/2014 11:00:17 PM      MDM   Final diagnoses:  Fall  Chronic renal failure, unspecified stage  UTI (lower urinary tract infection)    No sign of fracture associated with her fall.  Chronic UTI.  Will rx ceftin.  Recent rx for cipro.  PCN allergy is unknown but low risk with cephalosporin abx.   Renal failure is chronic.  Pt will be getting treatment by urology.  I personally performed the services described in this documentation, which was scribed in my presence.  The recorded information has been reviewed and is accurate.   Linwood Dibbles, MD 06/23/14 [phone]

## 2014-06-24 ENCOUNTER — Encounter (HOSPITAL_COMMUNITY): Payer: Self-pay | Admitting: *Deleted

## 2014-06-24 ENCOUNTER — Emergency Department (HOSPITAL_COMMUNITY)
Admission: EM | Admit: 2014-06-24 | Discharge: 2014-06-24 | Disposition: A | Discharge status: Home / Self Care | Payer: Medicare Other | Attending: Emergency Medicine | Admitting: Emergency Medicine

## 2014-06-24 ENCOUNTER — Emergency Department (HOSPITAL_COMMUNITY): Payer: Medicare Other

## 2014-06-24 DIAGNOSIS — I10 Essential (primary) hypertension: Secondary | ICD-10-CM | POA: Insufficient documentation

## 2014-06-24 DIAGNOSIS — Y9289 Other specified places as the place of occurrence of the external cause: Secondary | ICD-10-CM | POA: Insufficient documentation

## 2014-06-24 DIAGNOSIS — Z880 Allergy status to penicillin: Secondary | ICD-10-CM | POA: Insufficient documentation

## 2014-06-24 DIAGNOSIS — Z87738 Personal history of other specified (corrected) congenital malformations of digestive system: Secondary | ICD-10-CM | POA: Insufficient documentation

## 2014-06-24 DIAGNOSIS — E039 Hypothyroidism, unspecified: Secondary | ICD-10-CM | POA: Insufficient documentation

## 2014-06-24 DIAGNOSIS — Z8739 Personal history of other diseases of the musculoskeletal system and connective tissue: Secondary | ICD-10-CM | POA: Insufficient documentation

## 2014-06-24 DIAGNOSIS — G629 Polyneuropathy, unspecified: Secondary | ICD-10-CM | POA: Insufficient documentation

## 2014-06-24 DIAGNOSIS — W1839XA Other fall on same level, initial encounter: Secondary | ICD-10-CM | POA: Insufficient documentation

## 2014-06-24 DIAGNOSIS — Z79899 Other long term (current) drug therapy: Secondary | ICD-10-CM | POA: Insufficient documentation

## 2014-06-24 DIAGNOSIS — Z8719 Personal history of other diseases of the digestive system: Secondary | ICD-10-CM | POA: Diagnosis not present

## 2014-06-24 DIAGNOSIS — Z8781 Personal history of (healed) traumatic fracture: Secondary | ICD-10-CM | POA: Diagnosis not present

## 2014-06-24 DIAGNOSIS — Z23 Encounter for immunization: Secondary | ICD-10-CM | POA: Insufficient documentation

## 2014-06-24 DIAGNOSIS — Y9389 Activity, other specified: Secondary | ICD-10-CM | POA: Diagnosis not present

## 2014-06-24 DIAGNOSIS — R52 Pain, unspecified: Secondary | ICD-10-CM

## 2014-06-24 DIAGNOSIS — S0181XA Laceration without foreign body of other part of head, initial encounter: Secondary | ICD-10-CM | POA: Insufficient documentation

## 2014-06-24 DIAGNOSIS — S0990XA Unspecified injury of head, initial encounter: Secondary | ICD-10-CM

## 2014-06-24 DIAGNOSIS — Y998 Other external cause status: Secondary | ICD-10-CM | POA: Diagnosis not present

## 2014-06-24 DIAGNOSIS — W19XXXA Unspecified fall, initial encounter: Secondary | ICD-10-CM

## 2014-06-24 MED ORDER — HYDROCODONE-ACETAMINOPHEN 5-325 MG PO TABS
1.0000 | ORAL_TABLET | Freq: Once | ORAL | Status: AC
  Administered 2014-06-24: 1 via ORAL
  Filled 2014-06-24: qty 1

## 2014-06-24 MED ORDER — TETANUS-DIPHTH-ACELL PERTUSSIS 5-2.5-18.5 LF-MCG/0.5 IM SUSP
0.5000 mL | Freq: Once | INTRAMUSCULAR | Status: AC
  Administered 2014-06-24: 0.5 mL via INTRAMUSCULAR
  Filled 2014-06-24: qty 0.5

## 2014-06-24 NOTE — ED Notes (Signed)
Pt arrived back in the ED again after falling again when [facility]. Pt has appx 1.5 cm laceration to the right temple. Bleeding controled at this time. No LOC reported.

## 2014-06-24 NOTE — Progress Notes (Addendum)
                 Your procedure is scheduled on: Thursday, June 26, 2014  Report to [facility] at AM.0500  You may come in by way of the Emergency room hallway and bear to the left and follow the hall to the end you will start seeing the signs for the Short Stay Center.  Spoke with Reeves DamMariama Williams Rn at the [facility] [phone] fax number [phone].  The pre op instructions were reviewed by telephone.  Informed that the instructions would be faxed today.  A follow up phone call to confirm that the fax information was received and clear.     Call this number if you have problems the morning of surgery: [phone]    Remember: Bring insurance card and picture ID, PLEASE SEND CURRENT MEDICATION LIST AND TIME LAST MEDICATIONS TAKEN  WITH PATIENT MORNING OF SURGERY.    Do not drink liquids or  eat food:After Midnight. Wednesday, June 25, 2014    Take these medicines the morning of surgery with A SIP OF WATER:Neurontin and Synthroid May take Tylenol if needed for pain                              SEE [facility] PREPARING FOR SURGERY SHEET     Do not wear jewelry, make-up or nail polish.  Do not wear lotions, powders, or perfumes. You may wear deodorant.             Men may shave face and neck.  Do not bring valuables to the hospital.  Contacts, dentures or bridgework may not be worn into surgery.  Leave suitcase in the car. After surgery it may be brought to your room.  For patients admitted to the hospital, checkout time is 11:00 AM the day of discharge.   Patients discharged the day of surgery will not be allowed to drive home.  Name and phone number of your driver: [name]  Son to transport [phone]                          Call [name] Naponie Bryant, RN pre op nurse if needed [phone]  Main Number [phone]               FAILURE TO FOLLOW THESE INSTRUCTIONS MAY RESULT IN THE            CANCELLATION OF YOUR SURGERY.     PATIENT    SIGNATURE___________________________________________________

## 2014-06-24 NOTE — ED Notes (Signed)
Pt fell, has small laceration over right eye brow, bleeding controlled at this time; pt c/o her teeth hurting, no teeth were misplaced

## 2014-06-24 NOTE — ED Notes (Signed)
Report called to nicki at the [facility].

## 2014-06-24 NOTE — ED Notes (Signed)
Pt carried [facility] staff.

## 2014-06-24 NOTE — Discharge Instructions (Signed)
Tissue Adhesive Wound Care °Some cuts, wounds, lacerations, and incisions can be repaired by using tissue adhesive. Tissue adhesive is like glue. It holds the skin together, allowing for faster healing. It forms a strong bond on the skin in about 1 minute and reaches its full strength in about 2 or 3 minutes. The adhesive disappears naturally while the wound is healing. It is important to take proper care of your wound at home while it heals.  °HOME CARE INSTRUCTIONS  °· Showers are allowed. Do not soak the area containing the tissue adhesive. Do not take baths, swim, or use hot tubs. Do not use any soaps or ointments on the wound. Certain ointments can weaken the glue. °· If a bandage (dressing) has been applied, follow your health care provider's instructions for how often to change the dressing.   °· Keep the dressing dry if one has been applied.   °· Do not scratch, pick, or rub the adhesive.   °· Do not place tape over the adhesive. The adhesive could come off when pulling the tape off.   °· Protect the wound from further injury until it is healed.   °· Protect the wound from sun and tanning bed exposure while it is healing and for several weeks after healing.   °· Only take over-the-counter or prescription medicines as directed by your health care provider.   °· Keep all follow-up appointments as directed by your health care provider. °SEEK IMMEDIATE MEDICAL CARE IF:  °· Your wound becomes red, swollen, hot, or tender.   °· You develop a rash after the glue is applied. °· You have increasing pain in the wound.   °· You have a red streak that goes away from the wound.   °· You have pus coming from the wound.   °· You have increased bleeding. °· You have a fever. °· You have shaking chills.   °· You notice a bad smell coming from the wound.   °· Your wound or adhesive breaks open.   °MAKE SURE YOU:  °· Understand these instructions. °· Will watch your condition. °· Will get help right away if you are not doing  well or get worse. °Document Released: 11/23/2000 Document Revised: 03/20/2013 Document Reviewed: 12/19/2012 °ExitCare® Patient Information ©2015 [employer]. This information is not intended to replace advice given to you by your health care provider. Make sure you discuss any questions you have with your health care provider. ° °

## 2014-06-24 NOTE — ED Provider Notes (Signed)
CSN: [id number]     Arrival date & time 06/24/14  0029 History  This chart was scribed for Lyanne Co, MD by Tonye Royalty, ED Scribe. This patient was seen in room APA19/APA19 and the patient's care was started at 12:40 AM.    Chief Complaint  Patient presents with  . Fall   The history is provided by the patient and a relative. No language interpreter was used.    HPI Comments: [name] is a 81 y.o. female with history of arthritis to her right knee who presents to the Emergency Department complaining of pain to her right leg and hip and wound to her head after falling today just PTA. She was evaluated here earlier tonight for a previous fall resulting in back and right side pain with benign imaging studies. She fell again after discharge and returning to the [facility]. Son notes she has UTI and kidney stones. She denies neck pain, nausea, or vomiting.  Past Medical History  Diagnosis Date  . Hypothyroidism   . Peripheral neuropathy   . Shortness of breath   . Hiatal hernia   . Schatzki's ring   . Hypertension   . Vertebral compression fracture   . Arthritis     right knee   Past Surgical History  Procedure Laterality Date  . Esophagogastroduodenoscopy  10/24/08    normal, status post [facility] dilator, small hiatal hernia deformity of the antrum/pylorus  . Colonoscopy  10/24/08    anal papilla otherwise normal, pan colonic diverticulum and colonic mucosa  . Appendectomy  1941  . Abdominal hysterectomy  1977  . Cholecystectomy  1967  . Spine surgery  1986  . Knee surgery  09/2008  . Hemorrhoid surgery  1999  . Colonoscopy  remote    Dr. Jerolyn Shin Smith-->polpys per patient  . Ercp  1995    Dr. Rourk--> for dilated biliary tree. no stone or neoplasm or stricture found  . Esophagogastroduodenoscopy  1995    Dr. Rourk--> empiric dilation of esophagus with resolution of dysphagia  . Esophagogastroduodenoscopy   09/21/2011    RMR: Noncritical Schatzki's ring s/p dilated/Abnormal  esophageal mucosa/Small  hiatal hermia  . Shoulder surgery    . Cystoscopy with stent placement Bilateral 05/20/2014    Procedure: CYSTOSCOPY WITH BILATERAL DOUBLE J STENT PLACEMENT;  Surgeon: Chelsea Aus, MD;  Location: [facility];  Service: Urology;  Laterality: Bilateral;   Family History  Problem Relation Age of Onset  . Heart attack Father 97    deceased   History  Substance Use Topics  . Smoking status: Never Smoker   . Smokeless tobacco: Not on file  . Alcohol Use: No   OB History    No data available     Review of Systems A complete 10 system review of systems was obtained and all systems are negative except as noted in the HPI and PMH.    Allergies  Lactulose; Penicillins; and Prilosec  Home Medications   Prior to Admission medications   Medication Sig Start Date End Date Taking? Authorizing Provider  acetaminophen (PAIN RELIEVER) 325 MG tablet Take 650 mg by mouth every 6 (six) hours as needed for mild pain or moderate pain.    Historical Provider, MD  Des[facility]) OINT Apply 1 application topically 3 (three) times daily. Applied to buttock    Historical Provider, MD  cefUROXime (CEFTIN) 250 MG tablet Take 1 tablet (250 mg total) by mouth 2 (two) times daily with a  meal. 06/23/14   Linwood Dibbles, MD  ciprofloxacin (CIPRO) 500 MG tablet Take 1 tablet (500 mg total) by mouth 2 (two) times daily. Patient not taking: Reported on 06/19/2014 05/24/14   [name], MD  gabapentin (NEURONTIN) 600 MG tablet Take 600 mg by mouth at bedtime.    Historical Provider, MD  ipratropium-albuterol (DUONEB) 0.5-2.5 (3) MG/3ML SOLN Take 3 mLs by nebulization every 6 (six) hours as needed (Shortness of breath).    Historical Provider, MD  levothyroxine (SYNTHROID, LEVOTHROID) 100 MCG tablet Take 100 mcg by mouth daily before breakfast.    Historical Provider, MD  trimethoprim (TRIMPEX) 100 MG tablet Take 100 mg by mouth at bedtime.    Historical Provider, MD   There  were no vitals taken for this visit. Physical Exam  Constitutional: She is oriented to person, place, and time. She appears well-developed and well-nourished. No distress.  HENT:  Head: Normocephalic and atraumatic.  Eyes: EOM are normal.  Small 0.5 cm laceration over right periorbital region laterally  Neck: Normal range of motion.  No C-spine tenderness  Cardiovascular: Normal rate, regular rhythm and normal heart sounds.   Pulmonary/Chest: Effort normal and breath sounds normal.  Abdominal: Soft. She exhibits no distension. There is no tenderness.  Musculoskeletal: Normal range of motion.  Mild mid-pelvic tenderness Full ROM of bilateral hips and knees Mild tenderness to right ankle without obvious deformity  Neurological: She is alert and oriented to person, place, and time.  Skin: Skin is warm and dry.  Psychiatric: She has a normal mood and affect. Judgment normal.  Nursing note and vitals reviewed.   ED Course  Procedures (including critical care time)  LACERATION REPAIR Performed by: Lyanne Co Consent: Verbal consent obtained. Risks and benefits: risks, benefits and alternatives were discussed Patient identity confirmed: provided demographic data Time out performed prior to procedure Prepped and Draped in normal sterile fashion Wound explored Laceration Location: right lateral periorbital region Laceration Length: 1cm No Foreign Bodies seen or palpated Anesthesia: None  Amount of cleaning: standard Skin closure: Dermabond  Number of sutures or staples: Dermabond  Technique: Dermabond  Patient tolerance: Patient tolerated the procedure well with no immediate complications.    DIAGNOSTIC STUDIES: Oxygen Saturation is 100% on room air, normal by my interpretation.    COORDINATION OF CARE: 12:48 AM Discussed treatment plan with patient at beside, including x-ray of hip and CT scan of her head. The patient agrees with the plan and has no further questions at  this time.   Labs Review Labs Reviewed - No data to display  Imaging Review Dg Thoracic Spine W/swimmers  06/23/2014   CLINICAL DATA:  Status post fall. Acute onset of right upper back pain. Initial encounter.  EXAM: THORACIC SPINE - 2 VIEW + SWIMMERS  COMPARISON:  Chest radiograph from 05/24/2014  FINDINGS: There is no evidence of fracture or subluxation. Vertebral bodies demonstrate normal height and alignment. Multilevel vacuum phenomenon is noted along the thoracic spine. The lower cervical spine is difficult to fully assess on this study.  The visualized portions of both lungs are clear. Apparent right hilar prominence seems to be positional in nature, on correlation with prior studies. Apparent bilateral ureteral stents are noted.  IMPRESSION: No evidence of fracture or subluxation along the thoracic spine.   Electronically Signed   By: Roanna Raider M.D.   On: 06/23/2014 22:36   Dg Lumbar Spine Complete  06/23/2014   CLINICAL DATA:  Fall, confusion, uncertain how fell, RIGHT hip  and RIGHT low back pain  EXAM: LUMBAR SPINE - COMPLETE 4+ VIEW  COMPARISON:  CT abdomen pelvis 05/20/2014  FINDINGS: BILATERAL ureteral stents and ureteral calculi again identified.  12 x 9 mm diameter proximal RIGHT ureteral calculus at the level of the RIGHT iliac crest.  23 x 10 mm distal LEFT ureteral calculus in LEFT pelvis.  Bones demineralized.  Five non-rib-bearing lumbar vertebrae.  Scattered disc space narrowing and endplate spur formation.  Multilevel facet degenerative changes.  Grade 1 anterolisthesis L4-L5 similar to prior CT.  Minimal anterior height loss T12 unchanged.  Vertebral body heights appear otherwise maintained without definite fracture or subluxation.  SI joints symmetric.  No gross spondylolysis.  IMPRESSION: Degenerative disc and facet disease changes lumbar spine with persistent grade 1 anterolisthesis L4-L5.  No definite acute lumbar spine abnormalities.  Again identified BILATERAL ureteral  stents and BILATERAL large ureteral calculi.   Electronically Signed   By: Ulyses SouthwardMark  Boles M.D.   On: 06/23/2014 22:43   Dg Hip Unilat With Pelvis 2-3 Views Right  06/23/2014   CLINICAL DATA:  Status post fall. Acute onset of right hip pain. Initial encounter.  EXAM: DG HIP W/ PELVIS 2-3V*R*  COMPARISON:  None.  FINDINGS: There is no evidence of fracture or dislocation. Both femoral heads are seated normally within their respective acetabula. The proximal right femur appears intact. No significant degenerative change is appreciated. The sacroiliac joints are unremarkable in appearance.  The visualized bowel gas pattern is grossly unremarkable in appearance. Scattered phleboliths are noted within the pelvis. Bilateral ureteral stents are partially imaged. A large rounded calcification at the left hemipelvis is nonspecific in appearance.  IMPRESSION: No evidence of fracture or dislocation.   Electronically Signed   By: Roanna RaiderJeffery  Chang M.D.   On: 06/23/2014 22:37  I personally reviewed the imaging tests through PACS system I reviewed available ER/hospitalization records through the EMR    EKG Interpretation None      MDM   Final diagnoses:  None    Fall.  X-rays negative.  Head CT normal.  Laceration repaired.  Close PCP follow-up.  Vital signs are normal.  I personally performed the services described in this documentation, which was scribed in my presence. The recorded information has been reviewed and is accurate.      Lyanne CoKevin M Lilleigh Hechavarria, MD 06/24/14 [phone]

## 2014-06-25 MED ORDER — GENTAMICIN SULFATE 40 MG/ML IJ SOLN
100.0000 mg | Freq: Once | INTRAVENOUS | Status: AC
  Administered 2014-06-26: 100 mg via INTRAVENOUS
  Filled 2014-06-25: qty 2.5

## 2014-06-25 NOTE — Anesthesia Preprocedure Evaluation (Addendum)
Anesthesia Evaluation  Patient identified by MRN, date of birth, ID band Patient awake    Reviewed: Allergy & Precautions, H&P , NPO status , Patient's Chart, lab work & pertinent test results  Airway Mallampati: II  TM Distance: >3 FB Neck ROM: full    Dental no notable dental hx. (+) Teeth Intact, Dental Advisory Given   Pulmonary neg pulmonary ROS, shortness of breath and with exertion,  hoarseness breath sounds clear to auscultation  Pulmonary exam normal       Cardiovascular Exercise Tolerance: Good hypertension, negative cardio ROS  Rhythm:regular Rate:Normal     Neuro/Psych dementia negative neurological ROS  negative psych ROS   GI/Hepatic negative GI ROS, Neg liver ROS, hiatal hernia,   Endo/Other  negative endocrine ROSHypothyroidism   Renal/GU Renal InsufficiencyRenal diseasenegative Renal ROSCRT 2.0. BUN 48  negative genitourinary   Musculoskeletal   Abdominal   Peds  Hematology negative hematology ROS (+)   Anesthesia Other Findings   Reproductive/Obstetrics negative OB ROS                            Anesthesia Physical Anesthesia Plan  ASA: III  Anesthesia Plan: General   Post-op Pain Management:    Induction: Intravenous  Airway Management Planned: LMA  Additional Equipment:   Intra-op Plan:   Post-operative Plan: Extubation in OR  Informed Consent: I have reviewed the patients History and Physical, chart, labs and discussed the procedure including the risks, benefits and alternatives for the proposed anesthesia with the patient or authorized representative who has indicated his/her understanding and acceptance.   Dental Advisory Given  Plan Discussed with: CRNA and Surgeon  Anesthesia Plan Comments:         Anesthesia Quick Evaluation

## 2014-06-26 ENCOUNTER — Ambulatory Visit (HOSPITAL_COMMUNITY): Payer: Medicare Other | Admitting: Certified Registered Nurse Anesthetist

## 2014-06-26 ENCOUNTER — Encounter (HOSPITAL_COMMUNITY)
Admission: RE | Disposition: A | Discharge status: Skilled Nursing Facility | Payer: Self-pay | Source: Ambulatory Visit | Attending: Urology

## 2014-06-26 ENCOUNTER — Observation Stay (HOSPITAL_COMMUNITY)
Admission: RE | Admit: 2014-06-26 | Discharge: 2014-06-27 | Disposition: A | Discharge status: Skilled Nursing Facility | Payer: Medicare Other | Source: Ambulatory Visit | Attending: Urology | Admitting: Urology

## 2014-06-26 ENCOUNTER — Encounter (HOSPITAL_COMMUNITY): Payer: Self-pay | Admitting: *Deleted

## 2014-06-26 DIAGNOSIS — N201 Calculus of ureter: Principal | ICD-10-CM | POA: Diagnosis present

## 2014-06-26 DIAGNOSIS — Z87440 Personal history of urinary (tract) infections: Secondary | ICD-10-CM | POA: Insufficient documentation

## 2014-06-26 DIAGNOSIS — G629 Polyneuropathy, unspecified: Secondary | ICD-10-CM | POA: Insufficient documentation

## 2014-06-26 DIAGNOSIS — I10 Essential (primary) hypertension: Secondary | ICD-10-CM | POA: Insufficient documentation

## 2014-06-26 DIAGNOSIS — E039 Hypothyroidism, unspecified: Secondary | ICD-10-CM | POA: Insufficient documentation

## 2014-06-26 DIAGNOSIS — M13861 Other specified arthritis, right knee: Secondary | ICD-10-CM | POA: Diagnosis not present

## 2014-06-26 DIAGNOSIS — K449 Diaphragmatic hernia without obstruction or gangrene: Secondary | ICD-10-CM | POA: Diagnosis not present

## 2014-06-26 DIAGNOSIS — Z880 Allergy status to penicillin: Secondary | ICD-10-CM | POA: Insufficient documentation

## 2014-06-26 DIAGNOSIS — Z888 Allergy status to other drugs, medicaments and biological substances status: Secondary | ICD-10-CM | POA: Insufficient documentation

## 2014-06-26 DIAGNOSIS — R0602 Shortness of breath: Secondary | ICD-10-CM | POA: Insufficient documentation

## 2014-06-26 HISTORY — PX: HOLMIUM LASER APPLICATION: SHX5852

## 2014-06-26 LAB — BASIC METABOLIC PANEL
Anion gap: 6 (ref 5–15)
BUN: 49 mg/dL — ABNORMAL HIGH (ref 6–23)
CO2: 22 mmol/L (ref 19–32)
Calcium: 8.8 mg/dL (ref 8.4–10.5)
Chloride: 107 mEq/L (ref 96–112)
Creatinine, Ser: 1.91 mg/dL — ABNORMAL HIGH (ref 0.50–1.10)
GFR calc Af Amer: 25 mL/min — ABNORMAL LOW (ref >90)
GFR calc non Af Amer: 22 mL/min — ABNORMAL LOW (ref >90)
Glucose, Bld: 102 mg/dL — ABNORMAL HIGH (ref 70–99)
Potassium: 5.5 mmol/L — ABNORMAL HIGH (ref 3.5–5.1)
Sodium: 135 mmol/L (ref 135–145)

## 2014-06-26 LAB — CBC
HCT: 36 % (ref 36.0–46.0)
HEMOGLOBIN: 11 g/dL — AB (ref 12.0–15.0)
MCH: 28.2 pg (ref 26.0–34.0)
MCHC: 30.6 g/dL (ref 30.0–36.0)
MCV: 92.3 fL (ref 78.0–100.0)
Platelets: 256 10*3/uL (ref 150–400)
RBC: 3.9 MIL/uL (ref 3.87–5.11)
RDW: 15 % (ref 11.5–15.5)
WBC: 7.6 10*3/uL (ref 4.0–10.5)

## 2014-06-26 SURGERY — CYSTOSCOPY/URETEROSCOPY/HOLMIUM LASER/STENT PLACEMENT
Anesthesia: General | Laterality: Bilateral

## 2014-06-26 MED ORDER — DOCUSATE SODIUM 100 MG PO CAPS
100.0000 mg | ORAL_CAPSULE | Freq: Two times a day (BID) | ORAL | Status: DC
  Administered 2014-06-27: 100 mg via ORAL
  Filled 2014-06-26 (×3): qty 1

## 2014-06-26 MED ORDER — GABAPENTIN 300 MG PO CAPS
600.0000 mg | ORAL_CAPSULE | Freq: Every day | ORAL | Status: DC
  Filled 2014-06-26 (×2): qty 2

## 2014-06-26 MED ORDER — ONDANSETRON HCL 4 MG/2ML IJ SOLN
INTRAMUSCULAR | Status: DC | PRN
  Administered 2014-06-26: 4 mg via INTRAVENOUS

## 2014-06-26 MED ORDER — PROPOFOL 10 MG/ML IV BOLUS
INTRAVENOUS | Status: AC
  Filled 2014-06-26: qty 20

## 2014-06-26 MED ORDER — FENTANYL CITRATE 0.05 MG/ML IJ SOLN
INTRAMUSCULAR | Status: DC | PRN
  Administered 2014-06-26 (×4): 25 ug via INTRAVENOUS

## 2014-06-26 MED ORDER — LACTATED RINGERS IV SOLN
INTRAVENOUS | Status: DC | PRN
  Administered 2014-06-26 (×2): via INTRAVENOUS

## 2014-06-26 MED ORDER — PHENYLEPHRINE 40 MCG/ML (10ML) SYRINGE FOR IV PUSH (FOR BLOOD PRESSURE SUPPORT)
PREFILLED_SYRINGE | INTRAVENOUS | Status: AC
  Filled 2014-06-26: qty 10

## 2014-06-26 MED ORDER — ONDANSETRON HCL 4 MG/2ML IJ SOLN
INTRAMUSCULAR | Status: AC
  Filled 2014-06-26: qty 2

## 2014-06-26 MED ORDER — SODIUM CHLORIDE 0.9 % IJ SOLN
INTRAMUSCULAR | Status: AC
  Filled 2014-06-26: qty 10

## 2014-06-26 MED ORDER — HYDROCODONE-ACETAMINOPHEN 5-325 MG PO TABS
1.0000 | ORAL_TABLET | ORAL | Status: DC | PRN
  Administered 2014-06-26: 1 via ORAL
  Filled 2014-06-26: qty 1

## 2014-06-26 MED ORDER — ATROPINE SULFATE 0.4 MG/ML IJ SOLN
INTRAMUSCULAR | Status: AC
  Filled 2014-06-26: qty 1

## 2014-06-26 MED ORDER — LEVOTHYROXINE SODIUM 100 MCG PO TABS
100.0000 ug | ORAL_TABLET | Freq: Every day | ORAL | Status: DC
  Administered 2014-06-27: 100 ug via ORAL
  Filled 2014-06-26: qty 1

## 2014-06-26 MED ORDER — EPHEDRINE SULFATE 50 MG/ML IJ SOLN
INTRAMUSCULAR | Status: DC | PRN
  Administered 2014-06-26 (×2): 5 mg via INTRAVENOUS
  Administered 2014-06-26: 10 mg via INTRAVENOUS
  Administered 2014-06-26: 5 mg via INTRAVENOUS

## 2014-06-26 MED ORDER — ACETAMINOPHEN 325 MG PO TABS: 650.0000 mg | ORAL_TABLET | ORAL | Status: DC | PRN

## 2014-06-26 MED ORDER — SODIUM CHLORIDE 0.9 % IR SOLN
Status: DC | PRN
  Administered 2014-06-26: 5000 mL

## 2014-06-26 MED ORDER — LIDOCAINE HCL 2 % EX GEL
CUTANEOUS | Status: AC
  Filled 2014-06-26: qty 10

## 2014-06-26 MED ORDER — SODIUM CHLORIDE 0.45 % IV SOLN
INTRAVENOUS | Status: DC
  Administered 2014-06-26 – 2014-06-27 (×2): via INTRAVENOUS

## 2014-06-26 MED ORDER — ONDANSETRON HCL 4 MG/2ML IJ SOLN: 4.0000 mg | INTRAMUSCULAR | Status: DC | PRN

## 2014-06-26 MED ORDER — PROPOFOL 10 MG/ML IV BOLUS
INTRAVENOUS | Status: DC | PRN
  Administered 2014-06-26: 50 mg via INTRAVENOUS
  Administered 2014-06-26: 10 mg via INTRAVENOUS
  Administered 2014-06-26: 80 mg via INTRAVENOUS

## 2014-06-26 MED ORDER — LIDOCAINE HCL (CARDIAC) 20 MG/ML IV SOLN
INTRAVENOUS | Status: DC | PRN
  Administered 2014-06-26: 70 mg via INTRAVENOUS

## 2014-06-26 MED ORDER — FENTANYL CITRATE 0.05 MG/ML IJ SOLN
25.0000 ug | INTRAMUSCULAR | Status: DC | PRN
  Administered 2014-06-26 (×2): 25 ug via INTRAVENOUS

## 2014-06-26 MED ORDER — FENTANYL CITRATE 0.05 MG/ML IJ SOLN
INTRAMUSCULAR | Status: AC
  Filled 2014-06-26: qty 2

## 2014-06-26 MED ORDER — ENOXAPARIN SODIUM 30 MG/0.3ML ~~LOC~~ SOLN
30.0000 mg | SUBCUTANEOUS | Status: DC
  Administered 2014-06-26: 30 mg via SUBCUTANEOUS
  Filled 2014-06-26 (×2): qty 0.3

## 2014-06-26 MED ORDER — PHENYLEPHRINE HCL 10 MG/ML IJ SOLN
INTRAMUSCULAR | Status: DC | PRN
  Administered 2014-06-26: 80 ug via INTRAVENOUS
  Administered 2014-06-26: 40 ug via INTRAVENOUS
  Administered 2014-06-26 (×3): 80 ug via INTRAVENOUS

## 2014-06-26 MED ORDER — TRIMETHOPRIM 100 MG PO TABS: 100.0000 mg | ORAL_TABLET | Freq: Every day | ORAL | Status: DC

## 2014-06-26 MED ORDER — EPHEDRINE SULFATE 50 MG/ML IJ SOLN
INTRAMUSCULAR | Status: AC
  Filled 2014-06-26: qty 1

## 2014-06-26 MED ORDER — LACTATED RINGERS IV SOLN: INTRAVENOUS | Status: DC

## 2014-06-26 MED ORDER — LORAZEPAM 0.5 MG PO TABS: 0.5000 mg | ORAL_TABLET | Freq: Four times a day (QID) | ORAL | Status: DC | PRN

## 2014-06-26 MED ORDER — GENTAMICIN SULFATE 40 MG/ML IJ SOLN
75.0000 mg | INTRAVENOUS | Status: DC
  Filled 2014-06-26: qty 2

## 2014-06-26 MED ORDER — IPRATROPIUM-ALBUTEROL 0.5-2.5 (3) MG/3ML IN SOLN: 3.0000 mL | Freq: Four times a day (QID) | RESPIRATORY_TRACT | Status: DC | PRN

## 2014-06-26 SURGICAL SUPPLY — 29 items
BAG URINE DRAINAGE (UROLOGICAL SUPPLIES) ×2 IMPLANT
BAG URO CATCHER STRL LF (DRAPE) ×3 IMPLANT
BASKET ZERO TIP NITINOL 2.4FR (BASKET) ×4 IMPLANT
BSKT STON RTRVL ZERO TP 2.4FR (BASKET) ×2
CATH FOLEY 2WAY SLVR  5CC 16FR (CATHETERS) ×2
CATH FOLEY 2WAY SLVR 5CC 16FR (CATHETERS) IMPLANT
CATH INTERMIT  6FR 70CM (CATHETERS) ×2 IMPLANT
CLOTH BEACON ORANGE TIMEOUT ST (SAFETY) ×3 IMPLANT
DRAPE CAMERA CLOSED 9X96 (DRAPES) ×3 IMPLANT
FIBER LASER FLEXIVA 1000 (UROLOGICAL SUPPLIES) ×1 IMPLANT
FIBER LASER FLEXIVA 200 (UROLOGICAL SUPPLIES) ×1 IMPLANT
FIBER LASER FLEXIVA 365 (UROLOGICAL SUPPLIES) ×3 IMPLANT
FIBER LASER FLEXIVA 550 (UROLOGICAL SUPPLIES) ×1 IMPLANT
FIBER LASER TRAC TIP (UROLOGICAL SUPPLIES) ×1 IMPLANT
GLOVE BIOGEL M STRL SZ7.5 (GLOVE) ×11 IMPLANT
GOWN STRL REUS W/ TWL XL LVL3 (GOWN DISPOSABLE) ×1 IMPLANT
GOWN STRL REUS W/TWL LRG LVL3 (GOWN DISPOSABLE) ×6 IMPLANT
GOWN STRL REUS W/TWL XL LVL3 (GOWN DISPOSABLE) ×3
GUIDEWIRE ANG ZIPWIRE 038X150 (WIRE) IMPLANT
GUIDEWIRE STR DUAL SENSOR (WIRE) ×3 IMPLANT
IV NS 1000ML (IV SOLUTION)
IV NS 1000ML BAXH (IV SOLUTION) ×1 IMPLANT
MANIFOLD NEPTUNE II (INSTRUMENTS) ×3 IMPLANT
PACK CYSTO (CUSTOM PROCEDURE TRAY) ×3 IMPLANT
SHEATH ACCESS URETERAL 24CM (SHEATH) ×2 IMPLANT
STENT CONTOUR 6FRX24X.038 (STENTS) ×4 IMPLANT
SYRINGE IRR TOOMEY STRL 70CC (SYRINGE) ×2 IMPLANT
TUBING CONNECTING 10 (TUBING) ×2 IMPLANT
TUBING CONNECTING 10' (TUBING) ×1

## 2014-06-26 NOTE — H&P (Signed)
" H&P  Chief Complaint: Kidney stones  History of Present Illness: [name] is a 81 y.o. year old female who presents at this time for management of bilateral ureteral stones. She presented to [facility] in December with urinary tract infection sepsis, and was found to have bilateral obstructing ureteral stones. More than likely, her left stone had been there for quite some time, and had apparently been conservatively managed by the urologist previously at that hospital. Because of her urinary tract infection/hydronephrosis/sepsis, she was stented and treated with antibiotics. She presents at this time for ureteroscopic management.  Past Medical History  Diagnosis Date  . Hypothyroidism   . Shortness of breath   . Schatzki's ring   . Hypertension   . Vertebral compression fracture   . Arthritis     right knee  . Peripheral neuropathy   . Hiatal hernia     Past Surgical History  Procedure Laterality Date  . Esophagogastroduodenoscopy  10/24/08    normal, status post [facility] dilator, small hiatal hernia deformity of the antrum/pylorus  . Colonoscopy  10/24/08    anal papilla otherwise normal, pan colonic diverticulum and colonic mucosa  . Appendectomy  1941  . Abdominal hysterectomy  1977  . Cholecystectomy  1967  . Spine surgery  1986  . Knee surgery  09/2008  . Hemorrhoid surgery  1999  . Colonoscopy  remote    Dr. Jerolyn Shin Smith-->polpys per patient  . Ercp  1995    Dr. Rourk--> for dilated biliary tree. no stone or neoplasm or stricture found  . Esophagogastroduodenoscopy  1995    Dr. Rourk--> empiric dilation of esophagus with resolution of dysphagia  . Esophagogastroduodenoscopy   09/21/2011    RMR: Noncritical Schatzki's ring s/p dilated/Abnormal esophageal mucosa/Small  hiatal hermia  . Shoulder surgery    . Cystoscopy with stent placement Bilateral 05/20/2014    Procedure: CYSTOSCOPY WITH BILATERAL DOUBLE J STENT PLACEMENT;  Surgeon: Chelsea Aus, MD;   Location: [facility];  Service: Urology;  Laterality: Bilateral;    Home Medications:  No prescriptions prior to admission    Allergies:  Allergies  Allergen Reactions  . Lactulose     REACTION: unknown reaction  . Penicillins     REACTION: unknown reaction  . Prilosec [Omeprazole] Itching and Rash    Family History  Problem Relation Age of Onset  . Heart attack Father 66    deceased    Social History:  reports that she has never smoked. She does not have any smokeless tobacco history on file. She reports that she does not drink alcohol or use illicit drugs.  ROS: A complete review of systems was performed.  All systems are negative except for pertinent findings as noted.  Physical Exam:  Vital signs in last 24 hours:   General:  Alert and oriented, No acute distress HEENT: Normocephalic, atraumatic Neck: No JVD or lymphadenopathy Cardiovascular: Regular rate and rhythm Lungs: Clear bilaterally Abdomen: Soft, nontender, nondistended, no abdominal masses Back: No CVA tenderness Extremities: No edema Neurologic: Grossly intact  Laboratory Data:    No results found for this or any previous visit (from the past 24 hour(s)). No results found for this or any previous visit (from the past 240 hour(s)). Creatinine:  Recent Labs  06/23/14 2158  CREATININE 2.05*    Radiologic Imaging: No results found.  Impression/Assessment:  Bilateral ureteral stones, large (left stone 2 cm long, right stone approximately 12 mm long), status post stenting  Plan:  Bilateral ureteroscopy, holmium laser and extraction of ureteral stones, double-J stenting  [name] 06/26/2014, 3:45 AM  Bertram MillardStephen [name]. Morissa Obeirne MD

## 2014-06-26 NOTE — Op Note (Signed)
Preoperative diagnosis: Bilateral large ureteral calculi-2 cm on left, 12 mm on right  Postoperative diagnosis: Same  Procedure: Cystoscopy, bilateral double-J stent extraction, bilateral ureteroscopy, holmium laser and extraction of bilateral ureteral stones (large, extra degree of difficulty)  Surgeon: Kimberlly Norgard  Anesthesia: Gen. LMA  Specimens: Stone fragments, to the patient's family  Drains: Bilateral 24 cm x 6 French contour double-J stent without string, 16 French Foley catheter  Complications none  Indications: 81 year old female who presented with urinary tract infection, sepsis and bilateral obstructing ureteral stones in [name], 2015 and [name]. The patient was urgently stented and treated with appropriate IV antibiotics. She has recovered, and at this point, because of her large stone burden, it was recommended that she have her stones treated and not followed with surveillance, as she did present with sepsis and infection as well as her bilateral hydronephrosis. Treatment options were discussed-bilateral ureteroscopy, open surgery versus lithotripsy. It was felt that ureteroscopy with so least invasive, and most likely to 60 with one treatment. The patient presents at this time for that procedure, with both the patient and her family aware of risks and complications which have been discussed.  Description of procedure:  The patient was identified in the holding area. She is taken the operating room where general anesthetic was administered with the LMA. She received preoperative IV gentamicin per pharmacy dosing. She was placed in the dorsolithotomy position, genitalia and perineum were prepped and draped. Timeout was then performed.  I placed a 22 French cystoscope in her bladder which was normal except for the 2 stents present, previously placed. No  urothelial lesions were noted. I sent a bladder washing for culture. I then partially extracted the left double-J stent,  passed a sensor-tip guidewire through it, and extracted the stent over the guidewire once the proximal to the guidewire was fluoroscopically within the renal pelvis. The 6 French short rigid ureteroscope was advanced into the bladder and then up to the left ureteral stone which was quite large. The 365  laser fiber was then used to fragment the large, 2 cm left ureteral stone. Significant laser energy was applied directly to the stone, with multiple small and large fragments produce. The smaller fragments were extracted using the Nitinol basket. The larger fragments were fragmented again with laser energy area over. Of approximate 1 hour, the stone was adequately fragmented and extracted. Inspection of the entire ureter with the scope revealed no significant large or small fragments, but of small amount of stone dust remaining which was felt to be able to wash out without difficulty. Once the stone was adequately fragmented and removed, the double-J stent was replaced over the safety guidewire. A 24 cm x 6 [place] contour stent was used. Adequate proximal and distal curls were seen following removal of the guidewire.  I then turned my attention to the right side. The right double-J stent was grasped and partially extracted, with the guidewire advanced through the stent, the stent was then removed over top of the guidewire which had been positioned proximally within the right renal pelvis. I then used the rigid ureteroscope to traverse the bladder and right ureter, with the stone being identified in the proximal ureter. Again, the laser fiber was used to fragment the 12 mm, previously measured stone into smaller fragments. Approximate half of the stone burden was extracted. At this point, the remaining stone burden rinsed up into the right renal pelvis and calyceal system. The rigid ureteroscope could not be useful for the rest of  this procedure. I then, over the guidewire, placed a short ureteral access sheath. I  then placed the digital flexible ureteroscope and advanced into the right renal pelvis. Calyces were inspected, the stone fragments were identified. Smaller ones were removed, larger ones were then fragmented again with laser energy and extracted using the Nitinol basket. At this point, I saw no significant stone fragments remaining in the pyelo-calyceal system or in the right ureter. I then replaced the sensor-tip guidewire through the ureteral access sheath, and remove the access sheath. Using cystoscope, a 24 cm x 6 French contour stent was placed using fluoroscopic and cystoscopic utilization, with excellent proximal and distal curl seen after removal of the guidewire. Fragments were then irrigated out of the patient's bladder with the [facility] syringe. These were saved for specimen.  I then placed a 16 French Foley catheter and hooked this to dependent drainage after the balloon was filled with 10 mL of water. The patient tolerated procedure well. She was awakened and taken to PACU in stable condition.  This was an unusual case, with very large stone burden managed ureteroscopically rather than with open, incisional stone surgery. Approximate 50% extra time spent due to the large stone burden.

## 2014-06-26 NOTE — Progress Notes (Signed)
ANTIBIOTIC CONSULT NOTE - INITIAL  Pharmacy Consult for Gentamicin, antibiotic renal dose adjustment  Indication: UTI  Allergies  Allergen Reactions  . Lactulose     REACTION: unknown reaction  . Penicillins     REACTION: unknown reaction  . Prilosec [Omeprazole] Itching and Rash    Patient Measurements: Height: 5\' 1"  (154.9 cm) Weight: 160 lb (72.576 kg) IBW/kg (Calculated) : 47.8 Adjusted Body Weight: 57 kg  Vital Signs: Temp: 97.8 F (36.6 C) (01/14 1308) Temp Source: Oral (01/14 0519) BP: 133/84 mmHg (01/14 1308) Pulse Rate: 58 (01/14 1308)  Labs:  Recent Labs  06/23/14 2158 06/26/14 0540  WBC 6.6  --   HGB 11.4*  --   PLT 272  --   CREATININE 2.05* 1.91*   Estimated Creatinine Clearance: 17.5 mL/min (by C-G formula based on Cr of 1.91). No results for input(s): VANCOTROUGH, VANCOPEAK, VANCORANDOM, GENTTROUGH, GENTPEAK, GENTRANDOM, TOBRATROUGH, TOBRAPEAK, TOBRARND, AMIKACINPEAK, AMIKACINTROU, AMIKACIN in the last 72 hours.   Microbiology: No results found for this or any previous visit (from the past 720 hour(s)).  Medical History: Past Medical History  Diagnosis Date  . Hypothyroidism   . Shortness of breath   . Schatzki's ring   . Hypertension   . Vertebral compression fracture   . Arthritis     right knee  . Peripheral neuropathy   . Hiatal hernia     Assessment: [age] yoF s/p cystoscopy, b/l ureteroscopy, b/l stent removal, and b/l stone extraction. Pharmacy is consulted to dose Gentamicin.  1/14 >> Gentamicin >>  Today, 06/26/2014   Tmax: afebrile  WBCs: 6.6 (1/11)  Renal: SCr 1.91, CrCl ~ 17.5 ml/min  First dose of gentamicin, ~ 1.7 mg/kg given pre-op today.  Goal of Therapy:  Gentamicin trough level <2 mcg/ml  Plan:   Pharmacy to f/u renal function in AM.  Gentamicin 75 mg IV q24h, next dose tomorrow at 1000.  May need gentamicin dosing per gent levels if renal function remains poor.  Follow up cultures as  available.   Lynann Beaverhristine Mizael Sagar PharmD, BCPS Pager [phone] 06/26/2014 1:57 PM

## 2014-06-26 NOTE — Progress Notes (Addendum)
Per son [name] SiasBobby Belizaire, pt fell more than once at place she resides  Pt has ecchymotic areas right eye,rightt arm  Has torn skin area above right eye  Also with redness, open area toes right foot

## 2014-06-26 NOTE — Care Management Note (Addendum)
    Page 1 of 1   06/27/2014     3:22:07 PM CARE MANAGEMENT NOTE 06/27/2014  Patient:  [name],Aleisha E   Account Number:  [id number]  Date Initiated:  06/26/2014  Documentation initiated by:  Lanier ClamMAHABIR,Carleigh Buccieri  Subjective/Objective Assessment:   81 y/o f admitted w/r ureteral calculus.     Action/Plan:   From home.   Anticipated DC Date:  06/27/2014   Anticipated DC Plan:  SKILLED NURSING FACILITY      DC Planning Services  CM consult      Choice offered to / List presented to:             Status of service:  Completed, signed off Medicare Important Message given?   (If response is "NO", the following Medicare IM given date fields will be blank) Date Medicare IM given:   Medicare IM given by:   Date Additional Medicare IM given:   Additional Medicare IM given by:    Discharge Disposition:  SKILLED NURSING FACILITY  Per UR Regulation:  Reviewed for med. necessity/level of care/duration of stay  If discussed at Long Length of Stay Meetings, dates discussed:    Comments:  06/27/14 Lanier ClamKathy Deontray Hunnicutt RN BSN NCM [phone] d/c back snf.  06/26/14 Lanier ClamKathy Joelene Barriere RN BSN NCM [phone] s/p cystoscopy.May need HHC.Recommend PT/OT cons.Await recommendations.

## 2014-06-26 NOTE — Transfer of Care (Signed)
Immediate Anesthesia Transfer of Care Note  Patient: [name]  Procedure(s) Performed: Procedure(s) (LRB): CYSTOSCOPY,  BILATERAL URETEROSCOPY, BILATERAL STENT REMOVAL, BILATERAL STONE EXTRACTION WITH BASKET,   (Bilateral) HOLMIUM LASER APPLICATION (Bilateral)  Patient Location: PACU  Anesthesia Type: General  Level of Consciousness: sedated, patient cooperative and responds to stimulation  Airway & Oxygen Therapy: Patient Spontanous Breathing and Patient connected to face mask oxgen  Post-op Assessment: Report given to PACU RN and Post -op Vital signs reviewed and stable  Post vital signs: Reviewed and stable  Complications: No apparent anesthesia complications

## 2014-06-26 NOTE — Discharge Summary (Signed)
Patient ID: [name] MRN: [id number] DOB/AGE: 1921/06/13 81 y.o.  Admit date: 06/26/2014 Discharge date: 06/26/2014  Primary Care Physician:  Alice ReichertMCINNIS,ANGUS G, MD  Discharge Diagnoses:   Present on Admission:  . Right ureteral calculus  * Left ureteral stone  Consults:  None   Discharge Medications:   Medication List    STOP taking these medications        cefUROXime 250 MG tablet  Commonly known as:  CEFTIN     ciprofloxacin 500 MG tablet  Commonly known as:  CIPRO      TAKE these medications        gabapentin 600 MG tablet  Commonly known as:  NEURONTIN  Take 600 mg by mouth at bedtime.     ipratropium-albuterol 0.5-2.5 (3) MG/3ML Soln  Commonly known as:  DUONEB  Take 3 mLs by nebulization every 6 (six) hours as needed (Shortness of breath).     levothyroxine 100 MCG tablet  Commonly known as:  SYNTHROID, LEVOTHROID  Take 100 mcg by mouth daily before breakfast.     LORazepam 0.5 MG tablet  Commonly known as:  ATIVAN  Take 0.5 mg by mouth every 8 (eight) hours. Unsure of dosage     PAIN RELIEVER 325 MG tablet  Generic drug:  acetaminophen  Take 650 mg by mouth every 6 (six) hours as needed for mild pain or moderate pain.     trimethoprim 100 MG tablet  Commonly known as:  TRIMPEX  Take 1 tablet (100 mg total) by mouth at bedtime.     VENELEX Oint  Apply 1 application topically 3 (three) times daily. Applied to buttock         Significant Diagnostic Studies:  Ct Head Wo Contrast  06/24/2014   CLINICAL DATA:  Fall, head injury.  Hit right eye.  Weakness.  EXAM: CT HEAD WITHOUT CONTRAST  TECHNIQUE: Contiguous axial images were obtained from the base of the skull through the vertex without intravenous contrast.  COMPARISON:  03/11/2014  FINDINGS: Generalized atrophy and mild chronic small vessel ischemia, stable from prior. No intracranial hemorrhage, mass effect, or midline shift. No hydrocephalus. The basilar cisterns are patent. No evidence of  territorial infarct. No intracranial fluid collection. Calvarium is intact. Chronic opacification of right mid ethmoid air cell. Paranasal sinuses are otherwise well-aerated. Minimal skin thickening in the right perirectal soft tissues.  IMPRESSION: No acute intracranial abnormality. Stable atrophy and chronic small vessel ischemic change.   Electronically Signed   By: Rubye OaksMelanie  Ehinger M.D.   On: 06/24/2014 02:30   Dg Hip Unilat With Pelvis 2-3 Views Right  06/24/2014   CLINICAL DATA:  81 year old female with pain after fall.  EXAM: DG HIP W/ PELVIS 2-3V*R*  COMPARISON:  Radiographs 1 day prior.  FINDINGS: Cortical margins of the bony pelvis are intact. No acute fracture. Both femoral heads are well-seated in respective acetabula. Pubic symphysis and sacroiliac joints are congruent. Bilateral nephro ureteral stents of stones along the course of the distal left ureter stent, unchanged from prior.  IMPRESSION: No pelvic fracture.   Electronically Signed   By: Rubye OaksMelanie  Ehinger M.D.   On: 06/24/2014 02:14    Brief H and P: For complete details please refer to admission H and P, but in brief the patient is admiited for ureteroscopic management of large bilateral ureteral calculi. She has bilateral stents placed whwen she presented with bilateral obstructing stones and urosepsis.  Hospital Course:  Active Problems:   Right ureteral calculus  Day of Discharge BP 133/84 mmHg  Pulse 58  Temp(Src) 97.8 F (36.6 C) (Oral)  Resp 16  Ht  (1.549 m)  Wt 72.576 kg (160 lb)  BMI 30.25 kg/m2  SpO2 100%  Results for orders placed or performed during the hospital encounter of 06/26/14 (from the past 24 hour(s))  Basic metabolic panel     Status: Abnormal   Collection Time: 06/26/14  5:40 AM  Result Value Ref Range   Sodium 135 135 - 145 mmol/L   Potassium 5.5 (H) 3.5 - 5.1 mmol/L   Chloride 107 96 - 112 mEq/L   CO2 22 19 - 32 mmol/L   Glucose, Bld 102 (H) 70 - 99 mg/dL   BUN 49 (H) 6 - 23 mg/dL    Creatinine, Ser 9.60 (H) 0.50 - 1.10 mg/dL   Calcium 8.8 8.4 - 45.4 mg/dL   GFR calc non Af Amer 22 (L) >90 mL/min   GFR calc Af Amer 25 (L) >90 mL/min   Anion gap 6 5 - 15  CBC     Status: Abnormal   Collection Time: 06/26/14  1:47 PM  Result Value Ref Range   WBC 7.6 4.0 - 10.5 K/uL   RBC 3.90 3.87 - 5.11 MIL/uL   Hemoglobin 11.0 (L) 12.0 - 15.0 g/dL   HCT 09.8 11.9 - 14.7 %   MCV 92.3 78.0 - 100.0 fL   MCH 28.2 26.0 - 34.0 pg   MCHC 30.6 30.0 - 36.0 g/dL   RDW 82.9 56.2 - 13.0 %   Platelets 256 150 - 400 K/uL    Physical Exam: General: Alert and awake oriented x3 not in any acute distress. HEENT: anicteric sclera, pupils reactive to light and accommodation CVS: S1-S2 clear no murmur rubs or gallops Chest: clear to auscultation bilaterally, no wheezing rales or rhonchi Abdomen: soft nontender, nondistended, normal bowel sounds, no organomegaly Extremities: no cyanosis, clubbing or edema noted bilaterally Neuro: Cranial nerves II-XII intact, no focal neurological deficits  Disposition:  SNF--[facility]  Diet:  No restrictions  Activity:  Continued PT/OT   Disposition and Follow-up:    We will arrange followup  TESTS THAT NEED FOLLOW-UP  Check urine culture  DISCHARGE FOLLOW-UP     Follow-up Information    Follow up with Chelsea Aus, MD.   Specialty:  Urology   Why:  As scheduled   Contact information:   [address] [place] [address] [phone]       Time spent on Discharge:  15 mins  Signed: Chelsea Aus 06/26/2014, 6:18 PM

## 2014-06-26 NOTE — Progress Notes (Signed)
Clinical Social Work Department BRIEF PSYCHOSOCIAL ASSESSMENT 06/26/2014  Patient:  [name],[name]     Account Number:  [id number]     Admit date:  06/26/2014  Clinical Social Worker:  Hampton AbbotGRANT,Blase Beckner, CLINICAL SOCIAL WORKER  Date/Time:  06/26/2014 02:18 PM  Referred by:  Physician  Date Referred:  06/26/2014 Referred for  SNF Placement   Other Referral:   Interview type:  Patient Other interview type:   and some family at bedside.    PSYCHOSOCIAL DATA Living Status:  FACILITY [facility] Level of care:  Skilled Nursing Facility Primary support name:  Tedd SiasBobby Vieyra Primary support relationship to patient:  CHILD, ADULT Degree of support available:   Adequate    CURRENT CONCERNS Current Concerns  Post-Acute Placement   Other Concerns:    SOCIAL WORK ASSESSMENT / PLAN CSW faxed over updated FL2 and other needed clinicals to SNF.   Assessment/plan status:  Psychosocial Support/Ongoing Assessment of Needs Other assessment/ plan:   Information/referral to community resources:   CS[facility] to [facility] to make sure patient is able to return when medically stable, facility agreeable to accept patient at DC.    PATIENT'S/FAMILY'S RESPONSE TO PLAN OF CARE: CSW spoke with patient and some family who was at bedside. CSW introduced self and role. Patient confirmed of being at [facility] before being admitted to the hospital. Patient plans to return to SN[facility] when medically stable and ready for DC. CSW informed patient and family that [facility] has been notified of plans and agreeable to accept patient back when DC. CSW will continue to follow.       Hampton AbbotKadijah Laiah Pouncey BSW Intern

## 2014-06-26 NOTE — Anesthesia Postprocedure Evaluation (Signed)
  Anesthesia Post-op Note  Patient: [name]  Procedure(s) Performed: Procedure(s) (LRB): CYSTOSCOPY,  BILATERAL URETEROSCOPY, BILATERAL STENT REMOVAL, BILATERAL STONE EXTRACTION WITH BASKET,   (Bilateral) HOLMIUM LASER APPLICATION (Bilateral)  Patient Location: PACU  Anesthesia Type: General  Level of Consciousness: awake and alert   Airway and Oxygen Therapy: Patient Spontanous Breathing  Post-op Pain: mild  Post-op Assessment: Post-op Vital signs reviewed, Patient's Cardiovascular Status Stable, Respiratory Function Stable, Patent Airway and No signs of Nausea or vomiting  Last Vitals:  Filed Vitals:   06/26/14 1215  BP: 117/49  Pulse: 54  Temp:   Resp: 13    Post-op Vital Signs: stable   Complications: No apparent anesthesia complications

## 2014-06-26 NOTE — Anesthesia Procedure Notes (Signed)
Procedure Name: LMA Insertion Date/Time: 06/26/2014 8:54 AM Performed by: Epimenio SarinJARVELA, Ramez Arrona R Pre-anesthesia Checklist: Patient identified, Emergency Drugs available, Suction available, Patient being monitored and Timeout performed Patient Re-evaluated:Patient Re-evaluated prior to inductionOxygen Delivery Method: Circle system utilized Preoxygenation: Pre-oxygenation with 100% oxygen Intubation Type: IV induction Ventilation: Mask ventilation without difficulty LMA: LMA with gastric port inserted LMA Size: 3.0 Number of attempts: 1 Dental Injury: Teeth and Oropharynx as per pre-operative assessment

## 2014-06-27 ENCOUNTER — Encounter (HOSPITAL_COMMUNITY): Payer: Self-pay | Admitting: Urology

## 2014-06-27 DIAGNOSIS — N201 Calculus of ureter: Secondary | ICD-10-CM | POA: Diagnosis not present

## 2014-06-27 LAB — BASIC METABOLIC PANEL
ANION GAP: 7 (ref 5–15)
BUN: 44 mg/dL — ABNORMAL HIGH (ref 6–23)
CALCIUM: 8.6 mg/dL (ref 8.4–10.5)
CO2: 23 mmol/L (ref 19–32)
CREATININE: 1.86 mg/dL — AB (ref 0.50–1.10)
Chloride: 109 mEq/L (ref 96–112)
GFR calc Af Amer: 26 mL/min — ABNORMAL LOW (ref >90)
GFR, EST NON AFRICAN AMERICAN: 23 mL/min — AB (ref >90)
GLUCOSE: 97 mg/dL (ref 70–99)
Potassium: 5.4 mmol/L — ABNORMAL HIGH (ref 3.5–5.1)
SODIUM: 139 mmol/L (ref 135–145)

## 2014-06-27 LAB — URINE CULTURE: Colony Count: 35000

## 2014-06-27 LAB — GENTAMICIN LEVEL, TROUGH: GENTAMICIN TR: 1.4 ug/mL (ref 0.5–2.0)

## 2014-06-27 NOTE — Progress Notes (Signed)
ANTIBIOTIC CONSULT NOTE - INITIAL  Pharmacy Consult for Gentamicin, antibiotic renal dose adjustment  Indication: UTI  Allergies  Allergen Reactions  . Lactulose     REACTION: unknown reaction  . Penicillins     REACTION: unknown reaction  . Prilosec [Omeprazole] Itching and Rash    Patient Measurements: Height: 5\' 1"  (154.9 cm) Weight: 160 lb (72.576 kg) IBW/kg (Calculated) : 47.8 Adjusted Body Weight: 57 kg  Vital Signs: Temp: 98.5 F (36.9 C) (01/15 0426) Temp Source: Axillary (01/15 0426) BP: 112/46 mmHg (01/15 0426) Pulse Rate: 63 (01/15 0426)  Labs:  Recent Labs  06/26/14 0540 06/26/14 1347 06/27/14 0840  WBC  --  7.6  --   HGB  --  11.0*  --   PLT  --  256  --   CREATININE 1.91*  --  1.86*   Estimated Creatinine Clearance: 17.9 mL/min (by C-G formula based on Cr of 1.86).  Recent Labs  06/27/14 1037  GENTTROUGH 1.4     Microbiology: Recent Results (from the past 720 hour(s))  Urine culture     Status: None   Collection Time: 06/26/14  9:05 AM  Result Value Ref Range Status   Specimen Description URINE, CATHETERIZED  Final   Special Requests NONE  Final   Colony Count   Final    35,000 COLONIES/ML Performed at [facility]    Culture YEAST Performed at [facility]   Final   Report Status 06/27/2014 FINAL  Final    Medical History: Past Medical History  Diagnosis Date  . Hypothyroidism   . Shortness of breath   . Schatzki's ring   . Hypertension   . Vertebral compression fracture   . Arthritis     right knee  . Peripheral neuropathy   . Hiatal hernia     Assessment: [age] yoF s/p cystoscopy, b/l ureteroscopy, b/l stent removal, and b/l stone extraction. Pharmacy is consulted to dose Gentamicin.  1/14 >> Gentamicin >>  Today, 06/27/2014   Tmax: afebrile  WBCs: 7.6  Renal: SCr 1.89, CrCl ~ 17.9 ml/min  Gentamicin level = 1.4  Goal of Therapy:  Gentamicin trough level <2 mcg/ml  Plan:   Patient scheduled  for discharge today without further abx orders.  Per MD, no further gentamicin doses needed.  MD to follow up cultures as available outpatient.   Lynann Beaverhristine Nathalia Wismer PharmD, BCPS Pager [phone] 06/27/2014 1:11 PM

## 2014-06-27 NOTE — Progress Notes (Signed)
UR completed 

## 2014-06-27 NOTE — Progress Notes (Signed)
Patient is set to discharge back [facility] today. Patient & son, Reita ClicheBobby aware. Discharge packet given to RN, Dois DavenportSandra. [facility] called for transport.     Lincoln MaxinKelly Simran Bomkamp, LCSW [facility] Clinical Social Worker cell #: [phone]

## 2014-07-03 ENCOUNTER — Inpatient Hospital Stay (HOSPITAL_COMMUNITY)
Admission: EM | Admit: 2014-07-03 | Discharge: 2014-07-07 | DRG: 470 | Disposition: A | Discharge status: Skilled Nursing Facility | Payer: Medicare Other | Attending: Family Medicine | Admitting: Family Medicine

## 2014-07-03 ENCOUNTER — Encounter (HOSPITAL_COMMUNITY): Payer: Self-pay

## 2014-07-03 ENCOUNTER — Emergency Department (HOSPITAL_COMMUNITY): Payer: Medicare Other

## 2014-07-03 DIAGNOSIS — E860 Dehydration: Secondary | ICD-10-CM | POA: Diagnosis present

## 2014-07-03 DIAGNOSIS — W050XXA Fall from non-moving wheelchair, initial encounter: Secondary | ICD-10-CM | POA: Diagnosis present

## 2014-07-03 DIAGNOSIS — Z87440 Personal history of urinary (tract) infections: Secondary | ICD-10-CM

## 2014-07-03 DIAGNOSIS — N179 Acute kidney failure, unspecified: Secondary | ICD-10-CM | POA: Diagnosis present

## 2014-07-03 DIAGNOSIS — W19XXXA Unspecified fall, initial encounter: Secondary | ICD-10-CM

## 2014-07-03 DIAGNOSIS — M25551 Pain in right hip: Secondary | ICD-10-CM | POA: Diagnosis present

## 2014-07-03 DIAGNOSIS — S72001A Fracture of unspecified part of neck of right femur, initial encounter for closed fracture: Secondary | ICD-10-CM | POA: Diagnosis present

## 2014-07-03 DIAGNOSIS — Z90710 Acquired absence of both cervix and uterus: Secondary | ICD-10-CM | POA: Diagnosis not present

## 2014-07-03 DIAGNOSIS — Y92129 Unspecified place in nursing home as the place of occurrence of the external cause: Secondary | ICD-10-CM

## 2014-07-03 DIAGNOSIS — Z8249 Family history of ischemic heart disease and other diseases of the circulatory system: Secondary | ICD-10-CM

## 2014-07-03 DIAGNOSIS — E039 Hypothyroidism, unspecified: Secondary | ICD-10-CM | POA: Diagnosis present

## 2014-07-03 DIAGNOSIS — S72009A Fracture of unspecified part of neck of unspecified femur, initial encounter for closed fracture: Secondary | ICD-10-CM | POA: Diagnosis present

## 2014-07-03 DIAGNOSIS — F0390 Unspecified dementia without behavioral disturbance: Secondary | ICD-10-CM | POA: Diagnosis present

## 2014-07-03 DIAGNOSIS — I10 Essential (primary) hypertension: Secondary | ICD-10-CM | POA: Diagnosis present

## 2014-07-03 DIAGNOSIS — Z66 Do not resuscitate: Secondary | ICD-10-CM | POA: Diagnosis present

## 2014-07-03 DIAGNOSIS — Z09 Encounter for follow-up examination after completed treatment for conditions other than malignant neoplasm: Secondary | ICD-10-CM

## 2014-07-03 DIAGNOSIS — N390 Urinary tract infection, site not specified: Secondary | ICD-10-CM | POA: Diagnosis present

## 2014-07-03 DIAGNOSIS — R52 Pain, unspecified: Secondary | ICD-10-CM

## 2014-07-03 DIAGNOSIS — S72002A Fracture of unspecified part of neck of left femur, initial encounter for closed fracture: Secondary | ICD-10-CM

## 2014-07-03 DIAGNOSIS — Z87442 Personal history of urinary calculi: Secondary | ICD-10-CM | POA: Diagnosis not present

## 2014-07-03 LAB — COMPREHENSIVE METABOLIC PANEL
ALK PHOS: 80 U/L (ref 39–117)
ALT: 13 U/L (ref 0–35)
ALT: 13 U/L (ref 0–35)
AST: 16 U/L (ref 0–37)
AST: 15 U/L (ref 0–37)
Albumin: 3.9 g/dL (ref 3.5–5.2)
Albumin: 3.7 g/dL (ref 3.5–5.2)
Alkaline Phosphatase: 65 U/L (ref 39–117)
Anion gap: 6 (ref 5–15)
Anion gap: 6 (ref 5–15)
BUN: 45 mg/dL — ABNORMAL HIGH (ref 6–23)
BUN: 44 mg/dL — ABNORMAL HIGH (ref 6–23)
CO2: 22 mmol/L (ref 19–32)
CO2: 23 mmol/L (ref 19–32)
Calcium: 8.6 mg/dL (ref 8.4–10.5)
Calcium: 8.9 mg/dL (ref 8.4–10.5)
Chloride: 106 mEq/L (ref 96–112)
Chloride: 110 mEq/L (ref 96–112)
Creatinine, Ser: 2.23 mg/dL — ABNORMAL HIGH (ref 0.50–1.10)
Creatinine, Ser: 2.19 mg/dL — ABNORMAL HIGH (ref 0.50–1.10)
GFR calc Af Amer: 21 mL/min — ABNORMAL LOW (ref >90)
GFR calc Af Amer: 21 mL/min — ABNORMAL LOW (ref >90)
GFR calc non Af Amer: 18 mL/min — ABNORMAL LOW (ref >90)
GFR calc non Af Amer: 19 mL/min — ABNORMAL LOW (ref >90)
Glucose, Bld: 146 mg/dL — ABNORMAL HIGH (ref 70–99)
Glucose, Bld: 126 mg/dL — ABNORMAL HIGH (ref 70–99)
POTASSIUM: 4.9 mmol/L (ref 3.5–5.1)
Potassium: 5.1 mmol/L (ref 3.5–5.1)
Sodium: 134 mmol/L — ABNORMAL LOW (ref 135–145)
Sodium: 139 mmol/L (ref 135–145)
TOTAL PROTEIN: 7 g/dL (ref 6.0–8.3)
Total Bilirubin: 0.4 mg/dL (ref 0.3–1.2)
Total Bilirubin: 0.3 mg/dL (ref 0.3–1.2)
Total Protein: 6.7 g/dL (ref 6.0–8.3)

## 2014-07-03 LAB — CBC WITH DIFFERENTIAL/PLATELET
Basophils Absolute: 0.1 10*3/uL (ref 0.0–0.1)
Basophils Absolute: 0.1 10*3/uL (ref 0.0–0.1)
Basophils Relative: 1 % (ref 0–1)
Basophils Relative: 0 % (ref 0–1)
Eosinophils Absolute: 0.5 10*3/uL (ref 0.0–0.7)
Eosinophils Absolute: 0.4 10*3/uL (ref 0.0–0.7)
Eosinophils Relative: 6 % — ABNORMAL HIGH (ref 0–5)
Eosinophils Relative: 3 % (ref 0–5)
HCT: 33.3 % — ABNORMAL LOW (ref 36.0–46.0)
HCT: 32 % — ABNORMAL LOW (ref 36.0–46.0)
Hemoglobin: 10.6 g/dL — ABNORMAL LOW (ref 12.0–15.0)
Hemoglobin: 10.4 g/dL — ABNORMAL LOW (ref 12.0–15.0)
Lymphocytes Relative: 22 % (ref 12–46)
Lymphocytes Relative: 19 % (ref 12–46)
Lymphs Abs: 1.9 10*3/uL (ref 0.7–4.0)
Lymphs Abs: 2.1 10*3/uL (ref 0.7–4.0)
MCH: 28.6 pg (ref 26.0–34.0)
MCH: 29.1 pg (ref 26.0–34.0)
MCHC: 31.8 g/dL (ref 30.0–36.0)
MCHC: 32.5 g/dL (ref 30.0–36.0)
MCV: 89.8 fL (ref 78.0–100.0)
MCV: 89.6 fL (ref 78.0–100.0)
Monocytes Absolute: 0.6 10*3/uL (ref 0.1–1.0)
Monocytes Absolute: 0.7 10*3/uL (ref 0.1–1.0)
Monocytes Relative: 6 % (ref 3–12)
Monocytes Relative: 7 % (ref 3–12)
Neutro Abs: 5.9 10*3/uL (ref 1.7–7.7)
Neutro Abs: 8 10*3/uL — ABNORMAL HIGH (ref 1.7–7.7)
Neutrophils Relative %: 65 % (ref 43–77)
Neutrophils Relative %: 71 % (ref 43–77)
Platelets: 275 10*3/uL (ref 150–400)
Platelets: 262 10*3/uL (ref 150–400)
RBC: 3.71 MIL/uL — ABNORMAL LOW (ref 3.87–5.11)
RBC: 3.57 MIL/uL — ABNORMAL LOW (ref 3.87–5.11)
RDW: 14.4 % (ref 11.5–15.5)
RDW: 14.5 % (ref 11.5–15.5)
WBC: 9 10*3/uL (ref 4.0–10.5)
WBC: 11.3 10*3/uL — ABNORMAL HIGH (ref 4.0–10.5)

## 2014-07-03 LAB — SAMPLE TO BLOOD BANK

## 2014-07-03 LAB — URINALYSIS, ROUTINE W REFLEX MICROSCOPIC
Bilirubin Urine: NEGATIVE
Glucose, UA: NEGATIVE mg/dL
Ketones, ur: NEGATIVE mg/dL
Nitrite: POSITIVE — AB
Protein, ur: 100 mg/dL — AB
Specific Gravity, Urine: 1.02 (ref 1.005–1.030)
Urobilinogen, UA: 0.2 mg/dL (ref 0.0–1.0)
pH: 7 (ref 5.0–8.0)

## 2014-07-03 LAB — APTT: aPTT: 31 seconds (ref 24–37)

## 2014-07-03 LAB — URINE MICROSCOPIC-ADD ON

## 2014-07-03 LAB — PROTIME-INR
INR: 0.97 (ref 0.00–1.49)
Prothrombin Time: 13 seconds (ref 11.6–15.2)

## 2014-07-03 LAB — ABO/RH: ABO/RH(D): O NEG

## 2014-07-03 MED ORDER — FENTANYL CITRATE 0.05 MG/ML IJ SOLN
25.0000 ug | Freq: Once | INTRAMUSCULAR | Status: AC
  Administered 2014-07-03: 25 ug via NASAL
  Filled 2014-07-03: qty 2

## 2014-07-03 MED ORDER — HEPARIN SODIUM (PORCINE) 5000 UNIT/ML IJ SOLN
5000.0000 [IU] | Freq: Three times a day (TID) | INTRAMUSCULAR | Status: DC
  Administered 2014-07-03 (×2): 5000 [IU] via SUBCUTANEOUS
  Filled 2014-07-03 (×2): qty 1

## 2014-07-03 MED ORDER — IPRATROPIUM-ALBUTEROL 0.5-2.5 (3) MG/3ML IN SOLN: 3.0000 mL | Freq: Four times a day (QID) | RESPIRATORY_TRACT | Status: DC | PRN

## 2014-07-03 MED ORDER — SODIUM CHLORIDE 0.9 % IV SOLN
INTRAVENOUS | Status: DC
  Administered 2014-07-03: 03:00:00 via INTRAVENOUS

## 2014-07-03 MED ORDER — HYDROMORPHONE HCL 1 MG/ML IJ SOLN
0.5000 mg | INTRAMUSCULAR | Status: DC | PRN
  Administered 2014-07-03 (×2): 0.5 mg via INTRAVENOUS
  Administered 2014-07-03: 1 mg via INTRAVENOUS
  Administered 2014-07-04 – 2014-07-06 (×3): 0.5 mg via INTRAVENOUS
  Filled 2014-07-03 (×7): qty 1

## 2014-07-03 MED ORDER — SODIUM CHLORIDE 0.9 % IV SOLN
Freq: Once | INTRAVENOUS | Status: AC
  Administered 2014-07-03: 13:00:00 via INTRAVENOUS

## 2014-07-03 MED ORDER — GABAPENTIN 300 MG PO CAPS
600.0000 mg | ORAL_CAPSULE | Freq: Every day | ORAL | Status: DC
  Administered 2014-07-03 – 2014-07-06 (×4): 600 mg via ORAL
  Filled 2014-07-03 (×4): qty 2

## 2014-07-03 MED ORDER — CHLORHEXIDINE GLUCONATE 4 % EX LIQD
60.0000 mL | Freq: Once | CUTANEOUS | Status: DC
  Filled 2014-07-03: qty 15

## 2014-07-03 MED ORDER — DEXTROSE 5 % IV SOLN
1.0000 g | INTRAVENOUS | Status: DC
  Administered 2014-07-03 – 2014-07-07 (×5): 1 g via INTRAVENOUS
  Filled 2014-07-03 (×7): qty 10

## 2014-07-03 MED ORDER — LEVOTHYROXINE SODIUM 100 MCG PO TABS
100.0000 ug | ORAL_TABLET | Freq: Every day | ORAL | Status: DC
  Administered 2014-07-05 – 2014-07-07 (×3): 100 ug via ORAL
  Filled 2014-07-03 (×4): qty 1

## 2014-07-03 MED ORDER — CLINDAMYCIN PHOSPHATE 900 MG/50ML IV SOLN
900.0000 mg | INTRAVENOUS | Status: AC
  Administered 2014-07-04: 900 mg via INTRAVENOUS
  Filled 2014-07-03 (×2): qty 50

## 2014-07-03 MED ORDER — SODIUM CHLORIDE 0.9 % IV SOLN
INTRAVENOUS | Status: DC
  Administered 2014-07-03 – 2014-07-05 (×3): via INTRAVENOUS

## 2014-07-03 NOTE — H&P (Signed)
Hospitalist Admission History and Physical  Patient name: [name] Medical record number: [id number] Date of birth: [birth year] Age: 81 y.o. Gender: female  Primary Care Provider: [name], MD  Chief Complaint: hip fracture, mechanical fall, UTI  History of Present Illness:This is a 81 y.o. year old female with significant past medical history of dementia, ureteral calculi s/p lithotripsy and stent 06/26/14, hypothyroidism, HTN presenting with hip fracture, mechanical fall, UTI. Level V caveat as pt is confused. Son at bedside. Pt resident of local SNF. [name] received call from SNF that pt fell, landing on her R hip. Noted to have been admitted 06/26/14 for bilateral ureteral stones w/ lithothripsy and ureteral stent placement. On trimpex outpt. No reports of fever, nausea, vomiting.  Presented to ER afebrile, hemodynamically stable. BP 120s-140s/50s-110s in setting of pain. CBC and BMET grossly WNL. Cr 2.27. L hip xray w/ impacted L femoral neck fracture. CXR WNL. Knee xray w/ degenerative changes on small effusion. UA indicative of infection. Noted urine culture  35k colony and yeast.  EDP (I Knapp) discussed case w/ ortho at [facility]). Would like for pt to be admitted   Assessment and Plan: [name] is a 81 y.o. year old female presenting with Hip fracture   Active Problems:   Hip fracture   1- Hip Fracture -pain control -f/u ortho recs   2- UTI  -s/p recent lithotripsy and ureteral stent placement  -on trimpex currently  -UA indicative of infection w/ noted yeast on recent urine cx -IV rocephin  -fluconazole  -repeat urine cx  -follow   3-AKI -Cr near baseline from recent admission -dry on exam -hydrate -follow  FEN/GI: NPO  Prophylaxis: sub q heparin  Disposition: pending further evaluation  Code Status:Full Code    Patient Active Problem List   Diagnosis Date Noted  . Hip fracture 07/03/2014  . Right ureteral calculus 06/26/2014  . Acute renal  failure 05/20/2014  . Ureteral stone with hydronephrosis 05/20/2014  . Hyperglycemia 05/20/2014  . Renal atrophy 05/20/2014  . Leukocytosis 05/20/2014  . Urinary tract infection with pyuria 05/20/2014  . Hypertension   . UTI (urinary tract infection) 07/17/2013  . UTI (lower urinary tract infection) 11/09/2012  . Fall 11/09/2012  . Delirium superimposed on dementia 11/09/2012  . Dysphagia 06/19/2012  . Esophageal dysphagia 09/06/2011  . Odynophagia 09/06/2011  . Hoarseness 09/06/2011  . Abdominal pain, generalized 10/21/2008  . Hypothyroidism 10/10/2008  . Personal history of other diseases of digestive disease 10/10/2008   Past Medical History: Past Medical History  Diagnosis Date  . Hypothyroidism   . Shortness of breath   . Schatzki's ring   . Hypertension   . Vertebral compression fracture   . Arthritis     right knee  . Peripheral neuropathy   . Hiatal hernia     Past Surgical History: Past Surgical History  Procedure Laterality Date  . Esophagogastroduodenoscopy  10/24/08    normal, status post [facility] dilator, small hiatal hernia deformity of the antrum/pylorus  . Colonoscopy  10/24/08    anal papilla otherwise normal, pan colonic diverticulum and colonic mucosa  . Appendectomy  1941  . Abdominal hysterectomy  1977  . Cholecystectomy  1967  . Spine surgery  1986  . Knee surgery  09/2008  . Hemorrhoid surgery  1999  . Colonoscopy  remote    Dr. Jerolyn Shin Smith-->polpys per patient  . Ercp  1995    Dr. Rourk--> for dilated biliary tree. no stone or neoplasm  or stricture found  . Esophagogastroduodenoscopy  1995    Dr. Rourk--> empiric dilation of esophagus with resolution of dysphagia  . Esophagogastroduodenoscopy   09/21/2011    RMR: Noncritical Schatzki's ring s/p dilated/Abnormal esophageal mucosa/Small  hiatal hermia  . Shoulder surgery    . Cystoscopy with stent placement Bilateral 05/20/2014    Procedure: CYSTOSCOPY WITH BILATERAL DOUBLE J STENT PLACEMENT;   Surgeon: Chelsea Aus, MD;  Location: [facility];  Service: Urology;  Laterality: Bilateral;  . Holmium laser application Bilateral 06/26/2014    Procedure: HOLMIUM LASER APPLICATION;  Surgeon: Chelsea Aus, MD;  Location: [facility];  Service: Urology;  Laterality: Bilateral;    Social History: History   Social History  . Marital Status: Widowed    Spouse Name: N/A    Number of Children: 3  . Years of Education: N/A   Occupational History  . Retired Neurosurgeon    Social History Main Topics  . Smoking status: Never Smoker   . Smokeless tobacco: None  . Alcohol Use: No  . Drug Use: No  . Sexual Activity: None   Other Topics Concern  . None   Social History Narrative    Family History: Family History  Problem Relation Age of Onset  . Heart attack Father 27    deceased    Allergies: Allergies  Allergen Reactions  . Lactulose     REACTION: unknown reaction  . Penicillins     REACTION: unknown reaction  . Prilosec [Omeprazole] Itching and Rash    Current Facility-Administered Medications  Medication Dose Route Frequency Provider Last Rate Last Dose  . 0.9 %  sodium chloride infusion   Intravenous Continuous Ward Givens, MD      . 0.9 %  sodium chloride infusion   Intravenous Continuous Doree Albee, MD      . cefTRIAXone (ROCEPHIN) 1 g in dextrose 5 % 50 mL IVPB  1 g Intravenous Q24H Doree Albee, MD      . gabapentin (NEURONTIN) capsule 600 mg  600 mg Oral QHS Doree Albee, MD      . heparin injection 5,000 Units  5,000 Units Subcutaneous 3 times per day Doree Albee, MD      . HYDROmorphone (DILAUDID) injection 0.5-1 mg  0.5-1 mg Intravenous Q2H PRN Doree Albee, MD      . ipratropium-albuterol (DUONEB) 0.5-2.5 (3) MG/3ML nebulizer solution 3 mL  3 mL Nebulization Q6H PRN Doree Albee, MD      . levothyroxine (SYNTHROID, LEVOTHROID) tablet 100 mcg  100 mcg Oral QAC breakfast Doree Albee, MD       Current Outpatient Prescriptions  Medication Sig  Dispense Refill  . acetaminophen (PAIN RELIEVER) 325 MG tablet Take 650 mg by mouth every 6 (six) hours as needed for mild pain or moderate pain.    Lucilla Lame [place]-Castor Oil (VENELEX) OINT Apply 1 application topically 3 (three) times daily. Applied to buttock    . gabapentin (NEURONTIN) 600 MG tablet Take 600 mg by mouth at bedtime.    Marland Kitchen ipratropium-albuterol (DUONEB) 0.5-2.5 (3) MG/3ML SOLN Take 3 mLs by nebulization every 6 (six) hours as needed (Shortness of breath).    Marland Kitchen levothyroxine (SYNTHROID, LEVOTHROID) 100 MCG tablet Take 100 mcg by mouth daily before breakfast.    . LORazepam (ATIVAN) 0.5 MG tablet Take 0.5 mg by mouth every 8 (eight) hours. Unsure of dosage    . trimethoprim (TRIMPEX) 100 MG tablet Take 1 tablet (100 mg total) by mouth at bedtime. 7  tablet 0   Review Of Systems: 12 point ROS negative except as noted above in HPI.  Physical Exam: Filed Vitals:   07/03/14 0412  BP: 147/58  Pulse: 63  Temp:   Resp: 18    General: confused, mildly agitated  HEENT: PERRLA, extra ocular movement intact and dry oral mucosa Heart: S1, S2 normal, no murmur, rub or gallop, regular rate and rhythm Lungs: clear to auscultation, no wheezes or rales and unlabored breathing Abdomen: abdomen is soft without significant tenderness, masses, organomegaly or guarding Extremities: mild R hip TTP, distal pulses intact  Skin:no rashes Neurology: minimally cooperative to exam   Labs and Imaging: Lab Results  Component Value Date/Time   NA 134* 07/03/2014 03:02 AM   K 4.9 07/03/2014 03:02 AM   CL 106 07/03/2014 03:02 AM   CO2 22 07/03/2014 03:02 AM   BUN 45* 07/03/2014 03:02 AM   CREATININE 2.23* 07/03/2014 03:02 AM   GLUCOSE 146* 07/03/2014 03:02 AM   Lab Results  Component Value Date   WBC 9.0 07/03/2014   HGB 10.6* 07/03/2014   HCT 33.3* 07/03/2014   MCV 89.8 07/03/2014   PLT 275 07/03/2014   Urinalysis    Component Value Date/Time   COLORURINE ORANGE* 07/03/2014 0315    APPEARANCEUR HAZY* 07/03/2014 0315   LABSPEC 1.020 07/03/2014 0315   PHURINE 7.0 07/03/2014 0315   GLUCOSEU NEGATIVE 07/03/2014 0315   HGBUR LARGE* 07/03/2014 0315   BILIRUBINUR NEGATIVE 07/03/2014 0315   KETONESUR NEGATIVE 07/03/2014 0315   PROTEINUR 100* 07/03/2014 0315   UROBILINOGEN 0.2 07/03/2014 0315   NITRITE POSITIVE* 07/03/2014 0315   LEUKOCYTESUR LARGE* 07/03/2014 0315       Dg Chest 1 View  07/03/2014   CLINICAL DATA:  Fall at nursing  EXAM: CHEST - 1 VIEW  COMPARISON:  05/24/2014  FINDINGS: There is mild cardiomegaly which is stable from prior. The right hilum is prominent, as seen on previous studies, likely from mediastinal rotation. No mass is seen on abdominal CT 05/20/2014 which encompasses much of the right hilum. There is aortic tortuosity which is unchanged. There is no edema, consolidation, effusion, or pneumothorax. Chronic mild elevation of the left diaphragm with overlying atelectasis or scar. No appreciable fracture.  IMPRESSION: No active disease.   Electronically Signed   By: Tiburcio PeaJonathan  Watts M.D.   On: 07/03/2014 02:22   Dg Knee Complete 4 Views Right  07/03/2014   CLINICAL DATA:  Fall at nursing home.  Initial encounter.  EXAM: RIGHT KNEE - COMPLETE 4+ VIEW  COMPARISON:  01/25/2014  FINDINGS: There is a moderate knee joint effusion, stable to mildly increased from prior. No acute fracture or malalignment is detected. There is tricompartmental osteoarthritis with joint narrowing advanced in the medial compartment. There may be intra-articular debris in the suprapatellar joint fluid.  IMPRESSION: 1. No acute osseous findings. 2. Tricompartmental osteoarthritis and chronic knee joint effusion.   Electronically Signed   By: Tiburcio PeaJonathan  Watts M.D.   On: 07/03/2014 02:20   Dg Hip Unilat With Pelvis 2-3 Views Right  07/03/2014   CLINICAL DATA:  Fall at nursing home with hip pain. Initial encounter  EXAM: DG HIP W/ PELVIS 2-3V*R*  COMPARISON:  06/24/2014  FINDINGS: There is an  acute right femoral neck fracture with varus angulation. The right hip remains located. No significant hip degeneration.  The pelvic ring is intact.  The left hip is located and intact.  IMPRESSION: Impacted right femoral neck fracture.   Electronically Signed   By:  Tiburcio Pea M.D.   On: 07/03/2014 02:18           Doree Albee MD  Pager: [phone]

## 2014-07-03 NOTE — ED Notes (Signed)
MD at bedside. 

## 2014-07-03 NOTE — Clinical Social Work Psychosocial (Signed)
Clinical Social Work Department BRIEF PSYCHOSOCIAL ASSESSMENT 07/03/2014  Patient:  [name], [name]     Account Number:  [id number]     Admit date:  07/03/2014  Clinical Social Worker:  Wyatt Haste  Date/Time:  07/03/2014 10:12 AM  Referred by:  CSW  Date Referred:  07/03/2014 Referred for  SNF Placement   Other Referral:   Interview type:  Family Other interview type:   son- [name]    PSYCHOSOCIAL DATA Living Status:  FACILITY Admitted from facility:  [facility] Level of care:  [place] Primary support name:  [name] Primary support relationship to patient:  CHILD, ADULT Degree of support available:   supportive    CURRENT CONCERNS Current Concerns  Post-Acute Placement   Other Concerns:    SOCIAL WORK ASSESSMENT / PLAN CSW met with pt's sons and daughter-in-law at bedside. Pt sleeping during assessment. [name], [name] is HCPOA. Family appears to be very involved and supportive. He reports pt has been a resident at [facility] for the past month. Pt was making some progress and was able to ambulate short distances with PT. Outside of therapy, pt was primarily wheelchair bound. Yesterday, pt fell out of wheelchair, fracturing her hip. She is scheduled for surgery tomorrow. Family report they have been through this with other family members and know what to expect. CSW provided brief support. They request for pt to return to [facility] at d/c. Per Tami at [facility], family is not holding bed, but facility is agreeable to return pending bed availability.   Assessment/plan status:  Psychosocial Support/Ongoing Assessment of Needs Other assessment/ plan:   Information/referral to community resources:   [facility]    PATIENT'S/FAMILY'S RESPONSE TO PLAN OF CARE: Pt sleeping during assessment. Family report positive feelings regarding return to [facility] when medically stable. CSW will continue to follow.       Benay Pike, [place]

## 2014-07-03 NOTE — ED Provider Notes (Addendum)
CSN: [id number]     Arrival date & time 07/03/14  0102 History   First MD Initiated Contact with Patient 07/03/14 0107     Chief Complaint  Patient presents with  . Fall   Level V caveat for age  (Consider location/radiation/quality/duration/timing/severity/associated sxs/prior Treatment) HPI  Per nursing staff at patient's facility the patient slid out of her wheelchair tonight and was complaining of pain in her right knee. Patient reports she was walking and fell. She states she hit her head and "it bounced on the floor". She denies loss of consciousness. She complains of headache. She also complains of pain in her right hip and her right knee. Of note patient was just seen in the emergency department on January 12 with complaints of knee pain on the right after a fall. She denies any other injury. Son states she is getting physical therapy however patient has had a lot of falls because she tries to walk unassisted. He states she normally is in a wheelchair.  PCP Dr Renard Matter  Patient is DO NOT RESUSCITATE  Past Medical History  Diagnosis Date  . Hypothyroidism   . Shortness of breath   . Schatzki's ring   . Hypertension   . Vertebral compression fracture   . Arthritis     right knee  . Peripheral neuropathy   . Hiatal hernia    Past Surgical History  Procedure Laterality Date  . Esophagogastroduodenoscopy  10/24/08    normal, status post [facility] dilator, small hiatal hernia deformity of the antrum/pylorus  . Colonoscopy  10/24/08    anal papilla otherwise normal, pan colonic diverticulum and colonic mucosa  . Appendectomy  1941  . Abdominal hysterectomy  1977  . Cholecystectomy  1967  . Spine surgery  1986  . Knee surgery  09/2008  . Hemorrhoid surgery  1999  . Colonoscopy  remote    Dr. Jerolyn Shin Smith-->polpys per patient  . Ercp  1995    Dr. Rourk--> for dilated biliary tree. no stone or neoplasm or stricture found  . Esophagogastroduodenoscopy  1995    Dr. Rourk--> empiric  dilation of esophagus with resolution of dysphagia  . Esophagogastroduodenoscopy   09/21/2011    RMR: Noncritical Schatzki's ring s/p dilated/Abnormal esophageal mucosa/Small  hiatal hermia  . Shoulder surgery    . Cystoscopy with stent placement Bilateral 05/20/2014    Procedure: CYSTOSCOPY WITH BILATERAL DOUBLE J STENT PLACEMENT;  Surgeon: Chelsea Aus, MD;  Location: [facility];  Service: Urology;  Laterality: Bilateral;  . Holmium laser application Bilateral 06/26/2014    Procedure: HOLMIUM LASER APPLICATION;  Surgeon: Chelsea Aus, MD;  Location: [facility];  Service: Urology;  Laterality: Bilateral;   Family History  Problem Relation Age of Onset  . Heart attack Father 28    deceased   History  Substance Use Topics  . Smoking status: Never Smoker   . Smokeless tobacco: Not on file  . Alcohol Use: No   Lives in [facility] Uses a walker/wheelchair  OB History    No data available     Review of Systems  All other systems reviewed and are negative.     Allergies  Lactulose; Penicillins; and Prilosec  Home Medications   Prior to Admission medications   Medication Sig Start Date End Date Taking? Authorizing Provider  acetaminophen (PAIN RELIEVER) 325 MG tablet Take 650 mg by mouth every 6 (six) hours as needed for mild pain or moderate pain.    Historical Provider, MD  Despina Hidden  Oil (VENELEX) OINT Apply 1 application topically 3 (three) times daily. Applied to buttock    Historical Provider, MD  gabapentin (NEURONTIN) 600 MG tablet Take 600 mg by mouth at bedtime.    Historical Provider, MD  ipratropium-albuterol (DUONEB) 0.5-2.5 (3) MG/3ML SOLN Take 3 mLs by nebulization every 6 (six) hours as needed (Shortness of breath).    Historical Provider, MD  levothyroxine (SYNTHROID, LEVOTHROID) 100 MCG tablet Take 100 mcg by mouth daily before breakfast.    Historical Provider, MD  LORazepam (ATIVAN) 0.5 MG tablet Take 0.5 mg by mouth every 8 (eight) hours. Unsure of  dosage    Historical Provider, MD  trimethoprim (TRIMPEX) 100 MG tablet Take 1 tablet (100 mg total) by mouth at bedtime. 06/26/14   Chelsea Aus, MD   BP 126/114 mmHg  Pulse 65  Temp(Src) 98.6 F (37 C)  Resp 18  Ht 5\' 1"  (1.549 m)  Wt 156 lb (70.761 kg)  BMI 29.49 kg/m2  SpO2 98%  Vital signs normal except hypertension  Physical Exam  Constitutional: She is oriented to person, place, and time.  Non-toxic appearance. She does not appear ill. No distress.  Elderly frail female  HENT:  Head: Normocephalic.  Right Ear: External ear normal.  Left Ear: External ear normal.  Nose: Nose normal. No mucosal edema or rhinorrhea.  Mouth/Throat: Oropharynx is clear and moist and mucous membranes are normal. No dental abscesses or uvula swelling.  Yellow bruising on the right side of her face from prior falls States her head is tender to palpation but not localized.   Eyes: Conjunctivae and EOM are normal. Pupils are equal, round, and reactive to light.  Neck: Normal range of motion and full passive range of motion without pain. Neck supple.  Denies neck pain  Cardiovascular: Normal rate, regular rhythm and normal heart sounds.  Exam reveals no gallop and no friction rub.   No murmur heard. Pulmonary/Chest: Effort normal and breath sounds normal. No respiratory distress. She has no wheezes. She has no rhonchi. She has no rales. She exhibits no tenderness and no crepitus.  Abdominal: Soft. Normal appearance and bowel sounds are normal. She exhibits no distension. There is no tenderness. There is no rebound and no guarding.  Musculoskeletal: Normal range of motion. She exhibits tenderness. She exhibits no edema.  PT noted to have shortening and internal rotation of her RLE, during conversation pt yells out and puts her hand on her right hip b/o pain. No obvious injury to her right knee but c/o knee pain.   Neurological: She is alert and oriented to person, place, and time. She has normal  strength. No cranial nerve deficit.  Skin: Skin is warm, dry and intact. No rash noted. No erythema. No pallor.  Psychiatric: She has a normal mood and affect. Her speech is normal and behavior is normal. Her mood appears not anxious.  Nursing note and vitals reviewed.   ED Course  Procedures (including critical care time)  Medications  0.9 %  sodium chloride infusion (not administered)  fentaNYL (SUBLIMAZE) injection 25 mcg (25 mcg Nasal Given 07/03/14 0128)    Son states patient had surgery last week. On review of her chart she had urosepsis and had bilateral stents placed in her ureters. On January 14 she had laser surgery removal of her bilateral ureteral stones.  2:30 AM son shone patient's x-rays and explained need for admission for surgery.  Review of her chart shows on January 14 she had a urine  culture done that showed 35,000 colonies of yeast, December 8 she had 35,000 colonies of multiple species, and March 20 she had 50,000 colonies of enterococcus that was sensitive to Levaquin. She is currently on trimethoprim at bedtime.  04:08 Dr Romeo Apple, states to have hospitalist admit  04:20 Dr Alvester Morin, will come admit patient.   Labs Review Results for orders placed or performed during the hospital encounter of 07/03/14  Comprehensive metabolic panel  Result Value Ref Range   Sodium 134 (L) 135 - 145 mmol/L   Potassium 4.9 3.5 - 5.1 mmol/L   Chloride 106 96 - 112 mEq/L   CO2 22 19 - 32 mmol/L   Glucose, Bld 146 (H) 70 - 99 mg/dL   BUN 45 (H) 6 - 23 mg/dL   Creatinine, Ser 1.61 (H) 0.50 - 1.10 mg/dL   Calcium 8.6 8.4 - 09.6 mg/dL   Total Protein 7.0 6.0 - 8.3 g/dL   Albumin 3.9 3.5 - 5.2 g/dL   AST 16 0 - 37 U/L   ALT 13 0 - 35 U/L   Alkaline Phosphatase 80 39 - 117 U/L   Total Bilirubin 0.4 0.3 - 1.2 mg/dL   GFR calc non Af Amer 18 (L) >90 mL/min   GFR calc Af Amer 21 (L) >90 mL/min   Anion gap 6 5 - 15  CBC with Differential  Result Value Ref Range   WBC 9.0 4.0 -  10.5 K/uL   RBC 3.71 (L) 3.87 - 5.11 MIL/uL   Hemoglobin 10.6 (L) 12.0 - 15.0 g/dL   HCT 04.5 (L) 40.9 - 81.1 %   MCV 89.8 78.0 - 100.0 fL   MCH 28.6 26.0 - 34.0 pg   MCHC 31.8 30.0 - 36.0 g/dL   RDW 91.4 78.2 - 95.6 %   Platelets 275 150 - 400 K/uL   Neutrophils Relative % 65 43 - 77 %   Neutro Abs 5.9 1.7 - 7.7 K/uL   Lymphocytes Relative 22 12 - 46 %   Lymphs Abs 1.9 0.7 - 4.0 K/uL   Monocytes Relative 6 3 - 12 %   Monocytes Absolute 0.6 0.1 - 1.0 K/uL   Eosinophils Relative 6 (H) 0 - 5 %   Eosinophils Absolute 0.5 0.0 - 0.7 K/uL   Basophils Relative 1 0 - 1 %   Basophils Absolute 0.1 0.0 - 0.1 K/uL  Protime-INR  Result Value Ref Range   Prothrombin Time 13.0 11.6 - 15.2 seconds   INR 0.97 0.00 - 1.49  APTT  Result Value Ref Range   aPTT 31 24 - 37 seconds  Urinalysis, Routine w reflex microscopic  Result Value Ref Range   Color, Urine ORANGE (A) YELLOW   APPearance HAZY (A) CLEAR   Specific Gravity, Urine 1.020 1.005 - 1.030   pH 7.0 5.0 - 8.0   Glucose, UA NEGATIVE NEGATIVE mg/dL   Hgb urine dipstick LARGE (A) NEGATIVE   Bilirubin Urine NEGATIVE NEGATIVE   Ketones, ur NEGATIVE NEGATIVE mg/dL   Protein, ur 213 (A) NEGATIVE mg/dL   Urobilinogen, UA 0.2 0.0 - 1.0 mg/dL   Nitrite POSITIVE (A) NEGATIVE   Leukocytes, UA LARGE (A) NEGATIVE  Urine microscopic-add on  Result Value Ref Range   Squamous Epithelial / LPF FEW (A) RARE   WBC, UA TOO NUMEROUS TO COUNT <3 WBC/hpf   RBC / HPF TOO NUMEROUS TO COUNT <3 RBC/hpf   Bacteria, UA MANY (A) RARE  Sample to Blood Bank  Result Value Ref Range  Blood Bank Specimen SAMPLE AVAILABLE FOR TESTING    Sample Expiration 07/06/2014     Laboratory interpretation all normal except UTI, anemia, mild worsening of her renal insufficiency    Imaging Review Dg Chest 1 View  07/03/2014   CLINICAL DATA:  Fall at nursing  EXAM: CHEST - 1 VIEW  COMPARISON:  05/24/2014  FINDINGS: There is mild cardiomegaly which is stable from prior.  The right hilum is prominent, as seen on previous studies, likely from mediastinal rotation. No mass is seen on abdominal CT 05/20/2014 which encompasses much of the right hilum. There is aortic tortuosity which is unchanged. There is no edema, consolidation, effusion, or pneumothorax. Chronic mild elevation of the left diaphragm with overlying atelectasis or scar. No appreciable fracture.  IMPRESSION: No active disease.   Electronically Signed   By: Tiburcio Pea M.D.   On: 07/03/2014 02:22   Dg Knee Complete 4 Views Right  07/03/2014   CLINICAL DATA:  Fall at nursing home.  Initial encounter.  EXAM: RIGHT KNEE - COMPLETE 4+ VIEW  COMPARISON:  01/25/2014  FINDINGS: There is a moderate knee joint effusion, stable to mildly increased from prior. No acute fracture or malalignment is detected. There is tricompartmental osteoarthritis with joint narrowing advanced in the medial compartment. There may be intra-articular debris in the suprapatellar joint fluid.  IMPRESSION: 1. No acute osseous findings. 2. Tricompartmental osteoarthritis and chronic knee joint effusion.   Electronically Signed   By: Tiburcio Pea M.D.   On: 07/03/2014 02:20   Dg Hip Unilat With Pelvis 2-3 Views Right  07/03/2014   CLINICAL DATA:  Fall at nursing home with hip pain. Initial encounter  EXAM: DG HIP W/ PELVIS 2-3V*R*  COMPARISON:  06/24/2014  FINDINGS: There is an acute right femoral neck fracture with varus angulation. The right hip remains located. No significant hip degeneration.  The pelvic ring is intact.  The left hip is located and intact.  IMPRESSION: Impacted right femoral neck fracture.   Electronically Signed   By: Tiburcio Pea M.D.   On: 07/03/2014 02:18             Diagnostic report text  CLINICAL DATA: Fall from wheelchair with right hip fracture. Initial encounter  EXAM: CT HEAD WITHOUT CONTRAST  CT CERVICAL SPINE WITHOUT CONTRAST  TECHNIQUE: Multidetector CT imaging of the head and cervical spine  was performed following the standard protocol without intravenous contrast. Multiplanar CT image reconstructions of the cervical spine were also generated.  COMPARISON: 06/24/2014 head CT  FINDINGS: CT HEAD FINDINGS  Skull and Sinuses:No acute fracture or destructive process.  There is chronic focal mucosal thickening in a right posterior ethmoid air cell. No sinus effusion.  Orbits: Bilateral cataract resection. No traumatic findings.  Brain: No evidence of acute infarction, hemorrhage, hydrocephalus, or mass lesion/mass effect.  There is generalized brain atrophy with ex vacuo ventricular enlargement. Chronic small-vessel ischemic disease with gliosis most confluent around the frontal horns of the lateral ventricles.  CT CERVICAL SPINE FINDINGS  No acute fracture or traumatic malalignment. A lucency through the right superior articular facet of C2 is chronic based on previous imaging. Apparent cortical lucency through the dens is motion artifact. There is multilevel facet and disc degeneration without high-grade osseous canal or foraminal stenosis.  IMPRESSION: 1. No evidence of acute intracranial or cervical spine injury. 2. Brain atrophy and chronic small vessel disease.      EKG Interpretation   Date/Time:  Thursday July 03 2014 02:40:54 EST Ventricular  Rate:  63 PR Interval:  180 QRS Duration: 81 QT Interval:  448 QTC Calculation: 459 R Axis:   -22 Text Interpretation:  Undetermined rhythm Abnormal R-wave progression,  early transition Inferior infarct, age indeterminate Consider anterior  infarct Baseline wander in lead(s) III aVF V4 Electrode noise Confirmed by  Mccrae Speciale  MD-I, Kayla Weekes ([id number]) on 07/03/2014 3:44:23 AM      MDM   Final diagnoses:  Fall at nursing home, initial encounter  Pain  Femoral neck fracture, left, closed, initial encounter  Urinary tract infection without hematuria, site unspecified    Plan admission  Devoria AlbeIva Rithy Mandley, MD,  Franz DellFACEP     Nema Oatley L Ahron Hulbert, MD 07/03/14 Omar [id number]  Ward GivensIva L Shiori Adcox, MD 07/03/14 [phone]

## 2014-07-03 NOTE — Care Management Note (Addendum)
    Page 1 of 1   07/07/2014     1:27:49 PM CARE MANAGEMENT NOTE 07/07/2014  Patient:  [name],[name]   Account Number:  [id number]  Date Initiated:  07/03/2014  Documentation initiated by:  CHILDRESS,JESSICA  Subjective/Objective Assessment:   [facility] Pt to have surgery on hip on 1/22. Pt plans to return to SNF at discharge. CSW aware of discharge plan and will arrange for return to facility. No CM needs.     Action/Plan:   Anticipated DC Date:  07/06/2014   Anticipated DC Plan:  SKILLED NURSING FACILITY  In-house referral  Clinical Social Worker      DC Planning Services  CM consult      Choice offered to / List presented to:             Status of service:  Completed, signed off Medicare Important Message given?  YES (If response is "NO", the following Medicare IM given date fields will be blank) Date Medicare IM given:  07/04/2014 Medicare IM given by:  Anibal HendersonBOLDEN,GENEVA Date Additional Medicare IM given:  07/07/2014 Additional Medicare IM given by:  JESSICA CHILDRESS  Discharge Disposition:  SKILLED NURSING FACILITY  Per UR Regulation:    If discussed at Long Length of Stay Meetings, dates discussed:    Comments:  07/07/2014 1330 Kathyrn SheriffJessica Childress, RN, MSN, PCCN Pt to dishcarge back to SNF today. CSW has arranged for return to facility. No CM needs at this time. 07/04/2014 1000 Kathyrn SheriffJessica Childress, RN, MSN, [facility] 07/03/2014 1400 Kathyrn SheriffJessica Childress, RN, MSN, [facility]

## 2014-07-03 NOTE — ED Notes (Signed)
Admitting MD at bedside to assess

## 2014-07-03 NOTE — ED Notes (Signed)
Pt from [facility] after sliding out of her wheelchair and injuring her right knee.

## 2014-07-03 NOTE — Consult Note (Signed)
Reason for Consult:right femoral neck displaced fracture  Referring Physician: Dr Freda Jackson is an 81 y.o. female.  HPI: taken from h/p due to dementia Patient name: [name] record number: [id number] Date of birth: 08/20/1920          Age: 81 y.o.     Gender: female  Primary Care Provider: Lanette Hampshire, MD  Chief Complaint: hip fracture, mechanical fall, UTI   History of Present Illness:This is a 81 y.o. year old female with significant past medical history of dementia, ureteral calculi s/p lithotripsy and stent 06/26/14, hypothyroidism, HTN presenting with hip fracture, mechanical fall, UTI. Level V caveat as pt is confused. Son at bedside. Pt resident of local SNF. [name] received call from SNF that pt fell, landing on her R hip. Noted to have been admitted 06/26/14 for bilateral ureteral stones w/ lithothripsy and ureteral stent placement. On trimpex outpt. No reports of fever, nausea, vomiting.   Presented to ER afebrile, hemodynamically stable. BP 120s-140s/50s-110s in setting of pain. CBC and BMET grossly WNL. Cr 2.27. L hip xray w/ impacted L femoral neck fracture. CXR WNL. Knee xray w/ degenerative changes on small effusion. UA indicative of infection. Noted urine culture  35k colony and yeast.   EDP (I Knapp) discussed case w/ ortho at [facility]).  Talking to the family the patient does not walk independently even when she was at home. She would not use her walker and with used to furniture to get to the bathroom to get up and down. Her dementia has been progressively worsening. She forgets to use her walker.  As far as the urinary tract infection goes 35,000 colony count may just be the residual from her previous urinary tract infection as it does not even reach the level of 100,000 colonies. However, she should have IV antibiotics to to treat that and proceed with surgery after hydration.  Past Medical History  Diagnosis Date  . Hypothyroidism   . Shortness  of breath   . Schatzki's ring   . Hypertension   . Vertebral compression fracture   . Arthritis     right knee  . Peripheral neuropathy   . Hiatal hernia     Past Surgical History  Procedure Laterality Date  . Esophagogastroduodenoscopy  10/24/08    normal, status post [facility] dilator, small hiatal hernia deformity of the antrum/pylorus  . Colonoscopy  10/24/08    anal papilla otherwise normal, pan colonic diverticulum and colonic mucosa  . Appendectomy  1941  . Abdominal hysterectomy  1977  . Cholecystectomy  1967  . Spine surgery  1986  . Knee surgery  09/2008  . Hemorrhoid surgery  1999  . Colonoscopy  remote    Dr. Leane Para Smith-->polpys per patient  . Ercp  1995    Dr. Rourk--> for dilated biliary tree. no stone or neoplasm or stricture found  . Esophagogastroduodenoscopy  1995    Dr. Rourk--> empiric dilation of esophagus with resolution of dysphagia  . Esophagogastroduodenoscopy   09/21/2011    RMR: Noncritical Schatzki's ring s/p dilated/Abnormal esophageal mucosa/Small  hiatal hermia  . Shoulder surgery    . Cystoscopy with stent placement Bilateral 05/20/2014    Procedure: CYSTOSCOPY WITH BILATERAL DOUBLE J STENT PLACEMENT;  Surgeon: Jorja Loa, MD;  Location: [facility];  Service: Urology;  Laterality: Bilateral;  . Holmium laser application Bilateral 9/37/1696    Procedure: HOLMIUM LASER APPLICATION;  Surgeon: Jorja Loa, MD;  Location:  [facility];  Service: Urology;  Laterality: Bilateral;    Family History  Problem Relation Age of Onset  . Heart attack Father 67    deceased    Social History:  reports that she has never smoked. She does not have any smokeless tobacco history on file. She reports that she does not drink alcohol or use illicit drugs.  Allergies:  Allergies  Allergen Reactions  . Lactulose     REACTION: unknown reaction  . Penicillins     REACTION: unknown reaction  . Prilosec [Omeprazole] Itching and Rash    Medications: I have  reviewed the patient's current medications.  Results for orders placed or performed during the hospital encounter of 07/03/14 (from the past 48 hour(s))  Sample to Blood Bank     Status: None   Collection Time: 07/03/14  2:50 AM  Result Value Ref Range   Blood Bank Specimen SAMPLE AVAILABLE FOR TESTING    Sample Expiration 07/06/2014   Comprehensive metabolic panel     Status: Abnormal   Collection Time: 07/03/14  3:02 AM  Result Value Ref Range   Sodium 134 (L) 135 - 145 mmol/L    Comment: Please note change in reference range.   Potassium 4.9 3.5 - 5.1 mmol/L    Comment: Please note change in reference range.   Chloride 106 96 - 112 mEq/L   CO2 22 19 - 32 mmol/L   Glucose, Bld 146 (H) 70 - 99 mg/dL   BUN 45 (H) 6 - 23 mg/dL   Creatinine, Ser 2.23 (H) 0.50 - 1.10 mg/dL   Calcium 8.6 8.4 - 10.5 mg/dL   Total Protein 7.0 6.0 - 8.3 g/dL   Albumin 3.9 3.5 - 5.2 g/dL   AST 16 0 - 37 U/L   ALT 13 0 - 35 U/L   Alkaline Phosphatase 80 39 - 117 U/L   Total Bilirubin 0.4 0.3 - 1.2 mg/dL   GFR calc non Af Amer 18 (L) >90 mL/min   GFR calc Af Amer 21 (L) >90 mL/min    Comment: (NOTE) The eGFR has been calculated using the CKD EPI equation. This calculation has not been validated in all clinical situations. eGFR's persistently <90 mL/min signify possible Chronic Kidney Disease.    Anion gap 6 5 - 15  CBC with Differential     Status: Abnormal   Collection Time: 07/03/14  3:02 AM  Result Value Ref Range   WBC 9.0 4.0 - 10.5 K/uL   RBC 3.71 (L) 3.87 - 5.11 MIL/uL   Hemoglobin 10.6 (L) 12.0 - 15.0 g/dL   HCT 33.3 (L) 36.0 - 46.0 %   MCV 89.8 78.0 - 100.0 fL   MCH 28.6 26.0 - 34.0 pg   MCHC 31.8 30.0 - 36.0 g/dL   RDW 14.4 11.5 - 15.5 %   Platelets 275 150 - 400 K/uL   Neutrophils Relative % 65 43 - 77 %   Neutro Abs 5.9 1.7 - 7.7 K/uL   Lymphocytes Relative 22 12 - 46 %   Lymphs Abs 1.9 0.7 - 4.0 K/uL   Monocytes Relative 6 3 - 12 %   Monocytes Absolute 0.6 0.1 - 1.0 K/uL    Eosinophils Relative 6 (H) 0 - 5 %   Eosinophils Absolute 0.5 0.0 - 0.7 K/uL   Basophils Relative 1 0 - 1 %   Basophils Absolute 0.1 0.0 - 0.1 K/uL  Protime-INR     Status: None   Collection Time: 07/03/14  3:02 AM  Result Value Ref Range   Prothrombin Time 13.0 11.6 - 15.2 seconds   INR 0.97 0.00 - 1.49  APTT     Status: None   Collection Time: 07/03/14  3:02 AM  Result Value Ref Range   aPTT 31 24 - 37 seconds  Urinalysis, Routine w reflex microscopic     Status: Abnormal   Collection Time: 07/03/14  3:15 AM  Result Value Ref Range   Color, Urine ORANGE (A) YELLOW    Comment: BIOCHEMICALS MAY BE AFFECTED BY COLOR   APPearance HAZY (A) CLEAR   Specific Gravity, Urine 1.020 1.005 - 1.030   pH 7.0 5.0 - 8.0   Glucose, UA NEGATIVE NEGATIVE mg/dL   Hgb urine dipstick LARGE (A) NEGATIVE   Bilirubin Urine NEGATIVE NEGATIVE   Ketones, ur NEGATIVE NEGATIVE mg/dL   Protein, ur 100 (A) NEGATIVE mg/dL   Urobilinogen, UA 0.2 0.0 - 1.0 mg/dL   Nitrite POSITIVE (A) NEGATIVE   Leukocytes, UA LARGE (A) NEGATIVE  Urine microscopic-add on     Status: Abnormal   Collection Time: 07/03/14  3:15 AM  Result Value Ref Range   Squamous Epithelial / LPF FEW (A) RARE   WBC, UA TOO NUMEROUS TO COUNT <3 WBC/hpf   RBC / HPF TOO NUMEROUS TO COUNT <3 RBC/hpf   Bacteria, UA MANY (A) RARE  Comprehensive metabolic panel     Status: Abnormal   Collection Time: 07/03/14  5:24 AM  Result Value Ref Range   Sodium 139 135 - 145 mmol/L    Comment: Please note change in reference range.   Potassium 5.1 3.5 - 5.1 mmol/L    Comment: Please note change in reference range.   Chloride 110 96 - 112 mEq/L   CO2 23 19 - 32 mmol/L   Glucose, Bld 126 (H) 70 - 99 mg/dL   BUN 44 (H) 6 - 23 mg/dL   Creatinine, Ser 2.19 (H) 0.50 - 1.10 mg/dL   Calcium 8.9 8.4 - 10.5 mg/dL   Total Protein 6.7 6.0 - 8.3 g/dL   Albumin 3.7 3.5 - 5.2 g/dL   AST 15 0 - 37 U/L   ALT 13 0 - 35 U/L   Alkaline Phosphatase 65 39 - 117 U/L    Total Bilirubin 0.3 0.3 - 1.2 mg/dL   GFR calc non Af Amer 19 (L) >90 mL/min   GFR calc Af Amer 21 (L) >90 mL/min    Comment: (NOTE) The eGFR has been calculated using the CKD EPI equation. This calculation has not been validated in all clinical situations. eGFR's persistently <90 mL/min signify possible Chronic Kidney Disease.    Anion gap 6 5 - 15  CBC WITH DIFFERENTIAL     Status: Abnormal   Collection Time: 07/03/14  5:24 AM  Result Value Ref Range   WBC 11.3 (H) 4.0 - 10.5 K/uL   RBC 3.57 (L) 3.87 - 5.11 MIL/uL   Hemoglobin 10.4 (L) 12.0 - 15.0 g/dL   HCT 32.0 (L) 36.0 - 46.0 %   MCV 89.6 78.0 - 100.0 fL   MCH 29.1 26.0 - 34.0 pg   MCHC 32.5 30.0 - 36.0 g/dL   RDW 14.5 11.5 - 15.5 %   Platelets 262 150 - 400 K/uL   Neutrophils Relative % 71 43 - 77 %   Neutro Abs 8.0 (H) 1.7 - 7.7 K/uL   Lymphocytes Relative 19 12 - 46 %   Lymphs Abs 2.1 0.7 - 4.0 K/uL  Monocytes Relative 7 3 - 12 %   Monocytes Absolute 0.7 0.1 - 1.0 K/uL   Eosinophils Relative 3 0 - 5 %   Eosinophils Absolute 0.4 0.0 - 0.7 K/uL   Basophils Relative 0 0 - 1 %   Basophils Absolute 0.1 0.0 - 0.1 K/uL    Dg Chest 1 View  07/03/2014   CLINICAL DATA:  Fall at nursing  EXAM: CHEST - 1 VIEW  COMPARISON:  05/24/2014  FINDINGS: There is mild cardiomegaly which is stable from prior. The right hilum is prominent, as seen on previous studies, likely from mediastinal rotation. No mass is seen on abdominal CT 05/20/2014 which encompasses much of the right hilum. There is aortic tortuosity which is unchanged. There is no edema, consolidation, effusion, or pneumothorax. Chronic mild elevation of the left diaphragm with overlying atelectasis or scar. No appreciable fracture.  IMPRESSION: No active disease.   Electronically Signed   By: Jorje Guild M.D.   On: 07/03/2014 02:22   Dg Knee Complete 4 Views Right  07/03/2014   CLINICAL DATA:  Fall at nursing home.  Initial encounter.  EXAM: RIGHT KNEE - COMPLETE 4+ VIEW   COMPARISON:  01/25/2014  FINDINGS: There is a moderate knee joint effusion, stable to mildly increased from prior. No acute fracture or malalignment is detected. There is tricompartmental osteoarthritis with joint narrowing advanced in the medial compartment. There may be intra-articular debris in the suprapatellar joint fluid.  IMPRESSION: 1. No acute osseous findings. 2. Tricompartmental osteoarthritis and chronic knee joint effusion.   Electronically Signed   By: Jorje Guild M.D.   On: 07/03/2014 02:20   Dg Hip Unilat With Pelvis 2-3 Views Right  07/03/2014   CLINICAL DATA:  Fall at nursing home with hip pain. Initial encounter  EXAM: DG HIP W/ PELVIS 2-3V*R*  COMPARISON:  06/24/2014  FINDINGS: There is an acute right femoral neck fracture with varus angulation. The right hip remains located. No significant hip degeneration.  The pelvic ring is intact.  The left hip is located and intact.  IMPRESSION: Impacted right femoral neck fracture.   Electronically Signed   By: Jorje Guild M.D.   On: 07/03/2014 02:18    Review of Systems  Unable to perform ROS: dementia   Blood pressure 147/58, pulse 63, temperature 98.6 F (37 C), resp. rate 18, height 5' 1" (1.549 m), weight 156 lb (70.761 kg), SpO2 96 %. Physical Exam Gen. appearance the patient is lying in bed with no gross deformity. She is obviously disoriented to all parameters I cannot assess her mood or affect Gait cannot walk Upper extremities normal alignment without contracture subluxation atrophy or tremor Left lower extremity normal alignment without contracture subluxation atrophy or tremor  Right lower extremity external rotation with tenderness in the groin and proximal femur, knee joint effusion with tenderness no atrophy no tremor  Abdomen soft no distention  Clavicles nontender  Assessment/Plan: Right hip fracture femoral neck right hip prosthesis  UTI continue IV antibiotics  Potassium check in the morning  Dehydration  hydration  Type and cross for 3 units of blood  Surgery right hip Friday morning  Arther Abbott 07/03/2014, 6:02 AM

## 2014-07-03 NOTE — Care Management Utilization Note (Signed)
UR completed 

## 2014-07-04 ENCOUNTER — Inpatient Hospital Stay (HOSPITAL_COMMUNITY): Payer: Medicare Other | Admitting: Anesthesiology

## 2014-07-04 ENCOUNTER — Encounter (HOSPITAL_COMMUNITY)
Admission: EM | Disposition: A | Discharge status: Skilled Nursing Facility | Payer: Self-pay | Source: Home / Self Care | Attending: Family Medicine

## 2014-07-04 ENCOUNTER — Inpatient Hospital Stay (HOSPITAL_COMMUNITY): Payer: Medicare Other

## 2014-07-04 ENCOUNTER — Encounter (HOSPITAL_COMMUNITY): Payer: Self-pay | Admitting: *Deleted

## 2014-07-04 DIAGNOSIS — S72009A Fracture of unspecified part of neck of unspecified femur, initial encounter for closed fracture: Secondary | ICD-10-CM | POA: Insufficient documentation

## 2014-07-04 HISTORY — PX: HIP ARTHROPLASTY: SHX981

## 2014-07-04 LAB — CBC WITH DIFFERENTIAL/PLATELET
BASOS PCT: 1 % (ref 0–1)
Basophils Absolute: 0.1 10*3/uL (ref 0.0–0.1)
Eosinophils Absolute: 0.6 10*3/uL (ref 0.0–0.7)
Eosinophils Relative: 6 % — ABNORMAL HIGH (ref 0–5)
HCT: 36.9 % (ref 36.0–46.0)
HEMOGLOBIN: 11.7 g/dL — AB (ref 12.0–15.0)
LYMPHS ABS: 2.3 10*3/uL (ref 0.7–4.0)
Lymphocytes Relative: 25 % (ref 12–46)
MCH: 28.3 pg (ref 26.0–34.0)
MCHC: 31.7 g/dL (ref 30.0–36.0)
MCV: 89.3 fL (ref 78.0–100.0)
Monocytes Absolute: 0.9 10*3/uL (ref 0.1–1.0)
Monocytes Relative: 10 % (ref 3–12)
NEUTROS ABS: 5.4 10*3/uL (ref 1.7–7.7)
Neutrophils Relative %: 58 % (ref 43–77)
Platelets: 258 10*3/uL (ref 150–400)
RBC: 4.13 MIL/uL (ref 3.87–5.11)
RDW: 14.7 % (ref 11.5–15.5)
WBC: 9.3 10*3/uL (ref 4.0–10.5)

## 2014-07-04 LAB — URINE CULTURE
Colony Count: NO GROWTH
Culture: NO GROWTH

## 2014-07-04 LAB — COMPREHENSIVE METABOLIC PANEL
ALBUMIN: 3.4 g/dL — AB (ref 3.5–5.2)
ALK PHOS: 62 U/L (ref 39–117)
ALT: 11 U/L (ref 0–35)
AST: 13 U/L (ref 0–37)
Anion gap: 5 (ref 5–15)
BUN: 37 mg/dL — AB (ref 6–23)
CO2: 23 mmol/L (ref 19–32)
Calcium: 8.9 mg/dL (ref 8.4–10.5)
Chloride: 113 mEq/L — ABNORMAL HIGH (ref 96–112)
Creatinine, Ser: 1.85 mg/dL — ABNORMAL HIGH (ref 0.50–1.10)
GFR calc non Af Amer: 23 mL/min — ABNORMAL LOW (ref >90)
GFR, EST AFRICAN AMERICAN: 26 mL/min — AB (ref >90)
Glucose, Bld: 112 mg/dL — ABNORMAL HIGH (ref 70–99)
Potassium: 5 mmol/L (ref 3.5–5.1)
SODIUM: 141 mmol/L (ref 135–145)
Total Bilirubin: 0.8 mg/dL (ref 0.3–1.2)
Total Protein: 6.6 g/dL (ref 6.0–8.3)

## 2014-07-04 LAB — PREPARE RBC (CROSSMATCH)

## 2014-07-04 LAB — SURGICAL PCR SCREEN
MRSA, PCR: NEGATIVE
Staphylococcus aureus: NEGATIVE

## 2014-07-04 SURGERY — HEMIARTHROPLASTY, HIP, DIRECT ANTERIOR APPROACH, FOR FRACTURE
Anesthesia: Spinal | Site: Hip | Laterality: Right

## 2014-07-04 MED ORDER — BUPIVACAINE IN DEXTROSE 0.75-8.25 % IT SOLN
INTRATHECAL | Status: AC
  Filled 2014-07-04: qty 2

## 2014-07-04 MED ORDER — EPINEPHRINE HCL 0.1 MG/ML IJ SOSY
PREFILLED_SYRINGE | INTRAMUSCULAR | Status: DC | PRN
  Administered 2014-07-04: .1 ug via INTRAVENOUS

## 2014-07-04 MED ORDER — EPHEDRINE SULFATE 50 MG/ML IJ SOLN
INTRAMUSCULAR | Status: AC
  Filled 2014-07-04: qty 1

## 2014-07-04 MED ORDER — CLINDAMYCIN PHOSPHATE 600 MG/50ML IV SOLN
INTRAVENOUS | Status: AC
  Filled 2014-07-04: qty 200

## 2014-07-04 MED ORDER — METOCLOPRAMIDE HCL 5 MG/ML IJ SOLN: 5.0000 mg | Freq: Three times a day (TID) | INTRAMUSCULAR | Status: DC | PRN

## 2014-07-04 MED ORDER — CLINDAMYCIN PHOSPHATE 600 MG/50ML IV SOLN
600.0000 mg | Freq: Four times a day (QID) | INTRAVENOUS | Status: AC
  Administered 2014-07-04 (×2): 600 mg via INTRAVENOUS
  Filled 2014-07-04 (×2): qty 50

## 2014-07-04 MED ORDER — LORAZEPAM 0.5 MG PO TABS
0.5000 mg | ORAL_TABLET | ORAL | Status: DC | PRN
  Administered 2014-07-05: 0.5 mg via ORAL
  Filled 2014-07-04: qty 1

## 2014-07-04 MED ORDER — EPHEDRINE SULFATE 50 MG/ML IJ SOLN
INTRAMUSCULAR | Status: DC | PRN
  Administered 2014-07-04 (×2): 5 mg via INTRAVENOUS
  Administered 2014-07-04: 10 mg via INTRAVENOUS
  Administered 2014-07-04 (×2): 5 mg via INTRAVENOUS
  Administered 2014-07-04: 10 mg via INTRAVENOUS
  Administered 2014-07-04 (×4): 5 mg via INTRAVENOUS

## 2014-07-04 MED ORDER — SODIUM CHLORIDE 0.9 % IJ SOLN
INTRAMUSCULAR | Status: AC
  Filled 2014-07-04: qty 10

## 2014-07-04 MED ORDER — PHENOL 1.4 % MT LIQD: 1.0000 | OROMUCOSAL | Status: DC | PRN

## 2014-07-04 MED ORDER — LIDOCAINE HCL (PF) 1 % IJ SOLN
INTRAMUSCULAR | Status: AC
  Filled 2014-07-04: qty 5

## 2014-07-04 MED ORDER — METOCLOPRAMIDE HCL 10 MG PO TABS: 5.0000 mg | ORAL_TABLET | Freq: Three times a day (TID) | ORAL | Status: DC | PRN

## 2014-07-04 MED ORDER — SODIUM CHLORIDE 0.9 % IR SOLN
Status: DC | PRN
  Administered 2014-07-04 (×3): 1000 mL

## 2014-07-04 MED ORDER — MIDAZOLAM HCL 2 MG/2ML IJ SOLN
INTRAMUSCULAR | Status: AC
  Filled 2014-07-04: qty 2

## 2014-07-04 MED ORDER — TRYPSIN-CASTOR OIL-PERU BALSAM EX AERS
1.0000 "application " | INHALATION_SPRAY | Freq: Three times a day (TID) | CUTANEOUS | Status: DC
  Administered 2014-07-04 – 2014-07-07 (×2): 1 via TOPICAL
  Filled 2014-07-04: qty 113.4

## 2014-07-04 MED ORDER — POLYETHYLENE GLYCOL 3350 17 G PO PACK: 17.0000 g | PACK | Freq: Every day | ORAL | Status: DC | PRN

## 2014-07-04 MED ORDER — BUPIVACAINE-EPINEPHRINE (PF) 0.5% -1:200000 IJ SOLN
INTRAMUSCULAR | Status: DC | PRN
  Administered 2014-07-04: 60 mL

## 2014-07-04 MED ORDER — ACETAMINOPHEN 10 MG/ML IV SOLN
1000.0000 mg | Freq: Four times a day (QID) | INTRAVENOUS | Status: DC
  Administered 2014-07-04 – 2014-07-05 (×3): 1000 mg via INTRAVENOUS
  Filled 2014-07-04 (×4): qty 100

## 2014-07-04 MED ORDER — BISACODYL 10 MG RE SUPP
10.0000 mg | Freq: Every day | RECTAL | Status: DC | PRN
Start: 2014-07-04 — End: 2014-07-07
  Administered 2014-07-07: 10 mg via RECTAL
  Filled 2014-07-04: qty 1

## 2014-07-04 MED ORDER — ONDANSETRON HCL 4 MG PO TABS: 4.0000 mg | ORAL_TABLET | Freq: Four times a day (QID) | ORAL | Status: DC | PRN

## 2014-07-04 MED ORDER — SENNA 8.6 MG PO TABS
1.0000 | ORAL_TABLET | Freq: Two times a day (BID) | ORAL | Status: DC
  Administered 2014-07-04 – 2014-07-07 (×5): 8.6 mg via ORAL
  Filled 2014-07-04 (×7): qty 1

## 2014-07-04 MED ORDER — FENTANYL CITRATE 0.05 MG/ML IJ SOLN
INTRAMUSCULAR | Status: AC
  Filled 2014-07-04: qty 2

## 2014-07-04 MED ORDER — MAGNESIUM CITRATE PO SOLN: 1.0000 | Freq: Once | ORAL | Status: AC | PRN

## 2014-07-04 MED ORDER — CHLORHEXIDINE GLUCONATE 4 % EX LIQD
60.0000 mL | Freq: Once | CUTANEOUS | Status: AC
  Administered 2014-07-04: 4 via TOPICAL
  Filled 2014-07-04: qty 118

## 2014-07-04 MED ORDER — DOCUSATE SODIUM 100 MG PO CAPS
100.0000 mg | ORAL_CAPSULE | Freq: Two times a day (BID) | ORAL | Status: DC
  Administered 2014-07-04 – 2014-07-07 (×5): 100 mg via ORAL
  Filled 2014-07-04 (×6): qty 1

## 2014-07-04 MED ORDER — SODIUM CHLORIDE 0.9 % IV SOLN
Freq: Once | INTRAVENOUS | Status: AC
  Administered 2014-07-04: 1000 mL via INTRAVENOUS

## 2014-07-04 MED ORDER — LIDOCAINE HCL (CARDIAC) 10 MG/ML IV SOLN
INTRAVENOUS | Status: DC | PRN
  Administered 2014-07-04: 10 mg via INTRAVENOUS

## 2014-07-04 MED ORDER — ONDANSETRON HCL 4 MG/2ML IJ SOLN
4.0000 mg | Freq: Once | INTRAMUSCULAR | Status: AC
Start: 2014-07-04 — End: 2014-07-04
  Administered 2014-07-04: 4 mg via INTRAVENOUS

## 2014-07-04 MED ORDER — DEXTROSE 5 % IV SOLN
500.0000 mg | Freq: Four times a day (QID) | INTRAVENOUS | Status: DC | PRN
  Filled 2014-07-04: qty 5

## 2014-07-04 MED ORDER — ALUM & MAG HYDROXIDE-SIMETH 200-200-20 MG/5ML PO SUSP: 30.0000 mL | ORAL | Status: DC | PRN

## 2014-07-04 MED ORDER — ONDANSETRON HCL 4 MG/2ML IJ SOLN: 4.0000 mg | Freq: Four times a day (QID) | INTRAMUSCULAR | Status: DC | PRN

## 2014-07-04 MED ORDER — CHLORHEXIDINE GLUCONATE 4 % EX LIQD: Freq: Once | CUTANEOUS | Status: DC

## 2014-07-04 MED ORDER — MIDAZOLAM HCL 2 MG/2ML IJ SOLN
1.0000 mg | INTRAMUSCULAR | Status: DC | PRN
  Administered 2014-07-04: 2 mg via INTRAVENOUS

## 2014-07-04 MED ORDER — ACETAMINOPHEN 10 MG/ML IV SOLN
INTRAVENOUS | Status: AC
  Filled 2014-07-04: qty 200

## 2014-07-04 MED ORDER — FENTANYL CITRATE 0.05 MG/ML IJ SOLN
INTRAMUSCULAR | Status: DC | PRN
  Administered 2014-07-04: 12.5 ug via INTRATHECAL
  Administered 2014-07-04: 12.5 ug via INTRAVENOUS

## 2014-07-04 MED ORDER — HYDROCODONE-ACETAMINOPHEN 5-325 MG PO TABS
1.0000 | ORAL_TABLET | Freq: Four times a day (QID) | ORAL | Status: DC | PRN
  Filled 2014-07-04: qty 1

## 2014-07-04 MED ORDER — ONDANSETRON HCL 4 MG/2ML IJ SOLN
INTRAMUSCULAR | Status: AC
  Filled 2014-07-04: qty 2

## 2014-07-04 MED ORDER — BUPIVACAINE IN DEXTROSE 0.75-8.25 % IT SOLN
INTRATHECAL | Status: DC | PRN
  Administered 2014-07-04: 15 mg via INTRATHECAL

## 2014-07-04 MED ORDER — TRIMETHOPRIM 100 MG PO TABS
100.0000 mg | ORAL_TABLET | Freq: Every day | ORAL | Status: DC
  Administered 2014-07-04 – 2014-07-06 (×3): 100 mg via ORAL
  Filled 2014-07-04 (×5): qty 1

## 2014-07-04 MED ORDER — BUPIVACAINE-EPINEPHRINE (PF) 0.5% -1:200000 IJ SOLN
INTRAMUSCULAR | Status: AC
  Filled 2014-07-04: qty 60

## 2014-07-04 MED ORDER — ENOXAPARIN SODIUM 30 MG/0.3ML ~~LOC~~ SOLN
30.0000 mg | SUBCUTANEOUS | Status: DC
  Administered 2014-07-05 – 2014-07-07 (×3): 30 mg via SUBCUTANEOUS
  Filled 2014-07-04 (×3): qty 0.3

## 2014-07-04 MED ORDER — PROPOFOL 10 MG/ML IV BOLUS
INTRAVENOUS | Status: AC
  Filled 2014-07-04: qty 20

## 2014-07-04 MED ORDER — MENTHOL 3 MG MT LOZG: 1.0000 | LOZENGE | OROMUCOSAL | Status: DC | PRN

## 2014-07-04 MED ORDER — METHOCARBAMOL 500 MG PO TABS: 500.0000 mg | ORAL_TABLET | Freq: Four times a day (QID) | ORAL | Status: DC | PRN

## 2014-07-04 MED ORDER — TRAMADOL HCL 50 MG PO TABS
50.0000 mg | ORAL_TABLET | Freq: Four times a day (QID) | ORAL | Status: DC
  Administered 2014-07-04 – 2014-07-06 (×4): 50 mg via ORAL
  Filled 2014-07-04 (×5): qty 1

## 2014-07-04 MED ORDER — PROPOFOL INFUSION 10 MG/ML OPTIME
INTRAVENOUS | Status: DC | PRN
  Administered 2014-07-04: 50 ug/kg/min via INTRAVENOUS

## 2014-07-04 SURGICAL SUPPLY — 58 items
BAG HAMPER (MISCELLANEOUS) ×3 IMPLANT
BIT DRILL 2.8X128 (BIT) ×2 IMPLANT
BIT DRILL 2.8X128MM (BIT) ×1
BLADE 10 SAFETY STRL DISP (BLADE) ×1 IMPLANT
BLADE HEX COATED 2.75 (ELECTRODE) ×3 IMPLANT
BLADE SAGITTAL 25.0X1.27X90 (BLADE) ×2 IMPLANT
BLADE SAGITTAL 25.0X1.27X90MM (BLADE) ×1
BRUSH FEMORAL CANAL (MISCELLANEOUS) IMPLANT
CAPT HIP HEMI 1 ×2 IMPLANT
CHLORAPREP W/TINT 26ML (MISCELLANEOUS) ×3 IMPLANT
CLOTH BEACON ORANGE TIMEOUT ST (SAFETY) ×3 IMPLANT
COVER LIGHT HANDLE STERIS (MISCELLANEOUS) ×8 IMPLANT
COVER PROBE W GEL 5X96 (DRAPES) ×3 IMPLANT
DECANTER SPIKE VIAL GLASS SM (MISCELLANEOUS) ×6 IMPLANT
DRAPE HIP W/POCKET STRL (DRAPE) ×3 IMPLANT
DRSG MEPILEX BORDER 4X12 (GAUZE/BANDAGES/DRESSINGS) ×3 IMPLANT
ELECT REM PT RETURN 9FT ADLT (ELECTROSURGICAL) ×3
ELECTRODE REM PT RTRN 9FT ADLT (ELECTROSURGICAL) ×1 IMPLANT
EVACUATOR 3/16  PVC DRAIN (DRAIN)
EVACUATOR 3/16 PVC DRAIN (DRAIN) IMPLANT
FACESHIELD LNG OPTICON STERILE (SAFETY) ×3 IMPLANT
GLOVE SKINSENSE NS SZ8.0 LF (GLOVE) ×6
GLOVE SKINSENSE STRL SZ8.0 LF (GLOVE) ×2 IMPLANT
GLOVE SS N UNI LF 8.5 STRL (GLOVE) ×3 IMPLANT
GOWN STRL REUS W/TWL LRG LVL3 (GOWN DISPOSABLE) ×8 IMPLANT
GOWN STRL REUS W/TWL XL LVL3 (GOWN DISPOSABLE) ×3 IMPLANT
INST SET MAJOR BONE (KITS) ×3 IMPLANT
KIT BLADEGUARD II DBL (SET/KITS/TRAYS/PACK) ×3 IMPLANT
KIT ROOM TURNOVER APOR (KITS) ×3 IMPLANT
MANIFOLD NEPTUNE II (INSTRUMENTS) ×3 IMPLANT
MARKER SKIN DUAL TIP RULER LAB (MISCELLANEOUS) ×3 IMPLANT
NDL HYPO 21X1.5 SAFETY (NEEDLE) ×1 IMPLANT
NEEDLE HYPO 21X1.5 SAFETY (NEEDLE) ×3 IMPLANT
NS IRRIG 1000ML POUR BTL (IV SOLUTION) ×7 IMPLANT
PACK ARTHRO LIMB DRAPE STRL (MISCELLANEOUS) ×3 IMPLANT
PACK TOTAL JOINT (CUSTOM PROCEDURE TRAY) ×3 IMPLANT
PAD ARMBOARD 7.5X6 YLW CONV (MISCELLANEOUS) ×3 IMPLANT
PASSER SUT SWANSON 36MM LOOP (INSTRUMENTS) IMPLANT
PILLOW HIP ABDUCTION LRG (ORTHOPEDIC SUPPLIES) IMPLANT
PILLOW HIP ABDUCTION MED (ORTHOPEDIC SUPPLIES) ×2 IMPLANT
PIN STMN 9X.142 IN (PIN) ×3 IMPLANT
PIN STMN SNGL STERILE 9X3.6MM (PIN) ×4 IMPLANT
SET BASIN LINEN APH (SET/KITS/TRAYS/PACK) ×3 IMPLANT
STAPLER VISISTAT 35W (STAPLE) ×3 IMPLANT
SUT BRALON NAB BRD #1 30IN (SUTURE) ×6 IMPLANT
SUT ETHIBOND 5 LR DA (SUTURE) ×6 IMPLANT
SUT MNCRL 0 VIOLET CTX 36 (SUTURE) ×1 IMPLANT
SUT MON AB 2-0 CT1 36 (SUTURE) ×1 IMPLANT
SUT MONOCRYL 0 CTX 36 (SUTURE) ×4
SUT VIC AB 1 CT1 27 (SUTURE) ×6
SUT VIC AB 1 CT1 27XBRD ANTBC (SUTURE) IMPLANT
SYR 30ML LL (SYRINGE) ×3 IMPLANT
SYR BULB IRRIGATION 50ML (SYRINGE) ×3 IMPLANT
TOWER CARTRIDGE SMART MIX (DISPOSABLE) IMPLANT
TRAY FOLEY CATH 16FR SILVER (SET/KITS/TRAYS/PACK) IMPLANT
WATER STERILE IRR 1000ML POUR (IV SOLUTION) ×2 IMPLANT
YANKAUER SUCT 12FT TUBE ARGYLE (SUCTIONS) ×3 IMPLANT
YANKAUER SUCT BULB TIP NO VENT (SUCTIONS) ×2 IMPLANT

## 2014-07-04 NOTE — Progress Notes (Signed)
Pt was not able to do the incentive

## 2014-07-04 NOTE — Transfer of Care (Signed)
Immediate Anesthesia Transfer of Care Note  Patient: [name]  Procedure(s) Performed: Procedure(s): RIGHT PARTIAL HIP REPLACEMENT (Right)  Patient Location: PACU  Anesthesia Type:Spinal  Level of Consciousness: sedated and patient cooperative  Airway & Oxygen Therapy: Patient Spontanous Breathing and non-rebreather face mask  Post-op Assessment: Report given to PACU RN and Post -op Vital signs reviewed and stable  Post vital signs: Reviewed and stable  Complications: No apparent anesthesia complications

## 2014-07-04 NOTE — Anesthesia Preprocedure Evaluation (Signed)
Anesthesia Evaluation  Patient identified by MRN, date of birth, ID band Patient confused    Reviewed: Allergy & Precautions, NPO status , Patient's Chart, lab work & pertinent test results  Airway Mallampati: II  TM Distance: <3 FB     Dental  (+) Poor Dentition, Dental Advisory Given, Loose   Pulmonary shortness of breath,  breath sounds clear to auscultation        Cardiovascular hypertension, Pt. on medications Rhythm:Regular Rate:Normal     Neuro/Psych PSYCHIATRIC DISORDERS  Neuromuscular disease    GI/Hepatic hiatal hernia,   Endo/Other  Hypothyroidism   Renal/GU Renal InsufficiencyRenal disease     Musculoskeletal  (+) Arthritis -,   Abdominal   Peds  Hematology   Anesthesia Other Findings   Reproductive/Obstetrics                             Anesthesia Physical Anesthesia Plan  ASA: III  Anesthesia Plan: Spinal   Post-op Pain Management:    Induction: Intravenous  Airway Management Planned: Simple Face Mask  Additional Equipment:   Intra-op Plan:   Post-operative Plan:   Informed Consent: I have reviewed the patients History and Physical, chart, labs and discussed the procedure including the risks, benefits and alternatives for the proposed anesthesia with the patient or authorized representative who has indicated his/her understanding and acceptance.     Plan Discussed with:   Anesthesia Plan Comments:         Anesthesia Quick Evaluation

## 2014-07-04 NOTE — Op Note (Signed)
Operative note for 07/04/2014 for bipolar prosthesis/partial hip replacement right lower extremity  Surgeon Romeo AppleHarrison  Preop diagnosis right femoral neck fracture  Postop diagnosis same  Procedure partial right hip replacement with bipolar prosthesis  Implants [place] basic press-fit for stem 48 x 5 head neck bipolar from DEPUY   Complications none  Anesthetic spinal  Assistants [place] [facility]ty Ashley and [facility] page  Operative findings complete femoral neck fracture no significant arthritis  Details of procedure  The patient was identified in the preop holding area as [name]. The surgical site was confirmed as right hip. The surgical site was marked. The patient was taken to the operating room and received clindamycin 900 mg per surgical protocol based on her allergies  She had a spinal anesthetic without complications  The patient was placed in the lateral decubitus position with an axillary roll and appropriate padding  The patient was placed in a Stulberg hip positioner We prepped with ChloraPrep and draped sterilely.  The incision was made over the greater trochanter of the right hip and deepened down to the fascial layer. The fascia was split in line with the skin incision. After retractors were placed and Montez MoritaCarter he was used to control bleeding a bursectomy was performed of the greater trochanteric bursa. The gluteus medius musculature was identified and the anterior half in continuity with the vastus lateralis was subperiosteally removed from the greater trochanter and proximal femur coagulating the ace ascending branch of the lateral femoral artery.  A capsulectomy was performed. The femoral head was removed. The femoral head measured 48 mm.  The leg was dislocated anteriorly the acetabulum was cleaned and inspected and found to have minimal arthritic changes.  The femoral neck cutting guide was used to make a proximal femoral neck cut a finger breath above the lesser  trochanter. A starter drill was used to make a hole in the proximal femur. Box osteotome followed curette and canal finder followed.  Serial broaching was performed up to a size 4 followed by proximal calcar reamer.  Trial reduction was performed with size 4 femur and 48 x 5 mm head. After checking range of motion and stability as well as leg length the hip was dislocated. Appropriate leg length and stability was restored with the implants size 4 femur and 48 x 5 mm head.  Acetabular was irrigated and dried the implants were placed the hip was reduced. We reproduce the trial reduction and started closure.  Drill holes were placed in the proximal femur and #5 Ethibond sutures were passed which were used to repair the gluteus medius musculature to the greater trochanter and then #1 Bralon was used to repair the contINUation of the vastus lateralis.  30 mL of Marcaine were placed deep in the hip and vastus lateralis musculature. The fascial layer was closed with running #1 Bralon. Second 30 mL of Marcaine was placed in the subcutaneous tissues. #0 Monocryl suture was used to close the subcutaneous tissue and staples were used to reapproximate the skin  A sterile dressing was applied. The patient was placed back on the regular bed. Leg lengths were checked for one final time and found to be equal. Abduction pillow was placed.  Patient is weightbearing as tolerated with anterior hip precautions

## 2014-07-04 NOTE — Brief Op Note (Signed)
07/03/2014 - 07/04/2014  11:58 AM  PATIENT:  [name]  81 y.o. female  PRE-OPERATIVE DIAGNOSIS:  right hip femoral neck fracture  POST-OPERATIVE DIAGNOSIS:  right hip femoral neck fracture  PROCEDURE:  Procedure(s): RIGHT PARTIAL HIP REPLACEMENT (Right)   DEPUY [place] BASIC PF STEM 4  BIPOLAR HEAD 5/48  SURGEON:  Surgeon(s) and Role:    * Vickki HearingStanley E Harrison, MD - Primary  PHYSICIAN ASSISTANT:   ASSISTANTS: BETTY ASHLEY AND CATHERINE PAGE    ANESTHESIA:   spinal  EBL:  Total I/O In: 700 [I.V.:700] Out: 265 [Urine:175; Blood:90]  BLOOD ADMINISTERED:none  DRAINS: none   LOCAL MEDICATIONS USED:  MARCAINE   , Amount: 60 ml and OTHER epinephrine  SPECIMEN:  No Specimen  DISPOSITION OF SPECIMEN:  N/A  COUNTS:  YES  TOURNIQUET:  * No tourniquets in log *  DICTATION: .Dragon Dictation  PLAN OF CARE: Admit to inpatient   PATIENT DISPOSITION:  PACU - hemodynamically stable.   Delay start of Pharmacological VTE agent (>24hrs) due to surgical blood loss or risk of bleeding: yes

## 2014-07-04 NOTE — Progress Notes (Signed)
Subjective: The patient remains mentally confused. She was admitted with right femoral neck fracture which occurred following fall and nursing facility  Objective: Vital signs in last 24 hours: Temp:  [98 F (36.7 C)-98.7 F (37.1 C)] 98.7 F (37.1 C) (01/22 0934) Pulse Rate:  [58-72] 61 (01/22 0934) Resp:  [15-28] 24 (01/22 1000) BP: (126-164)/(55-94) 132/59 mmHg (01/22 1000) SpO2:  [96 %-100 %] 100 % (01/22 1000) Weight:  [68.04 kg (150 lb)] 68.04 kg (150 lb) (01/22 0934) Weight change:     Intake/Output from previous day: 01/21 0701 - 01/22 0700 In: 835 [I.V.:500; Blood:335] Out: 2000 [Urine:2000] Intake/Output this shift: Total I/O In: -  Out: 175 [Urine:175]  Physical Exam: Gen. appearance patient is mentally confused HEENT negative  Neck supple no JVD or thyroid abnormalities  Heart regular rhythm no murmurs  Lungs clear to P&A  Abdomen no palpable organs or masses   Shortening and internal rotation of her right leg    Recent Labs  07/03/14 0524 07/04/14 0615  WBC 11.3* 9.3  HGB 10.4* 11.7*  HCT 32.0* 36.9  PLT 262 258   BMET  Recent Labs  07/03/14 0524 07/04/14 0615  NA 139 141  K 5.1 5.0  CL 110 113*  CO2 23 23  GLUCOSE 126* 112*  BUN 44* 37*  CREATININE 2.19* 1.85*  CALCIUM 8.9 8.9    Studies/Results: Dg Chest 1 View  07/03/2014   CLINICAL DATA:  Fall at nursing  EXAM: CHEST - 1 VIEW  COMPARISON:  05/24/2014  FINDINGS: There is mild cardiomegaly which is stable from prior. The right hilum is prominent, as seen on previous studies, likely from mediastinal rotation. No mass is seen on abdominal CT 05/20/2014 which encompasses much of the right hilum. There is aortic tortuosity which is unchanged. There is no edema, consolidation, effusion, or pneumothorax. Chronic mild elevation of the left diaphragm with overlying atelectasis or scar. No appreciable fracture.  IMPRESSION: No active disease.   Electronically Signed   By: Tiburcio PeaJonathan  Watts M.D.    On: 07/03/2014 02:22   Ct Head Wo Contrast  07/03/2014   CLINICAL DATA:  Fall from wheelchair with right hip fracture. Initial encounter  EXAM: CT HEAD WITHOUT CONTRAST  CT CERVICAL SPINE WITHOUT CONTRAST  TECHNIQUE: Multidetector CT imaging of the head and cervical spine was performed following the standard protocol without intravenous contrast. Multiplanar CT image reconstructions of the cervical spine were also generated.  COMPARISON:  06/24/2014 head CT  FINDINGS: CT HEAD FINDINGS  Skull and Sinuses:No acute fracture or destructive process.  There is chronic focal mucosal thickening in a right posterior ethmoid air cell. No sinus effusion.  Orbits: Bilateral cataract resection.  No traumatic findings.  Brain: No evidence of acute infarction, hemorrhage, hydrocephalus, or mass lesion/mass effect.  There is generalized brain atrophy with ex vacuo ventricular enlargement. Chronic small-vessel ischemic disease with gliosis most confluent around the frontal horns of the lateral ventricles.  CT CERVICAL SPINE FINDINGS  No acute fracture or traumatic malalignment. A lucency through the right superior articular facet of C2 is chronic based on previous imaging. Apparent cortical lucency through the dens is motion artifact. There is multilevel facet and disc degeneration without high-grade osseous canal or foraminal stenosis.  IMPRESSION: 1. No evidence of acute intracranial or cervical spine injury. 2. Brain atrophy and chronic small vessel disease.   Electronically Signed   By: Tiburcio PeaJonathan  Watts M.D.   On: 07/03/2014 02:51   Ct Cervical Spine Wo Contrast  07/03/2014  CLINICAL DATA:  Fall from wheelchair with right hip fracture. Initial encounter  EXAM: CT HEAD WITHOUT CONTRAST  CT CERVICAL SPINE WITHOUT CONTRAST  TECHNIQUE: Multidetector CT imaging of the head and cervical spine was performed following the standard protocol without intravenous contrast. Multiplanar CT image reconstructions of the cervical spine  were also generated.  COMPARISON:  06/24/2014 head CT  FINDINGS: CT HEAD FINDINGS  Skull and Sinuses:No acute fracture or destructive process.  There is chronic focal mucosal thickening in a right posterior ethmoid air cell. No sinus effusion.  Orbits: Bilateral cataract resection.  No traumatic findings.  Brain: No evidence of acute infarction, hemorrhage, hydrocephalus, or mass lesion/mass effect.  There is generalized brain atrophy with ex vacuo ventricular enlargement. Chronic small-vessel ischemic disease with gliosis most confluent around the frontal horns of the lateral ventricles.  CT CERVICAL SPINE FINDINGS  No acute fracture or traumatic malalignment. A lucency through the right superior articular facet of C2 is chronic based on previous imaging. Apparent cortical lucency through the dens is motion artifact. There is multilevel facet and disc degeneration without high-grade osseous canal or foraminal stenosis.  IMPRESSION: 1. No evidence of acute intracranial or cervical spine injury. 2. Brain atrophy and chronic small vessel disease.   Electronically Signed   By: Tiburcio Pea M.D.   On: 07/03/2014 02:51   Dg Knee Complete 4 Views Right  07/03/2014   CLINICAL DATA:  Fall at nursing home.  Initial encounter.  EXAM: RIGHT KNEE - COMPLETE 4+ VIEW  COMPARISON:  01/25/2014  FINDINGS: There is a moderate knee joint effusion, stable to mildly increased from prior. No acute fracture or malalignment is detected. There is tricompartmental osteoarthritis with joint narrowing advanced in the medial compartment. There may be intra-articular debris in the suprapatellar joint fluid.  IMPRESSION: 1. No acute osseous findings. 2. Tricompartmental osteoarthritis and chronic knee joint effusion.   Electronically Signed   By: Tiburcio Pea M.D.   On: 07/03/2014 02:20   Dg Hip Unilat With Pelvis 2-3 Views Right  07/03/2014   CLINICAL DATA:  Fall at nursing home with hip pain. Initial encounter  EXAM: DG HIP W/  PELVIS 2-3V*R*  COMPARISON:  06/24/2014  FINDINGS: There is an acute right femoral neck fracture with varus angulation. The right hip remains located. No significant hip degeneration.  The pelvic ring is intact.  The left hip is located and intact.  IMPRESSION: Impacted right femoral neck fracture.   Electronically Signed   By: Tiburcio Pea M.D.   On: 07/03/2014 02:18    Medications:  . [MAR Hold] cefTRIAXone (ROCEPHIN)  IV  1 [name] Intravenous Q24H  . chlorhexidine   Topical Once  . clindamycin (CLEOCIN) IV  900 mg Intravenous On Call to OR  . [MAR Hold] gabapentin  600 mg Oral QHS  . [MAR Hold] heparin  5,000 Units Subcutaneous 3 times per day  . [MAR Hold] levothyroxine  100 mcg Oral QAC breakfast    . sodium chloride 100 mL/hr at 07/03/14 0230  . sodium chloride 100 mL/hr at 07/03/14 0501     Assessment/Plan: 1. Right femoral neck fracture-plan to proceed with surgery by orthopedist  2. Recent ureteral stones with lithotripsy and stent placement and ureter   LOS: 1 day   [name] 07/04/2014, 10:14 AM

## 2014-07-04 NOTE — Anesthesia Postprocedure Evaluation (Signed)
  Anesthesia Post-op Note  Patient: [name]  Procedure(s) Performed: Procedure(s): RIGHT PARTIAL HIP REPLACEMENT (Right)  Patient Location: PACU  Anesthesia Type:Spinal  Level of Consciousness: awake and patient cooperative  Airway and Oxygen Therapy: Patient Spontanous Breathing and Patient connected to nasal cannula oxygen  Post-op Pain: none  Post-op Assessment: Post-op Vital signs reviewed, Patient's Cardiovascular Status Stable, Respiratory Function Stable and Patent Airway  Post-op Vital Signs: Reviewed and stable  Last Vitals:  Filed Vitals:   07/04/14 1215  BP: 144/53  Pulse: 70  Temp:   Resp: 12    Complications: No apparent anesthesia complications

## 2014-07-04 NOTE — Anesthesia Procedure Notes (Addendum)
Date/Time: 07/04/2014 10:01 AM Performed by: Franco NonesYATES, Danniel Grenz S Pre-anesthesia Checklist: Patient identified, Emergency Drugs available, Suction available, Timeout performed and Patient being monitored Patient Re-evaluated:Patient Re-evaluated prior to inductionOxygen Delivery Method: Non-rebreather mask    Spinal Patient location during procedure: OR Start time: 07/04/2014 10:13 AM End time: 07/04/2014 10:16 AM Staffing Resident/CRNA: Franco NonesYATES, Kjersten Ormiston S Preanesthetic Checklist Completed: patient identified, site marked, surgical consent, pre-op evaluation, timeout performed, IV checked, risks and benefits discussed and monitors and equipment checked Spinal Block Patient position: right lateral decubitus Prep: Betadine Patient monitoring: heart rate, cardiac monitor, continuous pulse ox and blood pressure Approach: right paramedian Location: L3-4 Injection technique: single-shot Needle Needle type: Spinocan  Needle gauge: 22 G Needle length: 9 cm Assessment Sensory level: T6 Additional Notes Betadine prep x 3 1% lidocaine skin wheal 1 cc Clear CSF pre and post injection   ATTEMPTS:1 TRAY ID: [id number] TRAY EXPIRATION DATE: 2017-03

## 2014-07-05 LAB — CBC WITH DIFFERENTIAL/PLATELET
BASOS ABS: 0 10*3/uL (ref 0.0–0.1)
Basophils Relative: 0 % (ref 0–1)
Eosinophils Absolute: 0.2 10*3/uL (ref 0.0–0.7)
Eosinophils Relative: 2 % (ref 0–5)
HEMATOCRIT: 32.5 % — AB (ref 36.0–46.0)
HEMOGLOBIN: 10.4 g/dL — AB (ref 12.0–15.0)
LYMPHS ABS: 1.2 10*3/uL (ref 0.7–4.0)
Lymphocytes Relative: 13 % (ref 12–46)
MCH: 28.8 pg (ref 26.0–34.0)
MCHC: 32 g/dL (ref 30.0–36.0)
MCV: 90 fL (ref 78.0–100.0)
MONOS PCT: 11 % (ref 3–12)
Monocytes Absolute: 1.1 10*3/uL — ABNORMAL HIGH (ref 0.1–1.0)
NEUTROS PCT: 74 % (ref 43–77)
Neutro Abs: 7.1 10*3/uL (ref 1.7–7.7)
Platelets: 214 10*3/uL (ref 150–400)
RBC: 3.61 MIL/uL — AB (ref 3.87–5.11)
RDW: 14.6 % (ref 11.5–15.5)
WBC: 9.6 10*3/uL (ref 4.0–10.5)

## 2014-07-05 LAB — COMPREHENSIVE METABOLIC PANEL
ALK PHOS: 61 U/L (ref 39–117)
ALT: 12 U/L (ref 0–35)
ANION GAP: 5 (ref 5–15)
AST: 19 U/L (ref 0–37)
Albumin: 3.1 g/dL — ABNORMAL LOW (ref 3.5–5.2)
BILIRUBIN TOTAL: 0.6 mg/dL (ref 0.3–1.2)
BUN: 30 mg/dL — ABNORMAL HIGH (ref 6–23)
CO2: 20 mmol/L (ref 19–32)
Calcium: 8.5 mg/dL (ref 8.4–10.5)
Chloride: 114 mmol/L — ABNORMAL HIGH (ref 96–112)
Creatinine, Ser: 1.61 mg/dL — ABNORMAL HIGH (ref 0.50–1.10)
GFR, EST AFRICAN AMERICAN: 31 mL/min — AB (ref >90)
GFR, EST NON AFRICAN AMERICAN: 27 mL/min — AB (ref >90)
Glucose, Bld: 112 mg/dL — ABNORMAL HIGH (ref 70–99)
Potassium: 5.1 mmol/L (ref 3.5–5.1)
Sodium: 139 mmol/L (ref 135–145)
Total Protein: 5.9 g/dL — ABNORMAL LOW (ref 6.0–8.3)

## 2014-07-05 MED ORDER — ACETAMINOPHEN 10 MG/ML IV SOLN
1000.0000 mg | Freq: Once | INTRAVENOUS | Status: AC
  Administered 2014-07-05: 1000 mg via INTRAVENOUS
  Filled 2014-07-05: qty 100

## 2014-07-05 MED ORDER — DEXTROSE 5 % IV SOLN
INTRAVENOUS | Status: AC
  Filled 2014-07-05: qty 10

## 2014-07-05 NOTE — Progress Notes (Signed)
  POD # 1 bipolar right hip   VS BP 134/65 mmHg  Pulse 65  Temp(Src) 98.7 F (37.1 C) (Oral)  Resp 14  Ht 5\' 1"  (1.549 m)  Wt 150 lb (68.04 kg)  BMI 28.36 kg/m2  SpO2 100%   LABS  Hemoglobin & Hematocrit     Component Value Date/Time   HGB 10.4* 07/05/2014 0659   HCT 32.5* 07/05/2014 0659     DRESSING dry  Neuro-vasculo-motor status of operative limb could not assess very sedate   A/P  Pain control try not to over sedate  PT if they can come in  Start DVT prophylaxis

## 2014-07-05 NOTE — Evaluation (Signed)
Physical Therapy Evaluation Patient Details Name: Sanjuana MaeMary E Whack MRN: [id number] DOB: 02/19/1921 Today's Date: 07/05/2014   History of Present Illness  History of Present Illness:This is a 81 y.o. year old female with significant past medical history of dementia, ureteral calculi s/p lithotripsy and stent 06/26/14, hypothyroidism, HTN presenting with hip fracture, mechanical fall, UTI. Level V caveat as pt is confused.  Pt resident of local SNF  Clinical Impression  Pt is very lethargic at this time.  She will open her eyes with a sternal rub or pinching of ear lobe but quickly goes back to sleep.    Follow Up Recommendations SNF    Equipment Recommendations  None recommended by PT    Recommendations for Other Services   none  Precautions / Restrictions Precautions Precautions: Anterior Hip;Fall Restrictions Weight Bearing Restrictions: Yes RLE Weight Bearing: Weight bearing as tolerated      Mobility  Bed Mobility Overal bed mobility: Needs Assistance Bed Mobility: Rolling Rolling: Max assist         General bed mobility comments: Pt still not waking so therapist did not bring pt to sitting postition                Pertinent Vitals/Pain Pain Assessment: Faces Pain Score: 6  (with motion of Rt hip; pt quickly goes back to sleep when motion is stopped) Pain Intervention(s): Repositioned    Home Living Family/patient expects to be discharged to:: Skilled nursing facility                      Prior Function Level of Independence: Needs assistance   Gait / Transfers Assistance Needed: Pt ambulating with therapywith rolling walker at SNF.  Pt had kidney stone surgery a week ago.              Extremity/Trunk Assessment   Upper Extremity Assessment: Defer to OT evaluation           Lower Extremity Assessment: Difficult to assess due to impaired cognition (Pt is very lethargic wakes for only short periods of time then goes right back to sleep.)            Cognition Arousal/Alertness: Lethargic   Overall Cognitive Status: History of cognitive impairments - at baseline                            Exercises Total Joint Exercises Ankle Circles/Pumps: PROM;Both;10 reps Heel Slides: PROM;Both;5 reps      Assessment/Plan    PT Assessment Patient needs continued PT services  PT Diagnosis Difficulty walking;Acute pain;Altered mental status   PT Problem List Decreased strength;Decreased activity tolerance;Decreased balance;Pain;Decreased range of motion;Decreased mobility;Decreased coordination  PT Treatment Interventions Gait training;Therapeutic exercise;Therapeutic activities;Functional mobility training;DME instruction   PT Goals (Current goals can be found in the Care Plan section)      Frequency Min 5X/week           End of Session   Activity Tolerance: Patient limited by lethargy Patient left: in bed;with call bell/phone within reach;with family/visitor present           Time: [id number] PT Time Calculation (min) (ACUTE ONLY): 34 min   Charges:   PT Evaluation $Initial PT Evaluation Tier I: 1 Procedure     PT G Codes:        RUSSELL,CINDY 07/05/2014, 11:51 AM

## 2014-07-05 NOTE — Progress Notes (Signed)
Subjective: The patient is lethargic. This is 1 day postop following partial right hip replacement bipolar prosthesis under general anesthesia  Objective: Vital signs in last 24 hours: Temp:  [96.8 F (36 C)-98.7 F (37.1 C)] 98.7 F (37.1 C) (01/23 0545) Pulse Rate:  [65-79] 65 (01/23 0545) Resp:  [10-24] 14 (01/23 0545) BP: (104-153)/(52-80) 134/65 mmHg (01/23 0545) SpO2:  [93 %-100 %] 100 % (01/23 0711) Weight change:     Intake/Output from previous day: 01/22 0701 - 01/23 0700 In: 920 [P.O.:120; I.V.:800] Out: 1115 [Urine:1025; Blood:90] Intake/Output this shift: Total I/O In: 120 [P.O.:120] Out: -   Physical Exam: Gen. appearance the patient is lethargic  HEENT negative  Neck supple no JVD or thyroid abnormalities  Heart regular rhythm no murmurs  Lungs clear to P&A  Abdomen the palpable organs or masses  Surgical incisions over the right hip    Recent Labs  07/04/14 0615 07/05/14 0659  WBC 9.3 9.6  HGB 11.7* 10.4*  HCT 36.9 32.5*  PLT 258 214   BMET  Recent Labs  07/04/14 0615 07/05/14 0659  NA 141 139  K 5.0 5.1  CL 113* 114*  CO2 23 20  GLUCOSE 112* 112*  BUN 37* 30*  CREATININE 1.85* 1.61*  CALCIUM 8.9 8.5    Studies/Results: Dg Pelvis Portable  07/04/2014   CLINICAL DATA:  Status post right hip replacement  EXAM: PORTABLE PELVIS 1-2 VIEWS  COMPARISON:  07/03/2014  FINDINGS: A knee right hip replacement is seen. The prosthesis is well seated within the acetabulum. Bilateral ureteral stents are noted. No acute abnormality is seen.  IMPRESSION: Status post right hip replacement.   Electronically Signed   By: Alcide CleverMark  Lukens M.D.   On: 07/04/2014 12:28    Medications:  . acetaminophen  1,000 mg Intravenous Once  . cefTRIAXone (ROCEPHIN)  IV  1 g Intravenous Q24H  . docusate sodium  100 mg Oral BID  . enoxaparin (LOVENOX) injection  30 mg Subcutaneous Q24H  . gabapentin  600 mg Oral QHS  . levothyroxine  100 mcg Oral QAC breakfast  .  senna  1 tablet Oral BID  . traMADol  50 mg Oral 4 times per day  . trimethoprim  100 mg Oral QHS  . trypsin-balsam-castor oil  1 application Topical TID    . sodium chloride 100 mL/hr at 07/04/14 1522     Assessment/Plan: 1. Right femoral neck fracture-1 day postop following surgery for partial right hip replacement bipolar prosthesis-plan to continue IV Rocephin, IV fluids  2. Recent surgery for ureteral stones with lithotripsy and stent placement   LOS: 2 days   Petros Ahart G 07/05/2014, 10:52 AM

## 2014-07-06 LAB — CBC WITH DIFFERENTIAL/PLATELET
BASOS ABS: 0.1 10*3/uL (ref 0.0–0.1)
BASOS PCT: 1 % (ref 0–1)
EOS PCT: 3 % (ref 0–5)
Eosinophils Absolute: 0.3 10*3/uL (ref 0.0–0.7)
HEMATOCRIT: 33.3 % — AB (ref 36.0–46.0)
Hemoglobin: 10.5 g/dL — ABNORMAL LOW (ref 12.0–15.0)
Lymphocytes Relative: 17 % (ref 12–46)
Lymphs Abs: 1.8 10*3/uL (ref 0.7–4.0)
MCH: 28.8 pg (ref 26.0–34.0)
MCHC: 31.5 g/dL (ref 30.0–36.0)
MCV: 91.2 fL (ref 78.0–100.0)
Monocytes Absolute: 1.1 10*3/uL — ABNORMAL HIGH (ref 0.1–1.0)
Monocytes Relative: 11 % (ref 3–12)
NEUTROS ABS: 7.2 10*3/uL (ref 1.7–7.7)
Neutrophils Relative %: 68 % (ref 43–77)
Platelets: 219 10*3/uL (ref 150–400)
RBC: 3.65 MIL/uL — ABNORMAL LOW (ref 3.87–5.11)
RDW: 14.8 % (ref 11.5–15.5)
WBC: 10.6 10*3/uL — AB (ref 4.0–10.5)

## 2014-07-06 LAB — COMPREHENSIVE METABOLIC PANEL
ALT: 17 U/L (ref 0–35)
ANION GAP: 8 (ref 5–15)
AST: 29 U/L (ref 0–37)
Albumin: 3 g/dL — ABNORMAL LOW (ref 3.5–5.2)
Alkaline Phosphatase: 104 U/L (ref 39–117)
BUN: 28 mg/dL — AB (ref 6–23)
CHLORIDE: 116 mmol/L — AB (ref 96–112)
CO2: 16 mmol/L — ABNORMAL LOW (ref 19–32)
Calcium: 8.5 mg/dL (ref 8.4–10.5)
Creatinine, Ser: 1.75 mg/dL — ABNORMAL HIGH (ref 0.50–1.10)
GFR calc Af Amer: 28 mL/min — ABNORMAL LOW (ref >90)
GFR calc non Af Amer: 24 mL/min — ABNORMAL LOW (ref >90)
Glucose, Bld: 79 mg/dL (ref 70–99)
Potassium: 5.5 mmol/L — ABNORMAL HIGH (ref 3.5–5.1)
Sodium: 140 mmol/L (ref 135–145)
TOTAL PROTEIN: 6 g/dL (ref 6.0–8.3)
Total Bilirubin: 0.3 mg/dL (ref 0.3–1.2)

## 2014-07-06 LAB — GLUCOSE, CAPILLARY
Glucose-Capillary: 76 mg/dL (ref 70–99)
Glucose-Capillary: 82 mg/dL (ref 70–99)
Glucose-Capillary: 99 mg/dL (ref 70–99)

## 2014-07-06 MED ORDER — DEXTROSE-NACL 5-0.9 % IV SOLN
INTRAVENOUS | Status: DC
  Administered 2014-07-06: 22:00:00 via INTRAVENOUS

## 2014-07-06 MED ORDER — TRAMADOL HCL 50 MG PO TABS: 50.0000 mg | ORAL_TABLET | Freq: Four times a day (QID) | ORAL | Status: DC | PRN

## 2014-07-06 MED ORDER — DEXTROSE 5 % IV SOLN
INTRAVENOUS | Status: AC
  Filled 2014-07-06: qty 10

## 2014-07-06 MED ORDER — INSULIN REGULAR HUMAN 100 UNIT/ML IJ SOLN
Freq: Once | INTRAVENOUS | Status: AC
  Administered 2014-07-06: 13:00:00 via INTRAVENOUS
  Filled 2014-07-06: qty 1000

## 2014-07-06 NOTE — Progress Notes (Signed)
  POD # 2 right bipolar replacement   VS BP 130/62 mmHg  Pulse 70  Temp(Src) 98.6 F (37 C) (Axillary)  Resp 18  Ht 5\' 1"  (1.549 m)  Wt 150 lb (68.04 kg)  BMI 28.36 kg/m2  SpO2 96%   LABS  BMET    Component Value Date/Time   NA 140 07/06/2014 0546   K 5.5* 07/06/2014 0546   CL 116* 07/06/2014 0546   CO2 16* 07/06/2014 0546   GLUCOSE 79 07/06/2014 0546   BUN 28* 07/06/2014 0546   CREATININE 1.75* 07/06/2014 0546   CALCIUM 8.5 07/06/2014 0546   GFRNONAA 24* 07/06/2014 0546   GFRAA 28* 07/06/2014 0546   CBC Latest Ref Rng 07/06/2014 07/05/2014 07/04/2014  WBC 4.0 - 10.5 K/uL 10.6(H) 9.6 9.3  Hemoglobin 12.0 - 15.0 g/dL 10.5(L) 10.4(L) 11.7(L)  Hematocrit 36.0 - 46.0 % 33.3(L) 32.5(L) 36.9  Platelets 150 - 400 K/uL 219 214 258   DRESSING dry  Neuro-vasculo-motor status of operative limb could not assess patient's sedated  A/P extreme sedation patient sleeping most of the day. The patient is taken off all sedating medications and we will use tramadol on a when necessary basis to try to get her to wake up

## 2014-07-06 NOTE — Progress Notes (Signed)
Pt had not voided since foley removed at 0830, bladder scan done and it showed around 350 in the bladder, Dr. Romeo AppleHarrison notified, orders to do I&O cath, which only yielded 45cc's yellow, cloudy, foul smelling urine.

## 2014-07-06 NOTE — Progress Notes (Signed)
Subjective: The patient is lethargic. She is 2 days postop following partial right hip replacement bipolar prosthesis under general anesthesia Gen. appearance the patient is lethargic Physical examination                        Objective: Vital signs in last 24 hours: Temp:  [98.6 F (37 C)] 98.6 F (37 C) (01/23 1429) Pulse Rate:  [58-70] 70 (01/24 0625) Resp:  [14-18] 18 (01/24 0400) BP: (106-130)/(40-62) 130/62 mmHg (01/24 0625) SpO2:  [90 %-96 %] 96 % (01/24 0400) Weight change:     Intake/Output from previous day: 01/23 0701 - 01/24 0700 In: 2763.3 [P.O.:120; I.V.:2493.3; IV Piggyback:150] Out: 240 [Urine:240] Intake/Output this shift:    Physical Exam: Gen. appearance the patient remains lethargic  HEENT negative  Neck supple no JVD or thyroid abnormalities  Heart regular rhythm no murmurs  Lungs clear to P&A  Abdomen no palpable organs or masses  Surgical incision over the right hip   Recent Labs  07/05/14 0659 07/06/14 0546  WBC 9.6 10.6*  HGB 10.4* 10.5*  HCT 32.5* 33.3*  PLT 214 219   BMET  Recent Labs  07/05/14 0659 07/06/14 0546  NA 139 140  K 5.1 5.5*  CL 114* 116*  CO2 20 16*  GLUCOSE 112* 79  BUN 30* 28*  CREATININE 1.61* 1.75*  CALCIUM 8.5 8.5    Studies/Results: Dg Pelvis Portable  07/04/2014   CLINICAL DATA:  Status post right hip replacement  EXAM: PORTABLE PELVIS 1-2 VIEWS  COMPARISON:  07/03/2014  FINDINGS: A knee right hip replacement is seen. The prosthesis is well seated within the acetabulum. Bilateral ureteral stents are noted. No acute abnormality is seen.  IMPRESSION: Status post right hip replacement.   Electronically Signed   By: Alcide CleverMark  Lukens M.D.   On: 07/04/2014 12:28    Medications:  . cefTRIAXone (ROCEPHIN)  IV  1 g Intravenous Q24H  . docusate sodium  100 mg Oral BID  . enoxaparin (LOVENOX) injection  30 mg Subcutaneous Q24H  . gabapentin  600 mg Oral QHS  . levothyroxine  100 mcg Oral  QAC breakfast  . senna  1 tablet Oral BID  . traMADol  50 mg Oral 4 times per day  . trimethoprim  100 mg Oral QHS  . trypsin-balsam-castor oil  1 application Topical TID    . sodium chloride 100 mL/hr at 07/05/14 1624     Assessment/Plan: 1. Right femoral neck fracture-2 days postop following surgery for partial right hip replacement bipolar prosthesis-plan to continue current medications.  2. Recent surgery for ureteral stones with lithotripsy and stent placement   LOS: 3 days   Caralee Morea G 07/06/2014, 8:53 AM

## 2014-07-07 ENCOUNTER — Encounter (HOSPITAL_COMMUNITY): Payer: Self-pay | Admitting: Orthopedic Surgery

## 2014-07-07 LAB — TYPE AND SCREEN
ABO/RH(D): O NEG
Antibody Screen: NEGATIVE
Unit division: 0
Unit division: 0
Unit division: 0

## 2014-07-07 LAB — COMPREHENSIVE METABOLIC PANEL
ALK PHOS: 87 U/L (ref 39–117)
ALT: 11 U/L (ref 0–35)
AST: 16 U/L (ref 0–37)
Albumin: 2.5 g/dL — ABNORMAL LOW (ref 3.5–5.2)
Anion gap: 4 — ABNORMAL LOW (ref 5–15)
BILIRUBIN TOTAL: 0.5 mg/dL (ref 0.3–1.2)
BUN: 23 mg/dL (ref 6–23)
CO2: 18 mmol/L — AB (ref 19–32)
Calcium: 8.2 mg/dL — ABNORMAL LOW (ref 8.4–10.5)
Chloride: 117 mmol/L — ABNORMAL HIGH (ref 96–112)
Creatinine, Ser: 1.58 mg/dL — ABNORMAL HIGH (ref 0.50–1.10)
GFR calc Af Amer: 32 mL/min — ABNORMAL LOW (ref >90)
GFR, EST NON AFRICAN AMERICAN: 27 mL/min — AB (ref >90)
Glucose, Bld: 127 mg/dL — ABNORMAL HIGH (ref 70–99)
Potassium: 4.6 mmol/L (ref 3.5–5.1)
Sodium: 139 mmol/L (ref 135–145)
Total Protein: 5.4 g/dL — ABNORMAL LOW (ref 6.0–8.3)

## 2014-07-07 LAB — CBC WITH DIFFERENTIAL/PLATELET
BASOS PCT: 0 % (ref 0–1)
Basophils Absolute: 0 10*3/uL (ref 0.0–0.1)
EOS PCT: 2 % (ref 0–5)
Eosinophils Absolute: 0.1 10*3/uL (ref 0.0–0.7)
HEMATOCRIT: 28.9 % — AB (ref 36.0–46.0)
Hemoglobin: 9.3 g/dL — ABNORMAL LOW (ref 12.0–15.0)
LYMPHS ABS: 1.3 10*3/uL (ref 0.7–4.0)
LYMPHS PCT: 15 % (ref 12–46)
MCH: 28.8 pg (ref 26.0–34.0)
MCHC: 32.2 g/dL (ref 30.0–36.0)
MCV: 89.5 fL (ref 78.0–100.0)
Monocytes Absolute: 0.8 10*3/uL (ref 0.1–1.0)
Monocytes Relative: 10 % (ref 3–12)
NEUTROS ABS: 6.3 10*3/uL (ref 1.7–7.7)
Neutrophils Relative %: 73 % (ref 43–77)
PLATELETS: ADEQUATE 10*3/uL (ref 150–400)
RBC: 3.23 MIL/uL — AB (ref 3.87–5.11)
RDW: 14.4 % (ref 11.5–15.5)
Smear Review: ADEQUATE
WBC: 8.5 10*3/uL (ref 4.0–10.5)

## 2014-07-07 MED ORDER — TRAMADOL HCL 50 MG PO TABS: 50.0000 mg | ORAL_TABLET | Freq: Four times a day (QID) | ORAL | Status: DC | PRN

## 2014-07-07 NOTE — Progress Notes (Signed)
Subjective: 3 Days Post-Op Procedure(s) (LRB): RIGHT PARTIAL HIP REPLACEMENT (Right) Patient reports pain as cant assess.    Objective: BP 152/86 mmHg  Pulse 71  Temp(Src) 98.5 F (36.9 C) (Axillary)  Resp 18  Ht 5\' 1"  (1.549 m)  Wt 150 lb (68.04 kg)  BMI 28.36 kg/m2  SpO2 97%  No results for input(s): LABPT, INR in the last 72 hours.  cant assess  Assessment/Plan: 3 Days Post-Op Procedure(s) (LRB): RIGHT PARTIAL HIP REPLACEMENT (Right)  Discharge instructions Lateral approach precautions Weight bearing as tolerated  Staples out POD 14 F/u 1 month    Fuller CanadaStanley Bliss [name] 07/07/2014, 7:55 AM

## 2014-07-07 NOTE — Clinical Social Work Note (Signed)
[facility] Pt's family at bedside and facility aware and agreeable. D/C summary to be faxed upon completion. Pt will transfer with RN.  Derenda FennelKara Booker [place], [place] [phone]

## 2014-07-07 NOTE — Progress Notes (Signed)
Pt's K+ 5.5 this am, Dr. Karilyn CotaGosrani notified, orders received to give D5NS with 10 units Novolog added x 1 L at 100cc/hr, IV started, will monitor blood sugars every 4 hours while receiving IVF's with insulin.

## 2014-07-07 NOTE — Discharge Summary (Signed)
Physician Discharge Summary  [name] WJX:[id number] DOB: [birth year] DOA: 07/03/2014  PCP: [name], MD  Admit date: 07/03/2014 Discharge date: 07/07/2014     Discharge Diagnoses:  1. Right femoral neck fracture-surgery right hip replacement bipolar prosthesis 2. Recent removal of ureteral stones with lithotripsy and stent placement 3. Hypothyroidism 4. Essential hypertension 5.   Discharge Condition: Stable Disposition: The patient was discharged to nursing facility  Diet recommendation: Regular  Filed Weights   07/03/14 0112 07/03/14 0558 07/04/14 0934  Weight: 70.761 kg (156 lb) 68.5 kg (151 lb 0.2 oz) 68.04 kg (150 lb)    History of present illness:  The patient was admitted to the hospital from nursing facility after having experienced a fall there with fractured right hip. She was x-rayed in ED and found to have right femoral neck fracture. She recently had lithotripsy for ureteral stones and stent placement in right ureter  Hospital Course:  The patient was admitted to MedSurg bed was started on intravenous fluids D5 half normal saline and IV Rocephin 1 g daily. She is seen in consultation by orthopedist Dr. Romeo Apple. She subsequently had undergone general anesthesia partial right hip replacement bipolar prosthesis and tolerated this in satisfactory manner. It was felt that she could be discharged to nursing facility with lateral approach precautions weightbearing as tolerated. She is to continue medications listed below.   Discharge Instructions The patient is to be discharged to nursing facility. She will proceed with rehabilitation there.    Medication List    TAKE these medications        gabapentin 600 MG tablet  Commonly known as:  NEURONTIN  Take 600 mg by mouth at bedtime.     ipratropium-albuterol 0.5-2.5 (3) MG/3ML Soln  Commonly known as:  DUONEB  Take 3 mLs by nebulization every 6 (six) hours as needed (Shortness of breath).     levothyroxine  100 MCG tablet  Commonly known as:  SYNTHROID, LEVOTHROID  Take 100 mcg by mouth daily before breakfast.     LORazepam 0.5 MG tablet  Commonly known as:  ATIVAN  Take 0.5 mg by mouth every 4 (four) hours as needed for anxiety (agitation). Unsure of dosage     PAIN RELIEVER 325 MG tablet  Generic drug:  acetaminophen  Take 650 mg by mouth every 6 (six) hours as needed for mild pain or moderate pain.     traMADol 50 MG tablet  Commonly known as:  ULTRAM  Take 1 tablet (50 mg total) by mouth every 6 (six) hours as needed for moderate pain.     trimethoprim 100 MG tablet  Commonly known as:  TRIMPEX  Take 1 tablet (100 mg total) by mouth at bedtime.     VENELEX Oint  Apply 1 application topically 3 (three) times daily. Applied to buttock.       Allergies  Allergen Reactions  . Lactulose     REACTION: unknown reaction  . Penicillins     REACTION: unknown reaction  . Prilosec [Omeprazole] Itching and Rash    The results of significant diagnostics from this hospitalization (including imaging, microbiology, ancillary and laboratory) are listed below for reference.    Significant Diagnostic Studies: Dg Chest 1 View  07/03/2014   CLINICAL DATA:  Fall at nursing  EXAM: CHEST - 1 VIEW  COMPARISON:  05/24/2014  FINDINGS: There is mild cardiomegaly which is stable from prior. The right hilum is prominent, as seen on previous studies, likely from mediastinal rotation. No mass is  seen on abdominal CT 05/20/2014 which encompasses much of the right hilum. There is aortic tortuosity which is unchanged. There is no edema, consolidation, effusion, or pneumothorax. Chronic mild elevation of the left diaphragm with overlying atelectasis or scar. No appreciable fracture.  IMPRESSION: No active disease.   Electronically Signed   By: Tiburcio PeaJonathan  Watts M.D.   On: 07/03/2014 02:22   Dg Thoracic Spine W/swimmers  06/23/2014   CLINICAL DATA:  Status post fall. Acute onset of right upper back pain. Initial  encounter.  EXAM: THORACIC SPINE - 2 VIEW + SWIMMERS  COMPARISON:  Chest radiograph from 05/24/2014  FINDINGS: There is no evidence of fracture or subluxation. Vertebral bodies demonstrate normal height and alignment. Multilevel vacuum phenomenon is noted along the thoracic spine. The lower cervical spine is difficult to fully assess on this study.  The visualized portions of both lungs are clear. Apparent right hilar prominence seems to be positional in nature, on correlation with prior studies. Apparent bilateral ureteral stents are noted.  IMPRESSION: No evidence of fracture or subluxation along the thoracic spine.   Electronically Signed   By: Roanna RaiderJeffery  Chang M.D.   On: 06/23/2014 22:36   Dg Lumbar Spine Complete  06/23/2014   CLINICAL DATA:  Fall, confusion, uncertain how fell, RIGHT hip and RIGHT low back pain  EXAM: LUMBAR SPINE - COMPLETE 4+ VIEW  COMPARISON:  CT abdomen pelvis 05/20/2014  FINDINGS: BILATERAL ureteral stents and ureteral calculi again identified.  12 x 9 mm diameter proximal RIGHT ureteral calculus at the level of the RIGHT iliac crest.  23 x 10 mm distal LEFT ureteral calculus in LEFT pelvis.  Bones demineralized.  Five non-rib-bearing lumbar vertebrae.  Scattered disc space narrowing and endplate spur formation.  Multilevel facet degenerative changes.  Grade 1 anterolisthesis L4-L5 similar to prior CT.  Minimal anterior height loss T12 unchanged.  Vertebral body heights appear otherwise maintained without definite fracture or subluxation.  SI joints symmetric.  No gross spondylolysis.  IMPRESSION: Degenerative disc and facet disease changes lumbar spine with persistent grade 1 anterolisthesis L4-L5.  No definite acute lumbar spine abnormalities.  Again identified BILATERAL ureteral stents and BILATERAL large ureteral calculi.   Electronically Signed   By: Ulyses SouthwardMark  Boles M.D.   On: 06/23/2014 22:43   Ct Head Wo Contrast  07/03/2014   CLINICAL DATA:  Fall from wheelchair with right hip  fracture. Initial encounter  EXAM: CT HEAD WITHOUT CONTRAST  CT CERVICAL SPINE WITHOUT CONTRAST  TECHNIQUE: Multidetector CT imaging of the head and cervical spine was performed following the standard protocol without intravenous contrast. Multiplanar CT image reconstructions of the cervical spine were also generated.  COMPARISON:  06/24/2014 head CT  FINDINGS: CT HEAD FINDINGS  Skull and Sinuses:No acute fracture or destructive process.  There is chronic focal mucosal thickening in a right posterior ethmoid air cell. No sinus effusion.  Orbits: Bilateral cataract resection.  No traumatic findings.  Brain: No evidence of acute infarction, hemorrhage, hydrocephalus, or mass lesion/mass effect.  There is generalized brain atrophy with ex vacuo ventricular enlargement. Chronic small-vessel ischemic disease with gliosis most confluent around the frontal horns of the lateral ventricles.  CT CERVICAL SPINE FINDINGS  No acute fracture or traumatic malalignment. A lucency through the right superior articular facet of C2 is chronic based on previous imaging. Apparent cortical lucency through the dens is motion artifact. There is multilevel facet and disc degeneration without high-grade osseous canal or foraminal stenosis.  IMPRESSION: 1. No evidence of acute intracranial  or cervical spine injury. 2. Brain atrophy and chronic small vessel disease.   Electronically Signed   By: Tiburcio Pea M.D.   On: 07/03/2014 02:51   Ct Head Wo Contrast  06/24/2014   CLINICAL DATA:  Fall, head injury.  Hit right eye.  Weakness.  EXAM: CT HEAD WITHOUT CONTRAST  TECHNIQUE: Contiguous axial images were obtained from the base of the skull through the vertex without intravenous contrast.  COMPARISON:  03/11/2014  FINDINGS: Generalized atrophy and mild chronic small vessel ischemia, stable from prior. No intracranial hemorrhage, mass effect, or midline shift. No hydrocephalus. The basilar cisterns are patent. No evidence of territorial  infarct. No intracranial fluid collection. Calvarium is intact. Chronic opacification of right mid ethmoid air cell. Paranasal sinuses are otherwise well-aerated. Minimal skin thickening in the right perirectal soft tissues.  IMPRESSION: No acute intracranial abnormality. Stable atrophy and chronic small vessel ischemic change.   Electronically Signed   By: Rubye Oaks M.D.   On: 06/24/2014 02:30   Ct Cervical Spine Wo Contrast  07/03/2014   CLINICAL DATA:  Fall from wheelchair with right hip fracture. Initial encounter  EXAM: CT HEAD WITHOUT CONTRAST  CT CERVICAL SPINE WITHOUT CONTRAST  TECHNIQUE: Multidetector CT imaging of the head and cervical spine was performed following the standard protocol without intravenous contrast. Multiplanar CT image reconstructions of the cervical spine were also generated.  COMPARISON:  06/24/2014 head CT  FINDINGS: CT HEAD FINDINGS  Skull and Sinuses:No acute fracture or destructive process.  There is chronic focal mucosal thickening in a right posterior ethmoid air cell. No sinus effusion.  Orbits: Bilateral cataract resection.  No traumatic findings.  Brain: No evidence of acute infarction, hemorrhage, hydrocephalus, or mass lesion/mass effect.  There is generalized brain atrophy with ex vacuo ventricular enlargement. Chronic small-vessel ischemic disease with gliosis most confluent around the frontal horns of the lateral ventricles.  CT CERVICAL SPINE FINDINGS  No acute fracture or traumatic malalignment. A lucency through the right superior articular facet of C2 is chronic based on previous imaging. Apparent cortical lucency through the dens is motion artifact. There is multilevel facet and disc degeneration without high-grade osseous canal or foraminal stenosis.  IMPRESSION: 1. No evidence of acute intracranial or cervical spine injury. 2. Brain atrophy and chronic small vessel disease.   Electronically Signed   By: Tiburcio Pea M.D.   On: 07/03/2014 02:51   Dg  Pelvis Portable  07/04/2014   CLINICAL DATA:  Status post right hip replacement  EXAM: PORTABLE PELVIS 1-2 VIEWS  COMPARISON:  07/03/2014  FINDINGS: A knee right hip replacement is seen. The prosthesis is well seated within the acetabulum. Bilateral ureteral stents are noted. No acute abnormality is seen.  IMPRESSION: Status post right hip replacement.   Electronically Signed   By: Alcide Clever M.D.   On: 07/04/2014 12:28   Dg Knee Complete 4 Views Right  07/03/2014   CLINICAL DATA:  Fall at nursing home.  Initial encounter.  EXAM: RIGHT KNEE - COMPLETE 4+ VIEW  COMPARISON:  01/25/2014  FINDINGS: There is a moderate knee joint effusion, stable to mildly increased from prior. No acute fracture or malalignment is detected. There is tricompartmental osteoarthritis with joint narrowing advanced in the medial compartment. There may be intra-articular debris in the suprapatellar joint fluid.  IMPRESSION: 1. No acute osseous findings. 2. Tricompartmental osteoarthritis and chronic knee joint effusion.   Electronically Signed   By: Tiburcio Pea M.D.   On: 07/03/2014 02:20  Dg Hip Unilat With Pelvis 2-3 Views Right  07/03/2014   CLINICAL DATA:  Fall at nursing home with hip pain. Initial encounter  EXAM: DG HIP W/ PELVIS 2-3V*R*  COMPARISON:  06/24/2014  FINDINGS: There is an acute right femoral neck fracture with varus angulation. The right hip remains located. No significant hip degeneration.  The pelvic ring is intact.  The left hip is located and intact.  IMPRESSION: Impacted right femoral neck fracture.   Electronically Signed   By: Tiburcio Pea M.D.   On: 07/03/2014 02:18   Dg Hip Unilat With Pelvis 2-3 Views Right  06/24/2014   CLINICAL DATA:  81 year old female with pain after fall.  EXAM: DG HIP W/ PELVIS 2-3V*R*  COMPARISON:  Radiographs 1 day prior.  FINDINGS: Cortical margins of the bony pelvis are intact. No acute fracture. Both femoral heads are well-seated in respective acetabula. Pubic  symphysis and sacroiliac joints are congruent. Bilateral nephro ureteral stents of stones along the course of the distal left ureter stent, unchanged from prior.  IMPRESSION: No pelvic fracture.   Electronically Signed   By: Rubye Oaks M.D.   On: 06/24/2014 02:14   Dg Hip Unilat With Pelvis 2-3 Views Right  06/23/2014   CLINICAL DATA:  Status post fall. Acute onset of right hip pain. Initial encounter.  EXAM: DG HIP W/ PELVIS 2-3V*R*  COMPARISON:  None.  FINDINGS: There is no evidence of fracture or dislocation. Both femoral heads are seated normally within their respective acetabula. The proximal right femur appears intact. No significant degenerative change is appreciated. The sacroiliac joints are unremarkable in appearance.  The visualized bowel gas pattern is grossly unremarkable in appearance. Scattered phleboliths are noted within the pelvis. Bilateral ureteral stents are partially imaged. A large rounded calcification at the left hemipelvis is nonspecific in appearance.  IMPRESSION: No evidence of fracture or dislocation.   Electronically Signed   By: Roanna Raider M.D.   On: 06/23/2014 22:37    Microbiology: Recent Results (from the past 240 hour(s))  Urine culture     Status: None   Collection Time: 07/03/14  3:15 AM  Result Value Ref Range Status   Specimen Description URINE, CLEAN CATCH  Final   Special Requests NONE  Final   Colony Count NO GROWTH Performed at [facility]   Final   Culture NO GROWTH Performed at [facility]   Final   Report Status 07/04/2014 FINAL  Final  Surgical pcr screen     Status: None   Collection Time: 07/04/14  8:15 AM  Result Value Ref Range Status   MRSA, PCR NEGATIVE NEGATIVE Final   Staphylococcus aureus NEGATIVE NEGATIVE Final    Comment:        The Xpert SA Assay (FDA approved for NASAL specimens in patients over 51 years of age), is one component of a comprehensive surveillance program.  Test performance has been  validated by [facility] for patients greater than or equal to 31 year old. It is not intended to diagnose infection nor to guide or monitor treatment.      Labs: Basic Metabolic Panel:  Recent Labs Lab 07/03/14 0524 07/04/14 0615 07/05/14 0659 07/06/14 0546 07/07/14 0609  NA 139 141 139 140 139  K 5.1 5.0 5.1 5.5* 4.6  CL 110 113* 114* 116* 117*  CO2 23 23 20  16* 18*  GLUCOSE 126* 112* 112* 79 127*  BUN 44* 37* 30* 28* 23  CREATININE 2.19* 1.85* 1.61* 1.75* 1.58*  CALCIUM 8.9 8.9 8.5 8.5 8.2*   Liver Function Tests:  Recent Labs Lab 07/03/14 0524 07/04/14 0615 07/05/14 0659 07/06/14 0546 07/07/14 0609  AST ALT ALKPHOS 65 62 61 104 87  BILITOT 0.3 0.8 0.6 0.3 0.5  PROT 6.7 6.6 5.9* 6.0 5.4*  ALBUMIN 3.7 3.4* 3.1* 3.0* 2.5*   No results for input(s): LIPASE, AMYLASE in the last 168 hours. No results for input(s): AMMONIA in the last 168 hours. CBC:  Recent Labs Lab 07/03/14 0524 07/04/14 0615 07/05/14 0659 07/06/14 0546 07/07/14 0609  WBC 11.3* 9.3 9.6 10.6* 8.5  NEUTROABS 8.0* 5.4 7.1 7.2 6.3  HGB 10.4* 11.7* 10.4* 10.5* 9.3*  HCT 32.0* 36.9 32.5* 33.3* 28.9*  MCV 89.6 89.3 90.0 91.2 89.5  PLT 262 258 214 219 PLATELET CLUMPS NOTED ON SMEAR, COUNT APPEARS ADEQUATE   Cardiac Enzymes: No results for input(s): CKTOTAL, CKMB, CKMBINDEX, TROPONINI in the last 168 hours. BNP: BNP (last 3 results) No results for input(s): PROBNP in the last 8760 hours. CBG:  Recent Labs Lab 07/06/14 1711 07/06/14 2141 07/06/14 2331  GLUCAP 99 76 82    Active Problems:   Hip fracture   Fracture of femoral neck   Femoral neck fracture   Time coordinating discharge: 40 minutes  Signed:  Butch Penny, MD 07/07/2014, 1:23 PM

## 2014-07-07 NOTE — Progress Notes (Signed)
[facility] Dr[facility] Renard MatterMcInnis. Pt's IV site D/C'd and WDL. Pt's VSS. [facility] Verbalized understanding. [facility].

## 2014-07-07 NOTE — Progress Notes (Signed)
Subjective: The patient remains lethargic. She is 3 days postop following partial right hip replacement bipolar prosthesis 100 general anesthesia  Objective: Vital signs in last 24 hours: Temp:  [98.3 F (36.8 C)-99.2 F (37.3 C)] 98.5 F (36.9 C) (01/25 0400) Pulse Rate:  [69-71] 71 (01/25 0400) Resp:  [14-18] 18 (01/25 0400) BP: (152-161)/(49-86) 152/86 mmHg (01/25 0400) SpO2:  [95 %-99 %] 97 % (01/25 0400) Weight change:     Intake/Output from previous day: 01/24 0701 - 01/25 0700 In: 990 [P.O.:250; I.V.:740] Out: -  Intake/Output this shift:    Physical Exam: Gen. appearance the patient remains lethargic response to painful stimulus  HEENT negative  Neck supple no JVD or thyroid abnormalities  Heart regular rhythm no murmurs  Lungs clear to P&A  Abdomen the palpable organs or masses  Surgical incision over right hip   Recent Labs  07/05/14 0659 07/06/14 0546  WBC 9.6 10.6*  HGB 10.4* 10.5*  HCT 32.5* 33.3*  PLT 214 219   BMET  Recent Labs  07/05/14 0659 07/06/14 0546  NA 139 140  K 5.1 5.5*  CL 114* 116*  CO2 20 16*  GLUCOSE 112* 79  BUN 30* 28*  CREATININE 1.61* 1.75*  CALCIUM 8.5 8.5    Studies/Results: No results found.  Medications:  . cefTRIAXone (ROCEPHIN)  IV  1 g Intravenous Q24H  . docusate sodium  100 mg Oral BID  . enoxaparin (LOVENOX) injection  30 mg Subcutaneous Q24H  . gabapentin  600 mg Oral QHS  . levothyroxine  100 mcg Oral QAC breakfast  . senna  1 tablet Oral BID  . trimethoprim  100 mg Oral QHS  . trypsin-balsam-castor oil  1 application Topical TID    . dextrose 5 % and 0.9% NaCl 100 mL/hr at 07/06/14 2210     Assessment/Plan: 1. Right femoral neck fracture-3 days postop following surgery for partial right hip replacement bipolar prosthesis-continue current medications  2. Recent surgery for ureteral stones with lithotripsy and stent placement  3. Slight elevation of serum potassium-repeat chemistries   LOS: 4 days   Miller Limehouse G 07/07/2014, 6:30 AM

## 2014-07-07 NOTE — Progress Notes (Signed)
Patient lethargic, arouses with stimulation, Tramadol ordered to give at 1200, dose will be held due to lethargy.

## 2014-07-08 ENCOUNTER — Telehealth: Payer: Self-pay | Admitting: Orthopedic Surgery

## 2014-07-08 NOTE — Telephone Encounter (Signed)
Routing to Dr Harrison 

## 2014-07-08 NOTE — Telephone Encounter (Signed)
Call received from [facility] physical therapist, Stephenie AcresJackie Clemmons, requesting clarification on specific precautions and instructions for physical therapy; states there may be a discrepancy in the notes.  Please call her at ph# [phone]; fax# [phone]

## 2014-07-08 NOTE — Telephone Encounter (Signed)
ITS TOO L;ATE TO CALL TODAY   TELL HIM IT S WEIGHT BEARING AS TOLERETED  DIRECT LATERAL PRECAUTIONS  IF THAT'S NOT ENOUGH INFO   [place] [phone]

## 2014-07-09 NOTE — Telephone Encounter (Signed)
Therapist aware 

## 2014-07-25 ENCOUNTER — Telehealth: Payer: Self-pay | Admitting: *Deleted

## 2014-07-25 NOTE — Telephone Encounter (Signed)
SPOKE WITH NURSE [facility], SHE IS INQUIRING ABOUT STAPLE REMOVAL, PATIENT HAD RIGHT BIPOLAR HIP SURGERY 07/04/14 AND STAPLES HAVE NOT BEEN REMOVED, GAVE VERBAL ORDER TO REMOVE STAPLES TODAY AND SCHEDULED POST OP APPT FOR Monday 07/28/14.

## 2014-07-28 ENCOUNTER — Ambulatory Visit: Payer: Medicare Other | Admitting: Orthopedic Surgery

## 2014-07-29 ENCOUNTER — Ambulatory Visit (INDEPENDENT_AMBULATORY_CARE_PROVIDER_SITE_OTHER): Payer: Medicare Other | Admitting: Urology

## 2014-07-29 ENCOUNTER — Other Ambulatory Visit: Payer: Self-pay | Admitting: Urology

## 2014-07-29 ENCOUNTER — Ambulatory Visit: Payer: Medicare Other | Admitting: Orthopedic Surgery

## 2014-07-29 DIAGNOSIS — N201 Calculus of ureter: Secondary | ICD-10-CM

## 2014-07-29 DIAGNOSIS — T83511A Infection and inflammatory reaction due to indwelling urethral catheter, initial encounter: Principal | ICD-10-CM

## 2014-07-29 DIAGNOSIS — N390 Urinary tract infection, site not specified: Secondary | ICD-10-CM

## 2014-07-31 ENCOUNTER — Ambulatory Visit (INDEPENDENT_AMBULATORY_CARE_PROVIDER_SITE_OTHER): Payer: Self-pay | Admitting: Orthopedic Surgery

## 2014-07-31 ENCOUNTER — Encounter: Payer: Self-pay | Admitting: Orthopedic Surgery

## 2014-07-31 VITALS — BP 121/71 | Ht 61.0 in | Wt 150.0 lb

## 2014-07-31 DIAGNOSIS — S72001D Fracture of unspecified part of neck of right femur, subsequent encounter for closed fracture with routine healing: Secondary | ICD-10-CM

## 2014-07-31 NOTE — Progress Notes (Signed)
Chief Complaint  Patient presents with  . Follow-up    post op 1, right bipolar hip, DOS 07/04/14   BP 121/71 mmHg  Ht 5\' 1"  (1.549 m)  Wt 150 lb (68.04 kg)  BMI 28.36 kg/m2  Encounter Diagnosis  Name Primary?  . Fracture of femoral neck, right, closed, with routine healing, subsequent encounter Yes    The patient ambulates with a walker standby assist. Leg lengths are equal. Hip flexion is normal.  Follow-up 2 months continue therapy as ordered

## 2014-08-02 ENCOUNTER — Encounter (HOSPITAL_COMMUNITY)
Admission: RE | Admit: 2014-08-02 | Discharge: 2014-08-02 | Disposition: A | Discharge status: Home / Self Care | Payer: Medicare Other | Source: Other Acute Inpatient Hospital | Attending: Internal Medicine | Admitting: Internal Medicine

## 2014-08-02 LAB — CLOSTRIDIUM DIFFICILE BY PCR: CDIFFPCR: POSITIVE — AB

## 2014-08-08 ENCOUNTER — Ambulatory Visit (HOSPITAL_COMMUNITY)
Admission: RE | Admit: 2014-08-08 | Discharge: 2014-08-08 | Disposition: A | Discharge status: Home / Self Care | Payer: Medicare Other | Source: Ambulatory Visit | Attending: Urology | Admitting: Urology

## 2014-08-08 DIAGNOSIS — T8351XA Infection and inflammatory reaction due to indwelling urinary catheter, initial encounter: Secondary | ICD-10-CM | POA: Insufficient documentation

## 2014-08-08 DIAGNOSIS — N390 Urinary tract infection, site not specified: Secondary | ICD-10-CM

## 2014-08-08 DIAGNOSIS — N281 Cyst of kidney, acquired: Secondary | ICD-10-CM | POA: Diagnosis not present

## 2014-08-08 DIAGNOSIS — T83511A Infection and inflammatory reaction due to indwelling urethral catheter, initial encounter: Secondary | ICD-10-CM

## 2014-08-08 DIAGNOSIS — N1330 Unspecified hydronephrosis: Secondary | ICD-10-CM | POA: Diagnosis not present

## 2014-08-12 ENCOUNTER — Ambulatory Visit (INDEPENDENT_AMBULATORY_CARE_PROVIDER_SITE_OTHER): Payer: Medicare Other | Admitting: Urology

## 2014-08-12 DIAGNOSIS — N201 Calculus of ureter: Secondary | ICD-10-CM | POA: Diagnosis not present

## 2014-08-12 DIAGNOSIS — N390 Urinary tract infection, site not specified: Secondary | ICD-10-CM | POA: Diagnosis not present

## 2014-10-07 ENCOUNTER — Ambulatory Visit (INDEPENDENT_AMBULATORY_CARE_PROVIDER_SITE_OTHER): Payer: Medicare Other | Admitting: Orthopedic Surgery

## 2014-10-07 ENCOUNTER — Encounter: Payer: Self-pay | Admitting: Orthopedic Surgery

## 2014-10-07 VITALS — BP 136/72 | Ht 61.0 in | Wt 150.0 lb

## 2014-10-07 DIAGNOSIS — M129 Arthropathy, unspecified: Secondary | ICD-10-CM

## 2014-10-07 DIAGNOSIS — M1711 Unilateral primary osteoarthritis, right knee: Secondary | ICD-10-CM

## 2014-10-07 DIAGNOSIS — Z96641 Presence of right artificial hip joint: Secondary | ICD-10-CM | POA: Diagnosis not present

## 2014-10-07 DIAGNOSIS — S72001D Fracture of unspecified part of neck of right femur, subsequent encounter for closed fracture with routine healing: Secondary | ICD-10-CM

## 2014-10-07 NOTE — Progress Notes (Signed)
Subjective:     Patient ID: [name], female   DOB: 08-24-1920, 81 y.o.   MRN: [id number]  Chief Complaint  Patient presents with  . Follow-up    2 month follow up Right hip bipolar, DOS 07/04/14    HPI Long-standing history of osteoarthritis right knee previously treated with injection thought to be too old to have knee replacement and broke her left hip last January but in a bipolar she comes in with complaints of knee and leg pain no hip pain she is ambulatory with a walker at home  Review of Systems     Objective:   Physical Exam Intact knee extension flexion with slight flexion contracture mild tenderness in the Achilles tendon mild swelling and tenderness over the right knee joint hip flexion weakness noted knee in extension intact    Assessment:     Encounter Diagnoses  Name Primary?  . Fracture of femoral neck, right, closed, with routine healing, subsequent encounter   . Arthritis of knee, right   . History of hip replacement, total, right Yes        Plan:     Patient is not a surgical candidate she's tried injections without relief at this point her hip surgery she's done well she will continue home weight-bearing as tolerated with walker follow-up as needed

## 2014-11-18 ENCOUNTER — Ambulatory Visit (INDEPENDENT_AMBULATORY_CARE_PROVIDER_SITE_OTHER): Payer: Medicare Other | Admitting: Urology

## 2014-11-18 DIAGNOSIS — N390 Urinary tract infection, site not specified: Secondary | ICD-10-CM

## 2014-11-18 DIAGNOSIS — N200 Calculus of kidney: Secondary | ICD-10-CM | POA: Diagnosis not present

## 2015-07-19 ENCOUNTER — Encounter (HOSPITAL_COMMUNITY): Payer: Self-pay | Admitting: Emergency Medicine

## 2015-07-19 ENCOUNTER — Emergency Department (HOSPITAL_COMMUNITY): Payer: Medicare Other

## 2015-07-19 ENCOUNTER — Inpatient Hospital Stay (HOSPITAL_COMMUNITY)
Admission: EM | Admit: 2015-07-19 | Discharge: 2015-07-23 | DRG: 481 | Disposition: A | Discharge status: Skilled Nursing Facility | Payer: Medicare Other | Attending: Internal Medicine | Admitting: Internal Medicine

## 2015-07-19 DIAGNOSIS — N183 Chronic kidney disease, stage 3 unspecified: Secondary | ICD-10-CM | POA: Diagnosis present

## 2015-07-19 DIAGNOSIS — R338 Other retention of urine: Secondary | ICD-10-CM | POA: Diagnosis not present

## 2015-07-19 DIAGNOSIS — I10 Essential (primary) hypertension: Secondary | ICD-10-CM | POA: Diagnosis present

## 2015-07-19 DIAGNOSIS — I959 Hypotension, unspecified: Secondary | ICD-10-CM | POA: Diagnosis not present

## 2015-07-19 DIAGNOSIS — W1830XA Fall on same level, unspecified, initial encounter: Secondary | ICD-10-CM | POA: Diagnosis present

## 2015-07-19 DIAGNOSIS — D72829 Elevated white blood cell count, unspecified: Secondary | ICD-10-CM | POA: Diagnosis present

## 2015-07-19 DIAGNOSIS — Z66 Do not resuscitate: Secondary | ICD-10-CM | POA: Diagnosis present

## 2015-07-19 DIAGNOSIS — S72009A Fracture of unspecified part of neck of unspecified femur, initial encounter for closed fracture: Secondary | ICD-10-CM | POA: Diagnosis present

## 2015-07-19 DIAGNOSIS — I129 Hypertensive chronic kidney disease with stage 1 through stage 4 chronic kidney disease, or unspecified chronic kidney disease: Secondary | ICD-10-CM | POA: Diagnosis present

## 2015-07-19 DIAGNOSIS — E039 Hypothyroidism, unspecified: Secondary | ICD-10-CM | POA: Diagnosis present

## 2015-07-19 DIAGNOSIS — S72002A Fracture of unspecified part of neck of left femur, initial encounter for closed fracture: Secondary | ICD-10-CM | POA: Diagnosis present

## 2015-07-19 DIAGNOSIS — S72142A Displaced intertrochanteric fracture of left femur, initial encounter for closed fracture: Principal | ICD-10-CM | POA: Diagnosis present

## 2015-07-19 DIAGNOSIS — F05 Delirium due to known physiological condition: Secondary | ICD-10-CM | POA: Diagnosis present

## 2015-07-19 DIAGNOSIS — S72145A Nondisplaced intertrochanteric fracture of left femur, initial encounter for closed fracture: Secondary | ICD-10-CM

## 2015-07-19 DIAGNOSIS — E875 Hyperkalemia: Secondary | ICD-10-CM | POA: Diagnosis present

## 2015-07-19 DIAGNOSIS — F0390 Unspecified dementia without behavioral disturbance: Secondary | ICD-10-CM | POA: Diagnosis present

## 2015-07-19 DIAGNOSIS — D62 Acute posthemorrhagic anemia: Secondary | ICD-10-CM | POA: Diagnosis not present

## 2015-07-19 DIAGNOSIS — G629 Polyneuropathy, unspecified: Secondary | ICD-10-CM | POA: Diagnosis present

## 2015-07-19 LAB — CBC WITH DIFFERENTIAL/PLATELET
Basophils Absolute: 0.1 10*3/uL (ref 0.0–0.1)
Basophils Relative: 0 %
EOS PCT: 2 %
Eosinophils Absolute: 0.3 10*3/uL (ref 0.0–0.7)
HCT: 40.4 % (ref 36.0–46.0)
HEMOGLOBIN: 13 g/dL (ref 12.0–15.0)
LYMPHS ABS: 2.5 10*3/uL (ref 0.7–4.0)
LYMPHS PCT: 14 %
MCH: 29.5 pg (ref 26.0–34.0)
MCHC: 32.2 g/dL (ref 30.0–36.0)
MCV: 91.6 fL (ref 78.0–100.0)
MONOS PCT: 5 %
Monocytes Absolute: 0.9 10*3/uL (ref 0.1–1.0)
NEUTROS PCT: 79 %
Neutro Abs: 13.8 10*3/uL — ABNORMAL HIGH (ref 1.7–7.7)
Platelets: 294 10*3/uL (ref 150–400)
RBC: 4.41 MIL/uL (ref 3.87–5.11)
RDW: 13.5 % (ref 11.5–15.5)
WBC: 17.5 10*3/uL — AB (ref 4.0–10.5)

## 2015-07-19 LAB — BASIC METABOLIC PANEL
Anion gap: 14 (ref 5–15)
BUN: 28 mg/dL — AB (ref 6–20)
CHLORIDE: 108 mmol/L (ref 101–111)
CO2: 20 mmol/L — ABNORMAL LOW (ref 22–32)
CREATININE: 1.44 mg/dL — AB (ref 0.44–1.00)
Calcium: 9 mg/dL (ref 8.9–10.3)
GFR calc Af Amer: 35 mL/min — ABNORMAL LOW (ref >60)
GFR calc non Af Amer: 31 mL/min — ABNORMAL LOW (ref >60)
GLUCOSE: 131 mg/dL — AB (ref 65–99)
POTASSIUM: 5.2 mmol/L — AB (ref 3.5–5.1)
Sodium: 142 mmol/L (ref 135–145)

## 2015-07-19 MED ORDER — FENTANYL CITRATE (PF) 100 MCG/2ML IJ SOLN
25.0000 ug | Freq: Once | INTRAMUSCULAR | Status: AC
  Administered 2015-07-19: 25 ug via INTRAVENOUS
  Filled 2015-07-19: qty 2

## 2015-07-19 NOTE — ED Provider Notes (Signed)
CSN: [id number]     Arrival date & time 07/19/15  2129 History   First MD Initiated Contact with Patient 07/19/15 2135     Chief Complaint  Patient presents with  . Fall     (Consider location/radiation/quality/duration/timing/severity/associated sxs/prior Treatment) HPI  82 y f w pmh hypothyroid, who presents after fall.  Patient was getting up to let the cat out when she fell.  She has a h/o dementia, poor history provider.  She is having severe L hip pain.  Past Medical History  Diagnosis Date  . Hypothyroidism   . Shortness of breath   . Schatzki's ring   . Hypertension   . Vertebral compression fracture (HCC)   . Arthritis     right knee  . Peripheral neuropathy (HCC)   . Hiatal hernia    Past Surgical History  Procedure Laterality Date  . Esophagogastroduodenoscopy  10/24/08    normal, status post [facility] dilator, small hiatal hernia deformity of the antrum/pylorus  . Colonoscopy  10/24/08    anal papilla otherwise normal, pan colonic diverticulum and colonic mucosa  . Appendectomy  1941  . Abdominal hysterectomy  1977  . Cholecystectomy  1967  . Spine surgery  1986  . Knee surgery  09/2008  . Hemorrhoid surgery  1999  . Colonoscopy  remote    Dr. Jerolyn Shin Smith-->polpys per patient  . Ercp  1995    Dr. Rourk--> for dilated biliary tree. no stone or neoplasm or stricture found  . Esophagogastroduodenoscopy  1995    Dr. Rourk--> empiric dilation of esophagus with resolution of dysphagia  . Esophagogastroduodenoscopy   09/21/2011    RMR: Noncritical Schatzki's ring s/p dilated/Abnormal esophageal mucosa/Small  hiatal hermia  . Shoulder surgery    . Cystoscopy with stent placement Bilateral 05/20/2014    Procedure: CYSTOSCOPY WITH BILATERAL DOUBLE J STENT PLACEMENT;  Surgeon: Chelsea Aus, MD;  Location: [facility];  Service: Urology;  Laterality: Bilateral;  . Holmium laser application Bilateral 06/26/2014    Procedure: HOLMIUM LASER APPLICATION;  Surgeon: Chelsea Aus, MD;  Location: [facility];  Service: Urology;  Laterality: Bilateral;  . Hip arthroplasty Right 07/04/2014    Procedure: RIGHT PARTIAL HIP REPLACEMENT;  Surgeon: Vickki Hearing, MD;  Location: [facility];  Service: Orthopedics;  Laterality: Right;   Family History  Problem Relation Age of Onset  . Heart attack Father 61    deceased   Social History  Substance Use Topics  . Smoking status: Never Smoker   . Smokeless tobacco: None  . Alcohol Use: No   OB History    No data available     Review of Systems  Constitutional: Negative for fever and chills.  HENT: Negative for nosebleeds.   Eyes: Negative for visual disturbance.  Respiratory: Negative for cough and shortness of breath.   Cardiovascular: Negative for chest pain.  Gastrointestinal: Negative for nausea, vomiting, abdominal pain, diarrhea and constipation.  Genitourinary: Negative for dysuria.  Skin: Negative for rash.  Neurological: Negative for weakness.  All other systems reviewed and are negative.     Allergies  Lactulose; Penicillins; and Prilosec  Home Medications   Prior to Admission medications   Medication Sig Start Date End Date Taking? Authorizing Provider  acetaminophen (PAIN RELIEVER) 325 MG tablet Take 650 mg by mouth every 6 (six) hours as needed for mild pain or moderate pain.   Yes Historical Provider, MD  gabapentin (NEURONTIN) 300 MG capsule Take 1 capsule by mouth 3 (three) times  daily. 07/14/15  Yes Historical Provider, MD  levothyroxine (SYNTHROID, LEVOTHROID) 100 MCG tablet Take 100 mcg by mouth daily before breakfast.   Yes Historical Provider, MD  traMADol (ULTRAM) 50 MG tablet Take 1 tablet (50 mg total) by mouth every 6 (six) hours as needed for moderate pain. 07/07/14  Yes Angus McInnis, MD  ipratropium-albuterol (DUONEB) 0.5-2.5 (3) MG/3ML SOLN Take 3 mLs by nebulization every 6 (six) hours as needed (Shortness of breath). Reported on 07/19/2015    Historical Provider, MD   BP 102/65  mmHg  Pulse 52  Temp(Src) 97.6 F (36.4 C) (Oral)  Resp 17  SpO2 100% Physical Exam  Constitutional: No distress.  HENT:  Head: Normocephalic and atraumatic.  Eyes: EOM are normal. Pupils are equal, round, and reactive to light.  Neck: Normal range of motion. Neck supple.  Cardiovascular: Normal rate and intact distal pulses.   Pulmonary/Chest: No respiratory distress.  Abdominal: Soft. There is no tenderness.  Musculoskeletal: Normal range of motion.  Pain with rom of the L hip. There is good bilateral dp pulse.  Intact motor function in both feet  Neurological: She is alert.  Skin: No rash noted. She is not diaphoretic.  Psychiatric: She has a normal mood and affect.    ED Course  Procedures (including critical care time) Labs Review Labs Reviewed  CBC WITH DIFFERENTIAL/PLATELET - Abnormal; Notable for the following:    WBC 17.5 (*)    Neutro Abs 13.8 (*)    All other components within normal limits  BASIC METABOLIC PANEL - Abnormal; Notable for the following:    Potassium 5.2 (*)    CO2 20 (*)    Glucose, Bld 131 (*)    BUN 28 (*)    Creatinine, Ser 1.44 (*)    GFR calc non Af Amer 31 (*)    GFR calc Af Amer 35 (*)    All other components within normal limits  URINE CULTURE  URINALYSIS, ROUTINE W REFLEX MICROSCOPIC (NOT AT [facility])    Imaging Review Dg Chest 1 View  07/19/2015  CLINICAL DATA:  82 year old female with fall and left hip pain. EXAM: CHEST 1 VIEW COMPARISON:  Chest radiograph dated 07/03/2014 FINDINGS: The patient is rotated. The lungs are clear. No pleural effusion or pneumothorax. Stable cardiomegaly. The bones are osteopenic. No acute fracture. IMPRESSION: No acute cardiopulmonary process. Electronically Signed   By: Elgie Collard M.D.   On: 07/19/2015 23:20   Dg Knee 2 Views Left  07/19/2015  CLINICAL DATA:  Status post fall, with left hip pain. Initial encounter. EXAM: LEFT KNEE - 1-2 VIEW COMPARISON:  Left lower extremity CT performed 07/05/2003  FINDINGS: There is no evidence of fracture or dislocation. The joint spaces are preserved. Mild marginal osteophyte formation is noted at the medial and lateral compartments. The patellofemoral compartment is grossly unremarkable. No significant joint effusion is seen. The visualized soft tissues are normal in appearance. IMPRESSION: No evidence of fracture or dislocation. Electronically Signed   By: Roanna Raider M.D.   On: 07/19/2015 23:20   Dg Knee 2 Views Right  07/19/2015  CLINICAL DATA:  Status post fall, with concern for right knee injury. Initial encounter. EXAM: RIGHT KNEE - 1-2 VIEW COMPARISON:  Right knee radiographs performed earlier today at 1:36 a.m. FINDINGS: There is no evidence of fracture or dislocation. There is narrowing of the medial compartment, with marginal osteophytes seen arising at all 3 compartments. A small knee joint effusion is suggested. The visualized soft tissues are  normal in appearance. IMPRESSION: 1. No evidence of fracture or dislocation. 2. Mild tricompartmental osteoarthritis, with narrowing of the medial compartment. 3. Small knee joint effusion suggested. Electronically Signed   By: Roanna Raider M.D.   On: 07/19/2015 23:22   Ct Head Wo Contrast  07/19/2015  CLINICAL DATA:  Found in hall, lying on right side. Concern for head or cervical spine injury. Initial encounter. EXAM: CT HEAD WITHOUT CONTRAST CT CERVICAL SPINE WITHOUT CONTRAST TECHNIQUE: Multidetector CT imaging of the head and cervical spine was performed following the standard protocol without intravenous contrast. Multiplanar CT image reconstructions of the cervical spine were also generated. COMPARISON:  CT of the head and cervical spine performed 07/03/2014 FINDINGS: CT HEAD FINDINGS There is no evidence of acute infarction, mass lesion, or intra- or extra-axial hemorrhage on CT. Prominence of the ventricles and sulci reflects moderate cortical volume loss. Cerebellar atrophy is noted. Scattered  periventricular and subcortical white matter change likely reflects small vessel ischemic microangiopathy. Chronic ischemic change is noted at the basal ganglia bilaterally. The brainstem and fourth ventricle are within normal limits. The cerebral hemispheres demonstrate grossly normal gray-white differentiation. No mass effect or midline shift is seen. There is no evidence of fracture; visualized osseous structures are unremarkable in appearance. The visualized portions of the orbits are within normal limits. Mucosal thickening is noted at the right ethmoid air cells. The remaining paranasal sinuses and mastoid air cells are well-aerated. Mild soft tissue swelling is noted overlying the left frontal calvarium. CT CERVICAL SPINE FINDINGS There is no evidence of fracture or subluxation. Vertebral bodies demonstrate normal height and alignment. Mild multilevel disc space narrowing is suggested along the cervical spine, with underlying facet disease noted. Prevertebral soft tissues are within normal limits. The thyroid gland is unremarkable in appearance. The visualized lung apices are clear. Mild calcification is noted at the carotid bifurcations bilaterally. IMPRESSION: 1. No evidence of traumatic intracranial injury or fracture. 2. No evidence of fracture or subluxation along the cervical spine. 3. Mild soft tissue swelling overlying the left frontal calvarium. 4. Moderate cortical volume loss and scattered small vessel ischemic microangiopathy. 5. Chronic ischemic change at the basal ganglia bilaterally. 6. Mild degenerative change along the cervical spine. 7. Mild calcification at the carotid bifurcations bilaterally. Carotid ultrasound would be helpful for further evaluation, when and as deemed clinically appropriate. Electronically Signed   By: Roanna Raider M.D.   On: 07/19/2015 22:46   Ct Cervical Spine Wo Contrast  07/19/2015  CLINICAL DATA:  Found in hall, lying on right side. Concern for head or cervical  spine injury. Initial encounter. EXAM: CT HEAD WITHOUT CONTRAST CT CERVICAL SPINE WITHOUT CONTRAST TECHNIQUE: Multidetector CT imaging of the head and cervical spine was performed following the standard protocol without intravenous contrast. Multiplanar CT image reconstructions of the cervical spine were also generated. COMPARISON:  CT of the head and cervical spine performed 07/03/2014 FINDINGS: CT HEAD FINDINGS There is no evidence of acute infarction, mass lesion, or intra- or extra-axial hemorrhage on CT. Prominence of the ventricles and sulci reflects moderate cortical volume loss. Cerebellar atrophy is noted. Scattered periventricular and subcortical white matter change likely reflects small vessel ischemic microangiopathy. Chronic ischemic change is noted at the basal ganglia bilaterally. The brainstem and fourth ventricle are within normal limits. The cerebral hemispheres demonstrate grossly normal gray-white differentiation. No mass effect or midline shift is seen. There is no evidence of fracture; visualized osseous structures are unremarkable in appearance. The visualized portions of the orbits  are within normal limits. Mucosal thickening is noted at the right ethmoid air cells. The remaining paranasal sinuses and mastoid air cells are well-aerated. Mild soft tissue swelling is noted overlying the left frontal calvarium. CT CERVICAL SPINE FINDINGS There is no evidence of fracture or subluxation. Vertebral bodies demonstrate normal height and alignment. Mild multilevel disc space narrowing is suggested along the cervical spine, with underlying facet disease noted. Prevertebral soft tissues are within normal limits. The thyroid gland is unremarkable in appearance. The visualized lung apices are clear. Mild calcification is noted at the carotid bifurcations bilaterally. IMPRESSION: 1. No evidence of traumatic intracranial injury or fracture. 2. No evidence of fracture or subluxation along the cervical spine.  3. Mild soft tissue swelling overlying the left frontal calvarium. 4. Moderate cortical volume loss and scattered small vessel ischemic microangiopathy. 5. Chronic ischemic change at the basal ganglia bilaterally. 6. Mild degenerative change along the cervical spine. 7. Mild calcification at the carotid bifurcations bilaterally. Carotid ultrasound would be helpful for further evaluation, when and as deemed clinically appropriate. Electronically Signed   By: Roanna Raider M.D.   On: 07/19/2015 22:46   Dg Hip Unilat With Pelvis 1v Left  07/19/2015  CLINICAL DATA:  Status post fall, with left hip pain. Initial encounter. EXAM: DG HIP (WITH OR WITHOUT PELVIS) 1V*L* COMPARISON:  None. FINDINGS: There is a mildly displaced intertrochanteric fracture of the proximal left femur. The left femoral head remains seated at the acetabulum. The patient's right femoral prosthesis is grossly unremarkable in appearance. Degenerative change is noted at the lower lumbar spine. The visualized bowel gas pattern is grossly unremarkable. IMPRESSION: Mildly displaced intertrochanteric fracture of the proximal left femur. Electronically Signed   By: Roanna Raider M.D.   On: 07/19/2015 23:19   Dg Femur Min 2 Views Left  07/19/2015  CLINICAL DATA:  Status post fall, with left hip pain. Initial encounter. EXAM: LEFT FEMUR 2 VIEWS COMPARISON:  None. FINDINGS: There is a displaced left femoral intertrochanteric fracture. The left femoral head remains seated at the acetabulum. No additional fractures are seen. The left sacroiliac joint is unremarkable in appearance. No definite soft tissue abnormalities are characterized on radiograph. IMPRESSION: Displaced left femoral intertrochanteric fracture. Electronically Signed   By: Roanna Raider M.D.   On: 07/19/2015 23:23   I have personally reviewed and evaluated these images and lab results as part of my medical decision-making.   EKG Interpretation None      MDM   Final diagnoses:   Closed nondisplaced intertrochanteric fracture of left femur, initial encounter (HCC)    51 y f w pmh hypothyroid, who presents after fall.    Exam as above Concern for hip frx.  Given age, will also obtain ct head/ct c spine.  Will obtain cxr. Will obtain pelvis film Cbc/bmp  Will give fentanyl for pain Films show L intertrochanteric hip frx.  Ortho consulted  Will admit to medicine for further treatment   Silas Flood, MD 07/19/15 2349  Blane Ohara, MD 07/20/15 [id number]

## 2015-07-19 NOTE — ED Notes (Signed)
Pt arrives via EMS from home, was found in the hall laying on her right side. Pt reporting pain in left hip, hx of fracture to R hip. Pt screaming with movement. No IV access.

## 2015-07-19 NOTE — Progress Notes (Signed)
Patient ID: [name], female   DOB: [birth year], 82 y.o.   MRN: [id number] I have reviewed [name]'s hip xrays. She has a left hip intertrochanteric fracture.  This will need surgery with a rod/hip screw construct.  Will plan for surgery late tomorrow afternoon pending medical clearance.

## 2015-07-20 ENCOUNTER — Encounter (HOSPITAL_COMMUNITY): Payer: Self-pay | Admitting: Family Medicine

## 2015-07-20 ENCOUNTER — Encounter (HOSPITAL_COMMUNITY)
Admission: EM | Disposition: A | Discharge status: Skilled Nursing Facility | Payer: Self-pay | Source: Home / Self Care | Attending: Internal Medicine

## 2015-07-20 ENCOUNTER — Inpatient Hospital Stay (HOSPITAL_COMMUNITY): Payer: Medicare Other | Admitting: Certified Registered Nurse Anesthetist

## 2015-07-20 ENCOUNTER — Inpatient Hospital Stay (HOSPITAL_COMMUNITY): Payer: Medicare Other

## 2015-07-20 DIAGNOSIS — E039 Hypothyroidism, unspecified: Secondary | ICD-10-CM | POA: Diagnosis present

## 2015-07-20 DIAGNOSIS — I10 Essential (primary) hypertension: Secondary | ICD-10-CM

## 2015-07-20 DIAGNOSIS — S72002A Fracture of unspecified part of neck of left femur, initial encounter for closed fracture: Secondary | ICD-10-CM | POA: Diagnosis present

## 2015-07-20 DIAGNOSIS — S72009A Fracture of unspecified part of neck of unspecified femur, initial encounter for closed fracture: Secondary | ICD-10-CM | POA: Diagnosis present

## 2015-07-20 DIAGNOSIS — R338 Other retention of urine: Secondary | ICD-10-CM | POA: Diagnosis not present

## 2015-07-20 DIAGNOSIS — W1830XA Fall on same level, unspecified, initial encounter: Secondary | ICD-10-CM | POA: Diagnosis present

## 2015-07-20 DIAGNOSIS — Z66 Do not resuscitate: Secondary | ICD-10-CM | POA: Diagnosis present

## 2015-07-20 DIAGNOSIS — N183 Chronic kidney disease, stage 3 unspecified: Secondary | ICD-10-CM | POA: Diagnosis present

## 2015-07-20 DIAGNOSIS — D62 Acute posthemorrhagic anemia: Secondary | ICD-10-CM | POA: Diagnosis not present

## 2015-07-20 DIAGNOSIS — I129 Hypertensive chronic kidney disease with stage 1 through stage 4 chronic kidney disease, or unspecified chronic kidney disease: Secondary | ICD-10-CM | POA: Diagnosis present

## 2015-07-20 DIAGNOSIS — D72829 Elevated white blood cell count, unspecified: Secondary | ICD-10-CM | POA: Diagnosis not present

## 2015-07-20 DIAGNOSIS — S72142A Displaced intertrochanteric fracture of left femur, initial encounter for closed fracture: Secondary | ICD-10-CM | POA: Diagnosis present

## 2015-07-20 DIAGNOSIS — F05 Delirium due to known physiological condition: Secondary | ICD-10-CM | POA: Diagnosis not present

## 2015-07-20 DIAGNOSIS — G629 Polyneuropathy, unspecified: Secondary | ICD-10-CM | POA: Diagnosis present

## 2015-07-20 DIAGNOSIS — E875 Hyperkalemia: Secondary | ICD-10-CM | POA: Diagnosis present

## 2015-07-20 DIAGNOSIS — F0390 Unspecified dementia without behavioral disturbance: Secondary | ICD-10-CM | POA: Diagnosis present

## 2015-07-20 DIAGNOSIS — S72145A Nondisplaced intertrochanteric fracture of left femur, initial encounter for closed fracture: Secondary | ICD-10-CM | POA: Insufficient documentation

## 2015-07-20 DIAGNOSIS — I959 Hypotension, unspecified: Secondary | ICD-10-CM | POA: Diagnosis not present

## 2015-07-20 HISTORY — PX: INTRAMEDULLARY (IM) NAIL INTERTROCHANTERIC: SHX5875

## 2015-07-20 LAB — CBC
HCT: 37.5 % (ref 36.0–46.0)
HEMOGLOBIN: 12.2 g/dL (ref 12.0–15.0)
MCH: 29.6 pg (ref 26.0–34.0)
MCHC: 32.5 g/dL (ref 30.0–36.0)
MCV: 91 fL (ref 78.0–100.0)
Platelets: 277 10*3/uL (ref 150–400)
RBC: 4.12 MIL/uL (ref 3.87–5.11)
RDW: 13.4 % (ref 11.5–15.5)
WBC: 14.1 10*3/uL — AB (ref 4.0–10.5)

## 2015-07-20 LAB — TYPE AND SCREEN
ABO/RH(D): O NEG
Antibody Screen: NEGATIVE

## 2015-07-20 LAB — BASIC METABOLIC PANEL
ANION GAP: 11 (ref 5–15)
BUN: 27 mg/dL — AB (ref 6–20)
CHLORIDE: 108 mmol/L (ref 101–111)
CO2: 22 mmol/L (ref 22–32)
Calcium: 9 mg/dL (ref 8.9–10.3)
Creatinine, Ser: 1.46 mg/dL — ABNORMAL HIGH (ref 0.44–1.00)
GFR calc Af Amer: 35 mL/min — ABNORMAL LOW (ref >60)
GFR, EST NON AFRICAN AMERICAN: 30 mL/min — AB (ref >60)
Glucose, Bld: 157 mg/dL — ABNORMAL HIGH (ref 65–99)
POTASSIUM: 5 mmol/L (ref 3.5–5.1)
SODIUM: 141 mmol/L (ref 135–145)

## 2015-07-20 LAB — GLUCOSE, CAPILLARY
GLUCOSE-CAPILLARY: 139 mg/dL — AB (ref 65–99)
GLUCOSE-CAPILLARY: 95 mg/dL (ref 65–99)
Glucose-Capillary: 61 mg/dL — ABNORMAL LOW (ref 65–99)
Glucose-Capillary: 96 mg/dL (ref 65–99)
Glucose-Capillary: 108 mg/dL — ABNORMAL HIGH (ref 65–99)

## 2015-07-20 LAB — PROTIME-INR
INR: 1.1 (ref 0.00–1.49)
Prothrombin Time: 14.4 seconds (ref 11.6–15.2)

## 2015-07-20 LAB — ABO/RH: ABO/RH(D): O NEG

## 2015-07-20 LAB — MRSA PCR SCREENING: MRSA BY PCR: NEGATIVE

## 2015-07-20 LAB — APTT: aPTT: 29 seconds (ref 24–37)

## 2015-07-20 SURGERY — FIXATION, FRACTURE, INTERTROCHANTERIC, WITH INTRAMEDULLARY ROD
Anesthesia: Spinal | Site: Hip | Laterality: Left

## 2015-07-20 MED ORDER — 0.9 % SODIUM CHLORIDE (POUR BTL) OPTIME
TOPICAL | Status: DC | PRN
  Administered 2015-07-20: 1000 mL

## 2015-07-20 MED ORDER — ONDANSETRON HCL 4 MG PO TABS: 4.0000 mg | ORAL_TABLET | Freq: Four times a day (QID) | ORAL | Status: DC | PRN

## 2015-07-20 MED ORDER — TRAMADOL HCL 50 MG PO TABS: 50.0000 mg | ORAL_TABLET | Freq: Four times a day (QID) | ORAL | Status: DC | PRN

## 2015-07-20 MED ORDER — SODIUM CHLORIDE 0.9 % IV SOLN
1.0000 g | Freq: Once | INTRAVENOUS | Status: AC
  Administered 2015-07-20: 1 g via INTRAVENOUS
  Filled 2015-07-20: qty 10

## 2015-07-20 MED ORDER — METOCLOPRAMIDE HCL 5 MG/ML IJ SOLN: 5.0000 mg | Freq: Three times a day (TID) | INTRAMUSCULAR | Status: DC | PRN

## 2015-07-20 MED ORDER — ENSURE ENLIVE PO LIQD
237.0000 mL | Freq: Every day | ORAL | Status: DC
  Administered 2015-07-21 – 2015-07-23 (×3): 237 mL via ORAL

## 2015-07-20 MED ORDER — DEXTROSE 50 % IV SOLN
1.0000 | Freq: Once | INTRAVENOUS | Status: AC
  Administered 2015-07-20: 50 mL via INTRAVENOUS
  Filled 2015-07-20: qty 50

## 2015-07-20 MED ORDER — PHENYLEPHRINE 40 MCG/ML (10ML) SYRINGE FOR IV PUSH (FOR BLOOD PRESSURE SUPPORT)
PREFILLED_SYRINGE | INTRAVENOUS | Status: AC
  Filled 2015-07-20: qty 40

## 2015-07-20 MED ORDER — DEXTROSE-NACL 5-0.45 % IV SOLN
INTRAVENOUS | Status: DC
  Administered 2015-07-20: 75 mL/h via INTRAVENOUS

## 2015-07-20 MED ORDER — ACETAMINOPHEN 325 MG PO TABS: 650.0000 mg | ORAL_TABLET | Freq: Four times a day (QID) | ORAL | Status: DC | PRN

## 2015-07-20 MED ORDER — MORPHINE SULFATE (PF) 2 MG/ML IV SOLN: 2.0000 mg | INTRAVENOUS | Status: DC | PRN

## 2015-07-20 MED ORDER — HYDROCODONE-ACETAMINOPHEN 5-325 MG PO TABS
1.0000 | ORAL_TABLET | Freq: Four times a day (QID) | ORAL | Status: DC | PRN
  Administered 2015-07-20: 1 via ORAL
  Filled 2015-07-20: qty 1

## 2015-07-20 MED ORDER — BISACODYL 5 MG PO TBEC
5.0000 mg | DELAYED_RELEASE_TABLET | Freq: Every day | ORAL | Status: DC | PRN
  Administered 2015-07-21 – 2015-07-22 (×2): 5 mg via ORAL
  Filled 2015-07-20 (×2): qty 1

## 2015-07-20 MED ORDER — METHOCARBAMOL 500 MG PO TABS: 500.0000 mg | ORAL_TABLET | Freq: Four times a day (QID) | ORAL | Status: DC | PRN

## 2015-07-20 MED ORDER — HYDROCODONE-ACETAMINOPHEN 5-325 MG PO TABS: 1.0000 | ORAL_TABLET | Freq: Four times a day (QID) | ORAL | Status: DC | PRN

## 2015-07-20 MED ORDER — FENTANYL CITRATE (PF) 250 MCG/5ML IJ SOLN
INTRAMUSCULAR | Status: AC
  Filled 2015-07-20: qty 5

## 2015-07-20 MED ORDER — PROPOFOL 10 MG/ML IV BOLUS
INTRAVENOUS | Status: DC | PRN
  Administered 2015-07-20 (×3): 30 mg via INTRAVENOUS
  Administered 2015-07-20: 40 mg via INTRAVENOUS

## 2015-07-20 MED ORDER — PHENYLEPHRINE HCL 10 MG/ML IJ SOLN
INTRAMUSCULAR | Status: DC | PRN
  Administered 2015-07-20: 120 ug via INTRAVENOUS
  Administered 2015-07-20: 80 ug via INTRAVENOUS

## 2015-07-20 MED ORDER — MORPHINE SULFATE (PF) 2 MG/ML IV SOLN
0.5000 mg | INTRAVENOUS | Status: DC | PRN
  Administered 2015-07-20: 0.5 mg via INTRAVENOUS
  Filled 2015-07-20: qty 1

## 2015-07-20 MED ORDER — GABAPENTIN 300 MG PO CAPS
300.0000 mg | ORAL_CAPSULE | Freq: Three times a day (TID) | ORAL | Status: DC
  Administered 2015-07-20 – 2015-07-23 (×9): 300 mg via ORAL
  Filled 2015-07-20 (×9): qty 1

## 2015-07-20 MED ORDER — MENTHOL 3 MG MT LOZG: 1.0000 | LOZENGE | OROMUCOSAL | Status: DC | PRN

## 2015-07-20 MED ORDER — SODIUM CHLORIDE 0.9 % IV SOLN
INTRAVENOUS | Status: DC
  Administered 2015-07-20: 22:00:00 via INTRAVENOUS

## 2015-07-20 MED ORDER — PHENOL 1.4 % MT LIQD: 1.0000 | OROMUCOSAL | Status: DC | PRN

## 2015-07-20 MED ORDER — LEVOTHYROXINE SODIUM 100 MCG PO TABS
100.0000 ug | ORAL_TABLET | Freq: Every day | ORAL | Status: DC
  Administered 2015-07-21 – 2015-07-23 (×3): 100 ug via ORAL
  Filled 2015-07-20 (×4): qty 1

## 2015-07-20 MED ORDER — PROPOFOL 500 MG/50ML IV EMUL
INTRAVENOUS | Status: DC | PRN
  Administered 2015-07-20: 10 ug/kg/min via INTRAVENOUS

## 2015-07-20 MED ORDER — DEXTROSE-NACL 5-0.45 % IV SOLN
INTRAVENOUS | Status: DC
  Administered 2015-07-20: 12:00:00 via INTRAVENOUS

## 2015-07-20 MED ORDER — OXYCODONE HCL 5 MG PO TABS: 5.0000 mg | ORAL_TABLET | ORAL | Status: DC | PRN

## 2015-07-20 MED ORDER — CEFTRIAXONE SODIUM 1 G IJ SOLR: 1.0000 g | Freq: Once | INTRAMUSCULAR | Status: DC

## 2015-07-20 MED ORDER — INSULIN ASPART 100 UNIT/ML IV SOLN
10.0000 [IU] | Freq: Once | INTRAVENOUS | Status: DC
  Filled 2015-07-20: qty 0.1

## 2015-07-20 MED ORDER — ONDANSETRON HCL 4 MG/2ML IJ SOLN: 4.0000 mg | Freq: Once | INTRAMUSCULAR | Status: DC | PRN

## 2015-07-20 MED ORDER — HYDRALAZINE HCL 20 MG/ML IJ SOLN: 10.0000 mg | Freq: Four times a day (QID) | INTRAMUSCULAR | Status: DC | PRN

## 2015-07-20 MED ORDER — ROCURONIUM BROMIDE 50 MG/5ML IV SOLN
INTRAVENOUS | Status: AC
  Filled 2015-07-20: qty 2

## 2015-07-20 MED ORDER — PHENYLEPHRINE HCL 10 MG/ML IJ SOLN
10.0000 mg | INTRAVENOUS | Status: DC | PRN
  Administered 2015-07-20: 50 ug/min via INTRAVENOUS

## 2015-07-20 MED ORDER — ONDANSETRON HCL 4 MG/2ML IJ SOLN
INTRAMUSCULAR | Status: AC
Start: 2015-07-20 — End: 2015-07-20
  Filled 2015-07-20: qty 4

## 2015-07-20 MED ORDER — MORPHINE SULFATE (PF) 2 MG/ML IV SOLN: 0.5000 mg | INTRAVENOUS | Status: DC | PRN

## 2015-07-20 MED ORDER — ACETAMINOPHEN 650 MG RE SUPP: 650.0000 mg | Freq: Four times a day (QID) | RECTAL | Status: DC | PRN

## 2015-07-20 MED ORDER — HYDROMORPHONE HCL 1 MG/ML IJ SOLN: 0.5000 mg | INTRAMUSCULAR | Status: DC | PRN

## 2015-07-20 MED ORDER — IPRATROPIUM-ALBUTEROL 0.5-2.5 (3) MG/3ML IN SOLN: 3.0000 mL | Freq: Four times a day (QID) | RESPIRATORY_TRACT | Status: DC | PRN

## 2015-07-20 MED ORDER — HEPARIN SODIUM (PORCINE) 5000 UNIT/ML IJ SOLN
5000.0000 [IU] | Freq: Three times a day (TID) | INTRAMUSCULAR | Status: DC
  Administered 2015-07-21 – 2015-07-23 (×7): 5000 [IU] via SUBCUTANEOUS
  Filled 2015-07-20 (×7): qty 1

## 2015-07-20 MED ORDER — LACTATED RINGERS IV SOLN
INTRAVENOUS | Status: DC
  Administered 2015-07-20 (×2): via INTRAVENOUS

## 2015-07-20 MED ORDER — DEXTROSE-NACL 5-0.45 % IV SOLN: INTRAVENOUS | Status: DC

## 2015-07-20 MED ORDER — ASPIRIN EC 325 MG PO TBEC
325.0000 mg | DELAYED_RELEASE_TABLET | Freq: Every day | ORAL | Status: DC
  Administered 2015-07-21 – 2015-07-23 (×3): 325 mg via ORAL
  Filled 2015-07-20 (×3): qty 1

## 2015-07-20 MED ORDER — CEFAZOLIN SODIUM-DEXTROSE 2-3 GM-% IV SOLR
INTRAVENOUS | Status: DC | PRN
  Administered 2015-07-20: 2 g via INTRAVENOUS

## 2015-07-20 MED ORDER — INSULIN ASPART 100 UNIT/ML ~~LOC~~ SOLN
10.0000 [IU] | Freq: Once | SUBCUTANEOUS | Status: AC
  Administered 2015-07-20: 10 [IU] via INTRAVENOUS

## 2015-07-20 MED ORDER — GLYCOPYRROLATE 0.2 MG/ML IJ SOLN
INTRAMUSCULAR | Status: DC | PRN
  Administered 2015-07-20: 0.2 mg via INTRAVENOUS

## 2015-07-20 MED ORDER — ONDANSETRON HCL 4 MG/2ML IJ SOLN: 4.0000 mg | Freq: Four times a day (QID) | INTRAMUSCULAR | Status: DC | PRN

## 2015-07-20 MED ORDER — CLINDAMYCIN PHOSPHATE 600 MG/50ML IV SOLN
600.0000 mg | Freq: Four times a day (QID) | INTRAVENOUS | Status: AC
  Administered 2015-07-21 (×2): 600 mg via INTRAVENOUS
  Filled 2015-07-20 (×2): qty 50

## 2015-07-20 MED ORDER — SENNOSIDES-DOCUSATE SODIUM 8.6-50 MG PO TABS: 1.0000 | ORAL_TABLET | Freq: Every evening | ORAL | Status: DC | PRN

## 2015-07-20 MED ORDER — METOCLOPRAMIDE HCL 5 MG PO TABS: 5.0000 mg | ORAL_TABLET | Freq: Three times a day (TID) | ORAL | Status: DC | PRN

## 2015-07-20 MED ORDER — METHOCARBAMOL 1000 MG/10ML IJ SOLN: 500.0000 mg | Freq: Four times a day (QID) | INTRAVENOUS | Status: DC | PRN

## 2015-07-20 SURGICAL SUPPLY — 48 items
BLADE SURG 15 STRL LF DISP TIS (BLADE) ×1 IMPLANT
BLADE SURG 15 STRL SS (BLADE)
BNDG COHESIVE 4X5 TAN NS LF (GAUZE/BANDAGES/DRESSINGS) ×3 IMPLANT
BNDG GAUZE ELAST 4 BULKY (GAUZE/BANDAGES/DRESSINGS) ×3 IMPLANT
COVER PERINEAL POST (MISCELLANEOUS) ×3 IMPLANT
COVER SURGICAL LIGHT HANDLE (MISCELLANEOUS) ×5 IMPLANT
DRAPE PROXIMA HALF (DRAPES) IMPLANT
DRAPE STERI IOBAN 125X83 (DRAPES) ×1 IMPLANT
DRSG MEPILEX BORDER 4X4 (GAUZE/BANDAGES/DRESSINGS) ×4 IMPLANT
DRSG MEPILEX BORDER 4X8 (GAUZE/BANDAGES/DRESSINGS) ×1 IMPLANT
DRSG PAD ABDOMINAL 8X10 ST (GAUZE/BANDAGES/DRESSINGS) ×2 IMPLANT
DURAPREP 26ML APPLICATOR (WOUND CARE) ×5 IMPLANT
ELECT REM PT RETURN 9FT ADLT (ELECTROSURGICAL) ×3
ELECTRODE REM PT RTRN 9FT ADLT (ELECTROSURGICAL) ×1 IMPLANT
FACESHIELD WRAPAROUND (MASK) IMPLANT
FACESHIELD WRAPAROUND OR TEAM (MASK) ×1 IMPLANT
GAUZE XEROFORM 5X9 LF (GAUZE/BANDAGES/DRESSINGS) ×3 IMPLANT
GLOVE BIO SURGEON STRL SZ8 (GLOVE) ×3 IMPLANT
GLOVE BIOGEL PI IND STRL 8 (GLOVE) ×1 IMPLANT
GLOVE BIOGEL PI INDICATOR 8 (GLOVE) ×2
GLOVE ORTHO TXT STRL SZ7.5 (GLOVE) ×7 IMPLANT
GOWN STRL REUS W/ TWL LRG LVL3 (GOWN DISPOSABLE) ×1 IMPLANT
GOWN STRL REUS W/ TWL XL LVL3 (GOWN DISPOSABLE) ×2 IMPLANT
GOWN STRL REUS W/TWL LRG LVL3 (GOWN DISPOSABLE) ×3
GOWN STRL REUS W/TWL XL LVL3 (GOWN DISPOSABLE) ×6
GUIDE PIN 3.2MM (MISCELLANEOUS) ×3
GUIDE PIN ORTH 343X3.2XBRAD (MISCELLANEOUS) IMPLANT
KIT BASIN OR (CUSTOM PROCEDURE TRAY) ×3 IMPLANT
KIT ROOM TURNOVER OR (KITS) ×3 IMPLANT
LINER BOOT UNIVERSAL DISP (MISCELLANEOUS) ×1 IMPLANT
MANIFOLD NEPTUNE II (INSTRUMENTS) ×3 IMPLANT
NAIL TRIGEN INTERTAN 10S 340MM (Orthopedic Implant) ×2 IMPLANT
NS IRRIG 1000ML POUR BTL (IV SOLUTION) ×3 IMPLANT
PACK GENERAL/GYN (CUSTOM PROCEDURE TRAY) ×3 IMPLANT
PAD ARMBOARD 7.5X6 YLW CONV (MISCELLANEOUS) ×6 IMPLANT
PAD CAST 4YDX4 CTTN HI CHSV (CAST SUPPLIES) ×2 IMPLANT
PADDING CAST COTTON 4X4 STRL (CAST SUPPLIES)
SCREW LAG COMPR KIT 100/95 (Screw) ×2 IMPLANT
STAPLER VISISTAT 35W (STAPLE) ×3 IMPLANT
SUT VIC AB 0 CT1 27 (SUTURE) ×6
SUT VIC AB 0 CT1 27XBRD ANBCTR (SUTURE) ×2 IMPLANT
SUT VIC AB 1 CT1 27 (SUTURE) ×3
SUT VIC AB 1 CT1 27XBRD ANBCTR (SUTURE) IMPLANT
SUT VIC AB 2-0 CT1 27 (SUTURE) ×6
SUT VIC AB 2-0 CT1 TAPERPNT 27 (SUTURE) ×2 IMPLANT
TOWEL OR 17X24 6PK STRL BLUE (TOWEL DISPOSABLE) ×3 IMPLANT
TOWEL OR 17X26 10 PK STRL BLUE (TOWEL DISPOSABLE) ×3 IMPLANT
WATER STERILE IRR 1000ML POUR (IV SOLUTION) ×3 IMPLANT

## 2015-07-20 NOTE — Progress Notes (Signed)
Utilization review completed.  

## 2015-07-20 NOTE — Transfer of Care (Signed)
Immediate Anesthesia Transfer of Care Note  Patient: [name]  Procedure(s) Performed: Procedure(s): INTRAMEDULLARY (IM) NAIL INTERTROCHANTRIC LEFT HIP (Left)  Patient Location: PACU  Anesthesia Type:Spinal  Level of Consciousness: awake  Airway & Oxygen Therapy: Patient Spontanous Breathing and Patient connected to nasal cannula oxygen  Post-op Assessment: Report given to RN and Post -op Vital signs reviewed and stable  Post vital signs: Reviewed and stable  Last Vitals:  Filed Vitals:   07/20/15 0519 07/20/15 1220  BP: 117/89 122/62  Pulse: 75 76  Temp: 37.3 C 36.4 C  Resp: 18 18    Complications: No apparent anesthesia complications

## 2015-07-20 NOTE — Progress Notes (Signed)
Tried to insert foley unable times two .

## 2015-07-20 NOTE — Progress Notes (Signed)
Yellow colored ring with pink colored stone removed from ring finger placed in bag labeled and placed on front of chart.

## 2015-07-20 NOTE — Anesthesia Procedure Notes (Addendum)
Spinal Patient location during procedure: OR Start time: 07/20/2015 6:12 PM End time: 07/20/2015 6:15 PM Staffing Anesthesiologist: Laverle Hobby Performed by: anesthesiologist  Preanesthetic Checklist Completed: patient identified, site marked, surgical consent, pre-op evaluation, timeout performed, IV checked, risks and benefits discussed and monitors and equipment checked Spinal Block Patient position: left lateral decubitus Prep: ChloraPrep Patient monitoring: heart rate, cardiac monitor, continuous pulse ox and blood pressure Approach: left paramedian Location: L2-3 Injection technique: single-shot Needle Needle type: Pencil-Tip  Needle gauge: 22 G Needle length: 9 cm Needle insertion depth: 4 cm Assessment Sensory level: T10 Additional Notes Pt tolerated procedure w/ proprofol bolus. CSF clear free flow w/ blood. 10 mg Marcaine (0.75%) w/ epi w/o difficulty. Pt tolerated well. GES  Procedure Name: MAC Date/Time: 07/20/2015 6:06 PM Performed by: Faustino Congress Deejay Koppelman Pre-anesthesia Checklist: Patient identified, Emergency Drugs available, Suction available and Patient being monitored Oxygen Delivery Method: Nasal cannula

## 2015-07-20 NOTE — H&P (Signed)
[facility] History and Physical  [name] WUJ:[id number] DOB: 1920-08-14 DOA: 07/19/2015  Referring physician: ED physician PCP: [name], MD  Specialists: Dr. Romeo Apple (orthopedics)   Chief Complaint:  Fall, left hip pain  HPI: [name] is a 82 y.o. female with PMH of hypertension, hypothyroidism, and right hip fracture approximately one year ago who presents to the ED after being found on the floor by family complaining of left hip pain. Patient was reportedly in her usual state of health earlier in the day but was found on the floor by her son complaining of left hip pain. She screamed out in pain with attempts to help her up and so EMS was activated. There was no apparent head trauma and her family does not believe that it could've been more than one or 2 minutes that she was on the floor.  In ED, patient was found to be afebrile, saturating well on room air, with borderline bradycardia and stable blood pressures. Noncontrast CT of the head and cervical spine were obtained and negative for any acute abnormalities. Chest x-ray was clear and radiographs of the left pelvis and femur demonstrate a displaced intertrochanteric fracture. Basic blood work was sent and returns notable for a mild hyperkalemia, leukocytosis to 17,500, and serum creatinine of 1.44. Dr. Rayburn Ma of orthopedic surgery was consulted from the ED, reviewed the films, and has tentatively planned for surgical repair in the late afternoon of 07/20/2015. Patient will be admitted to medical-surgical unit for pain management and preoperative optimization.  Where does patient live?   At home    Can patient participate in ADLs?   Some   Review of Systems:   Unable to obtain ROS secondary to patient's clinical condition with dementia and resulting cognitive deficits.     Allergy:  Allergies  Allergen Reactions  . Lactulose     REACTION: unknown reaction  . Penicillins     REACTION: unknown reaction  .  Prilosec [Omeprazole] Itching and Rash    Past Medical History  Diagnosis Date  . Hypothyroidism   . Shortness of breath   . Schatzki's ring   . Hypertension   . Vertebral compression fracture (HCC)   . Arthritis     right knee  . Peripheral neuropathy (HCC)   . Hiatal hernia     Past Surgical History  Procedure Laterality Date  . Esophagogastroduodenoscopy  10/24/08    normal, status post [facility] dilator, small hiatal hernia deformity of the antrum/pylorus  . Colonoscopy  10/24/08    anal papilla otherwise normal, pan colonic diverticulum and colonic mucosa  . Appendectomy  1941  . Abdominal hysterectomy  1977  . Cholecystectomy  1967  . Spine surgery  1986  . Knee surgery  09/2008  . Hemorrhoid surgery  1999  . Colonoscopy  remote    Dr. Jerolyn Shin Smith-->polpys per patient  . Ercp  1995    Dr. Rourk--> for dilated biliary tree. no stone or neoplasm or stricture found  . Esophagogastroduodenoscopy  1995    Dr. Rourk--> empiric dilation of esophagus with resolution of dysphagia  . Esophagogastroduodenoscopy   09/21/2011    RMR: Noncritical Schatzki's ring s/p dilated/Abnormal esophageal mucosa/Small  hiatal hermia  . Shoulder surgery    . Cystoscopy with stent placement Bilateral 05/20/2014    Procedure: CYSTOSCOPY WITH BILATERAL DOUBLE J STENT PLACEMENT;  Surgeon: Chelsea Aus, MD;  Location: [facility];  Service: Urology;  Laterality: Bilateral;  . Holmium laser application Bilateral 06/26/2014  Procedure: HOLMIUM LASER APPLICATION;  Surgeon: Chelsea Aus, MD;  Location: [facility];  Service: Urology;  Laterality: Bilateral;  . Hip arthroplasty Right 07/04/2014    Procedure: RIGHT PARTIAL HIP REPLACEMENT;  Surgeon: Vickki Hearing, MD;  Location: [facility];  Service: Orthopedics;  Laterality: Right;    Social History:  reports that she has never smoked. She does not have any smokeless tobacco history on file. She reports that she does not drink alcohol or use illicit  drugs.  Family History:  Family History  Problem Relation Age of Onset  . Heart attack Father 61    deceased     Prior to Admission medications   Medication Sig Start Date End Date Taking? Authorizing Provider  acetaminophen (PAIN RELIEVER) 325 MG tablet Take 650 mg by mouth every 6 (six) hours as needed for mild pain or moderate pain.   Yes Historical Provider, MD  gabapentin (NEURONTIN) 300 MG capsule Take 1 capsule by mouth 3 (three) times daily. 07/14/15  Yes Historical Provider, MD  levothyroxine (SYNTHROID, LEVOTHROID) 100 MCG tablet Take 100 mcg by mouth daily before breakfast.   Yes Historical Provider, MD  traMADol (ULTRAM) 50 MG tablet Take 1 tablet (50 mg total) by mouth every 6 (six) hours as needed for moderate pain. 07/07/14  Yes Angus McInnis, MD  ipratropium-albuterol (DUONEB) 0.5-2.5 (3) MG/3ML SOLN Take 3 mLs by nebulization every 6 (six) hours as needed (Shortness of breath). Reported on 07/19/2015    Historical Provider, MD    Physical Exam: Filed Vitals:   07/19/15 2318 07/19/15 2330 07/20/15 0015 07/20/15 0209  BP: 117/100 102/65 144/64 109/81  Pulse: 56 52 61 61  Temp:    98.1 F (36.7 C)  TempSrc:    Oral  Resp:    18  SpO2: 97% 100% 93% 97%   General: Not in acute distress HEENT:       Eyes: PERRL, EOMI, no scleral icterus or conjunctival pallor.       ENT: No discharge from the ears or nose, no pharyngeal ulcers.        Neck: No JVD, no bruit, no appreciable mass Heme: No cervical adenopathy, no pallor Cardiac: Rate ~60 and regular with no appreciable murmur, No gallops or rubs. Pulm: Good air movement bilaterally. No rales, wheezing, rhonchi or rubs. Abd: Soft, nondistended, nontender, no rebound pain or gaurding, no mass or organomegaly, BS present. Ext: No LE edema bilaterally. 1+ DP/PT pulse bilaterally. Lt hip exquisitely tender, left leg externally rotated, no gross leg-length discrepancy.  Musculoskeletal: No gross deformity, no red, hot, swollen  joints   Skin: No rashes or wounds on exposed surfaces  Neuro: Alert, oriented to person only, cranial nerves II-XII grossly intact, sensation to light touch intact and symmetric in distal LEs b/l. No focal findings Psych: Patient is not overtly psychotic, though confused.  Labs on Admission:  Basic Metabolic Panel:  Recent Labs Lab 07/19/15 2150  NA 142  K 5.2*  CL 108  CO2 20*  GLUCOSE 131*  BUN 28*  CREATININE 1.44*  CALCIUM 9.0   Liver Function Tests: No results for input(s): AST, ALT, ALKPHOS, BILITOT, PROT, ALBUMIN in the last 168 hours. No results for input(s): LIPASE, AMYLASE in the last 168 hours. No results for input(s): AMMONIA in the last 168 hours. CBC:  Recent Labs Lab 07/19/15 2150  WBC 17.5*  NEUTROABS 13.8*  HGB 13.0  HCT 40.4  MCV 91.6  PLT 294   Cardiac Enzymes: No results for  input(s): CKTOTAL, CKMB, CKMBINDEX, TROPONINI in the last 168 hours.  BNP (last 3 results) No results for input(s): BNP in the last 8760 hours.  ProBNP (last 3 results) No results for input(s): PROBNP in the last 8760 hours.  CBG: No results for input(s): GLUCAP in the last 168 hours.  Radiological Exams on Admission: Dg Chest 1 View  07/19/2015  CLINICAL DATA:  82 year old female with fall and left hip pain. EXAM: CHEST 1 VIEW COMPARISON:  Chest radiograph dated 07/03/2014 FINDINGS: The patient is rotated. The lungs are clear. No pleural effusion or pneumothorax. Stable cardiomegaly. The bones are osteopenic. No acute fracture. IMPRESSION: No acute cardiopulmonary process. Electronically Signed   By: Elgie Collard M.D.   On: 07/19/2015 23:20   Dg Knee 2 Views Left  07/19/2015  CLINICAL DATA:  Status post fall, with left hip pain. Initial encounter. EXAM: LEFT KNEE - 1-2 VIEW COMPARISON:  Left lower extremity CT performed 07/05/2003 FINDINGS: There is no evidence of fracture or dislocation. The joint spaces are preserved. Mild marginal osteophyte formation is noted at the  medial and lateral compartments. The patellofemoral compartment is grossly unremarkable. No significant joint effusion is seen. The visualized soft tissues are normal in appearance. IMPRESSION: No evidence of fracture or dislocation. Electronically Signed   By: Roanna Raider M.D.   On: 07/19/2015 23:20   Dg Knee 2 Views Right  07/19/2015  CLINICAL DATA:  Status post fall, with concern for right knee injury. Initial encounter. EXAM: RIGHT KNEE - 1-2 VIEW COMPARISON:  Right knee radiographs performed earlier today at 1:36 a.m. FINDINGS: There is no evidence of fracture or dislocation. There is narrowing of the medial compartment, with marginal osteophytes seen arising at all 3 compartments. A small knee joint effusion is suggested. The visualized soft tissues are normal in appearance. IMPRESSION: 1. No evidence of fracture or dislocation. 2. Mild tricompartmental osteoarthritis, with narrowing of the medial compartment. 3. Small knee joint effusion suggested. Electronically Signed   By: Roanna Raider M.D.   On: 07/19/2015 23:22   Ct Head Wo Contrast  07/19/2015  CLINICAL DATA:  Found in hall, lying on right side. Concern for head or cervical spine injury. Initial encounter. EXAM: CT HEAD WITHOUT CONTRAST CT CERVICAL SPINE WITHOUT CONTRAST TECHNIQUE: Multidetector CT imaging of the head and cervical spine was performed following the standard protocol without intravenous contrast. Multiplanar CT image reconstructions of the cervical spine were also generated. COMPARISON:  CT of the head and cervical spine performed 07/03/2014 FINDINGS: CT HEAD FINDINGS There is no evidence of acute infarction, mass lesion, or intra- or extra-axial hemorrhage on CT. Prominence of the ventricles and sulci reflects moderate cortical volume loss. Cerebellar atrophy is noted. Scattered periventricular and subcortical white matter change likely reflects small vessel ischemic microangiopathy. Chronic ischemic change is noted at the basal  ganglia bilaterally. The brainstem and fourth ventricle are within normal limits. The cerebral hemispheres demonstrate grossly normal gray-white differentiation. No mass effect or midline shift is seen. There is no evidence of fracture; visualized osseous structures are unremarkable in appearance. The visualized portions of the orbits are within normal limits. Mucosal thickening is noted at the right ethmoid air cells. The remaining paranasal sinuses and mastoid air cells are well-aerated. Mild soft tissue swelling is noted overlying the left frontal calvarium. CT CERVICAL SPINE FINDINGS There is no evidence of fracture or subluxation. Vertebral bodies demonstrate normal height and alignment. Mild multilevel disc space narrowing is suggested along the cervical spine, with underlying facet  disease noted. Prevertebral soft tissues are within normal limits. The thyroid gland is unremarkable in appearance. The visualized lung apices are clear. Mild calcification is noted at the carotid bifurcations bilaterally. IMPRESSION: 1. No evidence of traumatic intracranial injury or fracture. 2. No evidence of fracture or subluxation along the cervical spine. 3. Mild soft tissue swelling overlying the left frontal calvarium. 4. Moderate cortical volume loss and scattered small vessel ischemic microangiopathy. 5. Chronic ischemic change at the basal ganglia bilaterally. 6. Mild degenerative change along the cervical spine. 7. Mild calcification at the carotid bifurcations bilaterally. Carotid ultrasound would be helpful for further evaluation, when and as deemed clinically appropriate. Electronically Signed   By: Roanna Raider M.D.   On: 07/19/2015 22:46   Ct Cervical Spine Wo Contrast  07/19/2015  CLINICAL DATA:  Found in hall, lying on right side. Concern for head or cervical spine injury. Initial encounter. EXAM: CT HEAD WITHOUT CONTRAST CT CERVICAL SPINE WITHOUT CONTRAST TECHNIQUE: Multidetector CT imaging of the head and  cervical spine was performed following the standard protocol without intravenous contrast. Multiplanar CT image reconstructions of the cervical spine were also generated. COMPARISON:  CT of the head and cervical spine performed 07/03/2014 FINDINGS: CT HEAD FINDINGS There is no evidence of acute infarction, mass lesion, or intra- or extra-axial hemorrhage on CT. Prominence of the ventricles and sulci reflects moderate cortical volume loss. Cerebellar atrophy is noted. Scattered periventricular and subcortical white matter change likely reflects small vessel ischemic microangiopathy. Chronic ischemic change is noted at the basal ganglia bilaterally. The brainstem and fourth ventricle are within normal limits. The cerebral hemispheres demonstrate grossly normal gray-white differentiation. No mass effect or midline shift is seen. There is no evidence of fracture; visualized osseous structures are unremarkable in appearance. The visualized portions of the orbits are within normal limits. Mucosal thickening is noted at the right ethmoid air cells. The remaining paranasal sinuses and mastoid air cells are well-aerated. Mild soft tissue swelling is noted overlying the left frontal calvarium. CT CERVICAL SPINE FINDINGS There is no evidence of fracture or subluxation. Vertebral bodies demonstrate normal height and alignment. Mild multilevel disc space narrowing is suggested along the cervical spine, with underlying facet disease noted. Prevertebral soft tissues are within normal limits. The thyroid gland is unremarkable in appearance. The visualized lung apices are clear. Mild calcification is noted at the carotid bifurcations bilaterally. IMPRESSION: 1. No evidence of traumatic intracranial injury or fracture. 2. No evidence of fracture or subluxation along the cervical spine. 3. Mild soft tissue swelling overlying the left frontal calvarium. 4. Moderate cortical volume loss and scattered small vessel ischemic  microangiopathy. 5. Chronic ischemic change at the basal ganglia bilaterally. 6. Mild degenerative change along the cervical spine. 7. Mild calcification at the carotid bifurcations bilaterally. Carotid ultrasound would be helpful for further evaluation, when and as deemed clinically appropriate. Electronically Signed   By: Roanna Raider M.D.   On: 07/19/2015 22:46   Dg Hip Unilat With Pelvis 1v Left  07/19/2015  CLINICAL DATA:  Status post fall, with left hip pain. Initial encounter. EXAM: DG HIP (WITH OR WITHOUT PELVIS) 1V*L* COMPARISON:  None. FINDINGS: There is a mildly displaced intertrochanteric fracture of the proximal left femur. The left femoral head remains seated at the acetabulum. The patient's right femoral prosthesis is grossly unremarkable in appearance. Degenerative change is noted at the lower lumbar spine. The visualized bowel gas pattern is grossly unremarkable. IMPRESSION: Mildly displaced intertrochanteric fracture of the proximal left femur. Electronically Signed  By: Roanna Raider M.D.   On: 07/19/2015 23:19   Dg Femur Min 2 Views Left  07/19/2015  CLINICAL DATA:  Status post fall, with left hip pain. Initial encounter. EXAM: LEFT FEMUR 2 VIEWS COMPARISON:  None. FINDINGS: There is a displaced left femoral intertrochanteric fracture. The left femoral head remains seated at the acetabulum. No additional fractures are seen. The left sacroiliac joint is unremarkable in appearance. No definite soft tissue abnormalities are characterized on radiograph. IMPRESSION: Displaced left femoral intertrochanteric fracture. Electronically Signed   By: Roanna Raider M.D.   On: 07/19/2015 23:23    EKG:  Ordered and pending    Assessment/Plan  1. Displaced intertrochanteric fracture of left femur  - Dr. Magnus Ivan of ortho consulting with tentative plans for surgery late afternoon of 07/20/15  - Ice the hip, control pain with IV morphine 2 mg q2h prn  - CXR with no active cardiopulmonary disease;  EKG pending; UA pending; no known Hx of underlying cardiopulmonary disease - VTE ppx with sq heparin, to be held prior to surgery  - NPO  - Case management, social work consults for likely SNF placement and discharge equipment    2. Hypertension  - Has history of such, but normotensive here  - Monitoring off medications for now, will treat prn    3. Hyperkalemia  - K+ 5.2 on admission  - Gently hydrating, gave calcium, insulin with dextrose  - Repeat K+ pending  - Suspected secondary to worsening renal fxn   4. Leukocytosis - Suspect this represents stress demargination in setting of hip fx  - UA not yet obtained, will follow up; no suggestion systemic s/s of infxn, CXR clear  5. CKD stage 3  - SCr 1.44 on arrival; had been higher than that for past 2 years, but 0.8 prior to that  - Suspect this represents a new baseline  - Gently hydrating overnight and will reassess renal fxn in the am  - Avoiding nephrotoxins where possible    6. Dementia  - Prone to delirium  - Treat pain, avoid restraints, frequent reorientation     DVT ppx: SQ Heparin     Code Status: Full code Family Communication:  Yes, patient's son at bed side Disposition Plan: Admit to inpatient   Date of Service 07/20/2015    Briscoe Deutscher, MD [facility] Pager [phone]  If 7PM-7AM, please contact night-coverage [email or web address] Password [email or web address] 07/20/2015, 2:24 AM

## 2015-07-20 NOTE — Progress Notes (Addendum)
Patient Demographics:    [name], is a 82 y.o. female, DOB - 12/11/1920, ZOX:[id number]  Admit date - 07/19/2015   Admitting Physician Briscoe Deutscher, MD  Outpatient Primary MD for the patient is [name], MD  LOS - 0   Chief Complaint  Patient presents with  . Fall        Subjective:    [facility] today has, No headache, No chest pain, No abdominal pain - No Nausea, No new weakness tingling or numbness, No Cough - SOB. He appears pleasantly confused and unreliable historian.   Assessment  & Plan :    1. Mildly displaced intertrochanteric fracture of the proximal left femur - after a mechanical fall, she is due for surgical correction by orthopedics later on 07/20/2015.  Cardio-Pulm Risk stratification for surgery and recommendations to minimize the same:-  A.Cardio-Pulmonary Risk -  this patient is a high risk for adverse Cardio-Pulmonary  Outcome  from surgery, the risks and benefits were discussed and acceptable to the patient and her 2 sons.  Recommendations for optimizing Cardio-Pulmonary  Risk risk factors  1. Keep SBP<140, HR<85, use Lopressor 5mg  IV q4hrs PRN, or IV Hydralazine. 2. Moniotr I&Os. 3. Minimal sedation and Narcotics. 4. Good pulmunary toilet. 5. PRN Nebs and as needed oxygen to keep Pox>90% 6. Hb>8, transfuse as needed- Lasix 10mg  IV after each unit PRBC Transfused.   B.Bleeding Risk - no previous surgical complications, no easy bruising,  Antiplate meds  None.   Lab Results  Component Value Date   PLT 277 07/20/2015                  Lab Results  Component Value Date   INR 1.10 07/20/2015   INR 0.97 07/03/2014      Will request Surgeon to please Order DVT prophylaxis of his/her choice, along with activity, weight bearing precautions and diet if  appropriate.     2. Hypothyroidism. Continue home dose Synthroid.  3. Mild peripheral neuropathy. Continue home dose Neurontin once back from surgery.  4. Mild reactionary leukocytosis. Chest x-ray clear, afebrile, check baseline UA. Denies any dysuria.  5. I'll essential hypertension. As needed hydralazine IV.    Code Status : DO NOT RESUSCITATE  Family Communication  : Both her son's bedside  Disposition Plan  :  SNF  Consults  : Ortho  Procedures  :   CT head and C-spine. Nonacute.   L ORIF Femur By Dr Gaylord Shih for 07-20-15  DVT Prophylaxis  :  Heparin   Lab Results  Component Value Date   PLT 277 07/20/2015    Inpatient Medications  Scheduled Meds: . gabapentin  300 mg Oral TID  . heparin  5,000 Units Subcutaneous 3 times per day  . levothyroxine  100 mcg Oral QAC breakfast   Continuous Infusions: . dextrose 5 % and 0.45% NaCl     PRN Meds:.acetaminophen, bisacodyl, HYDROcodone-acetaminophen, ipratropium-albuterol, morphine injection, senna-docusate, traMADol  Antibiotics  :    Anti-infectives    Start     Dose/Rate Route Frequency Ordered Stop   07/20/15 0800  cefTRIAXone (ROCEPHIN) 1 g in dextrose 5 % 50 mL IVPB  Status:  Discontinued     1 g 100 mL/hr over 30 Minutes  Intravenous  Once 07/20/15 0745 07/20/15 0745        Objective:   Filed Vitals:   07/19/15 2330 07/20/15 0015 07/20/15 0209 07/20/15 0519  BP: 102/65 144/64 109/81 117/89  Pulse: 52 61 61 75  Temp:   98.1 F (36.7 C) 99.2 F (37.3 C)  TempSrc:   Oral Oral  Resp:   18 18  SpO2: 100% 93% 97% 100%    Wt Readings from Last 3 Encounters:  10/07/14 68.04 kg (150 lb)  07/31/14 68.04 kg (150 lb)  07/04/14 68.04 kg (150 lb)    No intake or output data in the 24 hours ending 07/20/15 1125   Physical Exam  Awake , mildly confused, No new F.N deficits, Normal affect [place].AT,PERRAL Supple Neck,No JVD, No cervical lymphadenopathy appriciated.  Symmetrical Chest wall movement, Good air  movement bilaterally, CTAB RRR,No Gallops,Rubs or new Murmurs, No Parasternal Heave +ve B.Sounds, Abd Soft, No tenderness, No organomegaly appriciated, No rebound - guarding or rigidity. No Cyanosis, Clubbing or edema, No new Rash or bruise      Data Review:   Micro Results No results found for this or any previous visit (from the past 240 hour(s)).  Radiology Reports Dg Chest 1 View  07/19/2015  CLINICAL DATA:  82 year old female with fall and left hip pain. EXAM: CHEST 1 VIEW COMPARISON:  Chest radiograph dated 07/03/2014 FINDINGS: The patient is rotated. The lungs are clear. No pleural effusion or pneumothorax. Stable cardiomegaly. The bones are osteopenic. No acute fracture. IMPRESSION: No acute cardiopulmonary process. Electronically Signed   By: Elgie Collard M.D.   On: 07/19/2015 23:20   Dg Knee 2 Views Left  07/19/2015  CLINICAL DATA:  Status post fall, with left hip pain. Initial encounter. EXAM: LEFT KNEE - 1-2 VIEW COMPARISON:  Left lower extremity CT performed 07/05/2003 FINDINGS: There is no evidence of fracture or dislocation. The joint spaces are preserved. Mild marginal osteophyte formation is noted at the medial and lateral compartments. The patellofemoral compartment is grossly unremarkable. No significant joint effusion is seen. The visualized soft tissues are normal in appearance. IMPRESSION: No evidence of fracture or dislocation. Electronically Signed   By: Roanna Raider M.D.   On: 07/19/2015 23:20   Dg Knee 2 Views Right  07/19/2015  CLINICAL DATA:  Status post fall, with concern for right knee injury. Initial encounter. EXAM: RIGHT KNEE - 1-2 VIEW COMPARISON:  Right knee radiographs performed earlier today at 1:36 a.m. FINDINGS: There is no evidence of fracture or dislocation. There is narrowing of the medial compartment, with marginal osteophytes seen arising at all 3 compartments. A small knee joint effusion is suggested. The visualized soft tissues are normal in  appearance. IMPRESSION: 1. No evidence of fracture or dislocation. 2. Mild tricompartmental osteoarthritis, with narrowing of the medial compartment. 3. Small knee joint effusion suggested. Electronically Signed   By: Roanna Raider M.D.   On: 07/19/2015 23:22   Ct Head Wo Contrast  07/19/2015  CLINICAL DATA:  Found in hall, lying on right side. Concern for head or cervical spine injury. Initial encounter. EXAM: CT HEAD WITHOUT CONTRAST CT CERVICAL SPINE WITHOUT CONTRAST TECHNIQUE: Multidetector CT imaging of the head and cervical spine was performed following the standard protocol without intravenous contrast. Multiplanar CT image reconstructions of the cervical spine were also generated. COMPARISON:  CT of the head and cervical spine performed 07/03/2014 FINDINGS: CT HEAD FINDINGS There is no evidence of acute infarction, mass lesion, or intra- or extra-axial hemorrhage on CT.  Prominence of the ventricles and sulci reflects moderate cortical volume loss. Cerebellar atrophy is noted. Scattered periventricular and subcortical white matter change likely reflects small vessel ischemic microangiopathy. Chronic ischemic change is noted at the basal ganglia bilaterally. The brainstem and fourth ventricle are within normal limits. The cerebral hemispheres demonstrate grossly normal gray-white differentiation. No mass effect or midline shift is seen. There is no evidence of fracture; visualized osseous structures are unremarkable in appearance. The visualized portions of the orbits are within normal limits. Mucosal thickening is noted at the right ethmoid air cells. The remaining paranasal sinuses and mastoid air cells are well-aerated. Mild soft tissue swelling is noted overlying the left frontal calvarium. CT CERVICAL SPINE FINDINGS There is no evidence of fracture or subluxation. Vertebral bodies demonstrate normal height and alignment. Mild multilevel disc space narrowing is suggested along the cervical spine, with  underlying facet disease noted. Prevertebral soft tissues are within normal limits. The thyroid gland is unremarkable in appearance. The visualized lung apices are clear. Mild calcification is noted at the carotid bifurcations bilaterally. IMPRESSION: 1. No evidence of traumatic intracranial injury or fracture. 2. No evidence of fracture or subluxation along the cervical spine. 3. Mild soft tissue swelling overlying the left frontal calvarium. 4. Moderate cortical volume loss and scattered small vessel ischemic microangiopathy. 5. Chronic ischemic change at the basal ganglia bilaterally. 6. Mild degenerative change along the cervical spine. 7. Mild calcification at the carotid bifurcations bilaterally. Carotid ultrasound would be helpful for further evaluation, when and as deemed clinically appropriate. Electronically Signed   By: Roanna Raider M.D.   On: 07/19/2015 22:46   Ct Cervical Spine Wo Contrast  07/19/2015  CLINICAL DATA:  Found in hall, lying on right side. Concern for head or cervical spine injury. Initial encounter. EXAM: CT HEAD WITHOUT CONTRAST CT CERVICAL SPINE WITHOUT CONTRAST TECHNIQUE: Multidetector CT imaging of the head and cervical spine was performed following the standard protocol without intravenous contrast. Multiplanar CT image reconstructions of the cervical spine were also generated. COMPARISON:  CT of the head and cervical spine performed 07/03/2014 FINDINGS: CT HEAD FINDINGS There is no evidence of acute infarction, mass lesion, or intra- or extra-axial hemorrhage on CT. Prominence of the ventricles and sulci reflects moderate cortical volume loss. Cerebellar atrophy is noted. Scattered periventricular and subcortical white matter change likely reflects small vessel ischemic microangiopathy. Chronic ischemic change is noted at the basal ganglia bilaterally. The brainstem and fourth ventricle are within normal limits. The cerebral hemispheres demonstrate grossly normal gray-white  differentiation. No mass effect or midline shift is seen. There is no evidence of fracture; visualized osseous structures are unremarkable in appearance. The visualized portions of the orbits are within normal limits. Mucosal thickening is noted at the right ethmoid air cells. The remaining paranasal sinuses and mastoid air cells are well-aerated. Mild soft tissue swelling is noted overlying the left frontal calvarium. CT CERVICAL SPINE FINDINGS There is no evidence of fracture or subluxation. Vertebral bodies demonstrate normal height and alignment. Mild multilevel disc space narrowing is suggested along the cervical spine, with underlying facet disease noted. Prevertebral soft tissues are within normal limits. The thyroid gland is unremarkable in appearance. The visualized lung apices are clear. Mild calcification is noted at the carotid bifurcations bilaterally. IMPRESSION: 1. No evidence of traumatic intracranial injury or fracture. 2. No evidence of fracture or subluxation along the cervical spine. 3. Mild soft tissue swelling overlying the left frontal calvarium. 4. Moderate cortical volume loss and scattered small vessel ischemic  microangiopathy. 5. Chronic ischemic change at the basal ganglia bilaterally. 6. Mild degenerative change along the cervical spine. 7. Mild calcification at the carotid bifurcations bilaterally. Carotid ultrasound would be helpful for further evaluation, when and as deemed clinically appropriate. Electronically Signed   By: Roanna Raider M.D.   On: 07/19/2015 22:46   Dg Hip Unilat With Pelvis 1v Left  07/19/2015  CLINICAL DATA:  Status post fall, with left hip pain. Initial encounter. EXAM: DG HIP (WITH OR WITHOUT PELVIS) 1V*L* COMPARISON:  None. FINDINGS: There is a mildly displaced intertrochanteric fracture of the proximal left femur. The left femoral head remains seated at the acetabulum. The patient's right femoral prosthesis is grossly unremarkable in appearance.  Degenerative change is noted at the lower lumbar spine. The visualized bowel gas pattern is grossly unremarkable. IMPRESSION: Mildly displaced intertrochanteric fracture of the proximal left femur. Electronically Signed   By: Roanna Raider M.D.   On: 07/19/2015 23:19   Dg Femur Min 2 Views Left  07/19/2015  CLINICAL DATA:  Status post fall, with left hip pain. Initial encounter. EXAM: LEFT FEMUR 2 VIEWS COMPARISON:  None. FINDINGS: There is a displaced left femoral intertrochanteric fracture. The left femoral head remains seated at the acetabulum. No additional fractures are seen. The left sacroiliac joint is unremarkable in appearance. No definite soft tissue abnormalities are characterized on radiograph. IMPRESSION: Displaced left femoral intertrochanteric fracture. Electronically Signed   By: Roanna Raider M.D.   On: 07/19/2015 23:23     CBC  Recent Labs Lab 07/19/15 2150 07/20/15 0330  WBC 17.5* 14.1*  HGB 13.0 12.2  HCT 40.4 37.5  PLT 294 277  MCV 91.6 91.0  MCH 29.5 29.6  MCHC 32.2 32.5  RDW 13.5 13.4  LYMPHSABS 2.5  --   MONOABS 0.9  --   EOSABS 0.3  --   BASOSABS 0.1  --     Chemistries   Recent Labs Lab 07/19/15 2150 07/20/15 0330  NA 142 141  K 5.2* 5.0  CL 108 108  CO2 20* 22  GLUCOSE 131* 157*  BUN 28* 27*  CREATININE 1.44* 1.46*  CALCIUM 9.0 9.0   ------------------------------------------------------------------------------------------------------------------ No results for input(s): CHOL, HDL, LDLCALC, TRIG, CHOLHDL, LDLDIRECT in the last 72 hours.  Lab Results  Component Value Date   HGBA1C 5.6 05/20/2014   ------------------------------------------------------------------------------------------------------------------ No results for input(s): TSH, T4TOTAL, T3FREE, THYROIDAB in the last 72 hours.  Invalid input(s): FREET3 ------------------------------------------------------------------------------------------------------------------ No results  for input(s): VITAMINB12, FOLATE, FERRITIN, TIBC, IRON, RETICCTPCT in the last 72 hours.  Coagulation profile  Recent Labs Lab 07/20/15 0330  INR 1.10    No results for input(s): DDIMER in the last 72 hours.  Cardiac Enzymes No results for input(s): CKMB, TROPONINI, MYOGLOBIN in the last 168 hours.  Invalid input(s): CK ------------------------------------------------------------------------------------------------------------------ No results found for: BNP  Time Spent in minutes   35   SINGH,PRASHANT K M.D on 07/20/2015 at 11:25 AM  Between 7am to 7pm - Pager - [phone]  After 7pm go to [email or web address] - password [facility]  [facility] -  Office  [phone]

## 2015-07-20 NOTE — Progress Notes (Signed)
Patient arrived to floor at 0152. RN paged MD per order. Nursing will continue to monitor.

## 2015-07-20 NOTE — Progress Notes (Signed)
Attempted report x 1. Please call [phone]. Thanks, Leilyn Frayre [phone]

## 2015-07-20 NOTE — Brief Op Note (Signed)
07/19/2015 - 07/20/2015  7:10 PM  PATIENT:  [name]  82 y.o. female  PRE-OPERATIVE DIAGNOSIS:  Intertroch fracture left hip  POST-OPERATIVE DIAGNOSIS:  * No post-op diagnosis entered *  PROCEDURE:  Procedure(s): INTRAMEDULLARY (IM) NAIL INTERTROCHANTRIC (Left)  SURGEON:  Surgeon(s) and Role:    * Kathryne Hitch, MD - Primary  PHYSICIAN ASSISTANT: Rexene Edison, PA-C  ANESTHESIA:   spinal  EBL:   < 200 cc  COUNTS:  YES  DICTATION: .Other Dictation: Dictation Number [id number]  PLAN OF CARE: Admit to inpatient   PATIENT DISPOSITION:  PACU - hemodynamically stable.   Delay start of Pharmacological VTE agent (>24hrs) due to surgical blood loss or risk of bleeding: no

## 2015-07-20 NOTE — Progress Notes (Signed)
Ring given to family.

## 2015-07-20 NOTE — Anesthesia Preprocedure Evaluation (Signed)
Anesthesia Evaluation  Patient identified by MRN, date of birth, ID band  Reviewed: Allergy & Precautions, NPO status , Patient's Chart, lab work & pertinent test results  Airway Mallampati: I  TM Distance: >3 FB     Dental   Pulmonary    Pulmonary exam normal        Cardiovascular hypertension, Normal cardiovascular exam     Neuro/Psych    GI/Hepatic hiatal hernia,   Endo/Other  Hypothyroidism   Renal/GU Renal disease     Musculoskeletal  (+) Arthritis ,   Abdominal   Peds  Hematology   Anesthesia Other Findings   Reproductive/Obstetrics                             Anesthesia Physical Anesthesia Plan  ASA: III  Anesthesia Plan: Spinal   Post-op Pain Management:    Induction: Intravenous  Airway Management Planned: Mask  Additional Equipment:   Intra-op Plan:   Post-operative Plan:   Informed Consent: I have reviewed the patients History and Physical, chart, labs and discussed the procedure including the risks, benefits and alternatives for the proposed anesthesia with the patient or authorized representative who has indicated his/her understanding and acceptance.     Plan Discussed with: CRNA, Anesthesiologist and Surgeon  Anesthesia Plan Comments:         Anesthesia Quick Evaluation

## 2015-07-20 NOTE — Progress Notes (Signed)
Initial Nutrition Assessment  DOCUMENTATION CODES:   Not applicable  INTERVENTION:  Once diet advances, provide Ensure Enlive po once daily, each supplement provides 350 kcal and 20 grams of protein.  Recommend obtaining new weight to fully assess weight trends.   NUTRITION DIAGNOSIS:   Increased nutrient needs related to  (surgery) as evidenced by estimated needs.  GOAL:   Patient will meet greater than or equal to 90% of their needs  MONITOR:   Diet advancement, Supplement acceptance, Skin, Weight trends, Labs, I & O's  REASON FOR ASSESSMENT:   Consult Wound healing  ASSESSMENT:   82 y.o. female with PMH of hypertension, hypothyroidism, and right hip fracture approximately one year ago who presents to the ED after being found on the floor by family complaining of left hip pain.   Pt is NPO for surgery today. Pt was asleep during time of visit and did not wake. Family at bedside. They report pt eats well PTA with no other difficulties. Noted no recent weight recorded, however family reports weight has been stable at 150 lbs. Recommend obtaining new weight to fully assess weight trends. RD to order Ensure to aid in healing once diet advances. Pt with no observed significant fat or muscle mass loss.   Labs and medications reviewed.   Diet Order:  Diet NPO time specified  Skin:  Reviewed, no issues  Last BM:  Unknown  Height:   Ht Readings from Last 1 Encounters:  10/07/14  (1.549 m)    Weight:   Wt Readings from Last 1 Encounters:  10/07/14 150 lb (68.04 kg)    Ideal Body Weight:  47.7 kg  BMI:  There is no weight on file to calculate BMI.  Estimated Nutritional Needs:   Kcal:  1550-1750  Protein:  65-80 grams  Fluid:  >/= 1.5 L/day  EDUCATION NEEDS:   No education needs identified at this time  Roslyn Smiling, MS, RD, LDN Pager # [phone] After hours/ weekend pager # [phone]

## 2015-07-20 NOTE — Consult Note (Signed)
Reason for Consult:  Left hip fracture Referring Physician:   EDP  [name] is an 82 y.o. female.  HPI:   82 yo female who sustained an accidental mechanical fall last evening.  Has multiple medical problems and some dementia.  Was brought to [facility] ED and found to have a left hip intertrochanteric fracture.  Both Ortho and [facility] are consulted.  Past Medical History  Diagnosis Date  . Hypothyroidism   . Shortness of breath   . Schatzki's ring   . Hypertension   . Vertebral compression fracture ([place])   . Arthritis     right knee  . Peripheral neuropathy ([place])   . Hiatal hernia     Past Surgical History  Procedure Laterality Date  . Esophagogastroduodenoscopy  10/24/08    normal, status post [facility] dilator, small hiatal hernia deformity of the antrum/pylorus  . Colonoscopy  10/24/08    anal papilla otherwise normal, pan colonic diverticulum and colonic mucosa  . Appendectomy  1941  . Abdominal hysterectomy  1977  . Cholecystectomy  1967  . Spine surgery  1986  . Knee surgery  09/2008  . Hemorrhoid surgery  1999  . Colonoscopy  remote    Dr. Leane Para Smith-->polpys per patient  . Ercp  1995    Dr. Rourk--> for dilated biliary tree. no stone or neoplasm or stricture found  . Esophagogastroduodenoscopy  1995    Dr. Rourk--> empiric dilation of esophagus with resolution of dysphagia  . Esophagogastroduodenoscopy   09/21/2011    RMR: Noncritical Schatzki's ring s/p dilated/Abnormal esophageal mucosa/Small  hiatal hermia  . Shoulder surgery    . Cystoscopy with stent placement Bilateral 05/20/2014    Procedure: CYSTOSCOPY WITH BILATERAL DOUBLE J STENT PLACEMENT;  Surgeon: Jorja Loa, MD;  Location: [facility];  Service: Urology;  Laterality: Bilateral;  . Holmium laser application Bilateral 7/82/9562    Procedure: HOLMIUM LASER APPLICATION;  Surgeon: Jorja Loa, MD;  Location: [facility];  Service: Urology;  Laterality: Bilateral;  . Hip arthroplasty Right  07/04/2014    Procedure: RIGHT PARTIAL HIP REPLACEMENT;  Surgeon: Carole Civil, MD;  Location: [facility];  Service: Orthopedics;  Laterality: Right;    Family History  Problem Relation Age of Onset  . Heart attack Father 72    deceased    Social History:  reports that she has never smoked. She does not have any smokeless tobacco history on file. She reports that she does not drink alcohol or use illicit drugs.  Allergies:  Allergies  Allergen Reactions  . Lactulose     REACTION: unknown reaction  . Penicillins     REACTION: unknown reaction  . Prilosec [Omeprazole] Itching and Rash    Medications: I have reviewed the patient's current medications.  Results for orders placed or performed during the hospital encounter of 07/19/15 (from the past 48 hour(s))  CBC with Differential     Status: Abnormal   Collection Time: 07/19/15  9:50 PM  Result Value Ref Range   WBC 17.5 (H) 4.0 - 10.5 K/uL   RBC 4.41 3.87 - 5.11 MIL/uL   Hemoglobin 13.0 12.0 - 15.0 g/dL   HCT 40.4 36.0 - 46.0 %   MCV 91.6 78.0 - 100.0 fL   MCH 29.5 26.0 - 34.0 pg   MCHC 32.2 30.0 - 36.0 g/dL   RDW 13.5 11.5 - 15.5 %   Platelets 294 150 - 400 K/uL   Neutrophils Relative % 79 %  Neutro Abs 13.8 (H) 1.7 - 7.7 K/uL   Lymphocytes Relative 14 %   Lymphs Abs 2.5 0.7 - 4.0 K/uL   Monocytes Relative 5 %   Monocytes Absolute 0.9 0.1 - 1.0 K/uL   Eosinophils Relative 2 %   Eosinophils Absolute 0.3 0.0 - 0.7 K/uL   Basophils Relative 0 %   Basophils Absolute 0.1 0.0 - 0.1 K/uL  Basic metabolic panel     Status: Abnormal   Collection Time: 07/19/15  9:50 PM  Result Value Ref Range   Sodium 142 135 - 145 mmol/L   Potassium 5.2 (H) 3.5 - 5.1 mmol/L   Chloride 108 101 - 111 mmol/L   CO2 20 (L) 22 - 32 mmol/L   Glucose, Bld 131 (H) 65 - 99 mg/dL   BUN 28 (H) 6 - 20 mg/dL   Creatinine, Ser 1.44 (H) 0.44 - 1.00 mg/dL   Calcium 9.0 8.9 - 10.3 mg/dL   GFR calc non Af Amer 31 (L) >60 mL/min   GFR calc Af Amer 35  (L) >60 mL/min    Comment: (NOTE) The eGFR has been calculated using the CKD EPI equation. This calculation has not been validated in all clinical situations. eGFR's persistently <60 mL/min signify possible Chronic Kidney Disease.    Anion gap 14 5 - 15  Glucose, capillary     Status: Abnormal   Collection Time: 07/20/15  3:24 AM  Result Value Ref Range   Glucose-Capillary 139 (H) 65 - 99 mg/dL  Protime-INR     Status: None   Collection Time: 07/20/15  3:30 AM  Result Value Ref Range   Prothrombin Time 14.4 11.6 - 15.2 seconds   INR 1.10 0.00 - 1.49  APTT     Status: None   Collection Time: 07/20/15  3:30 AM  Result Value Ref Range   aPTT 29 24 - 37 seconds  CBC     Status: Abnormal   Collection Time: 07/20/15  3:30 AM  Result Value Ref Range   WBC 14.1 (H) 4.0 - 10.5 K/uL   RBC 4.12 3.87 - 5.11 MIL/uL   Hemoglobin 12.2 12.0 - 15.0 g/dL   HCT 37.5 36.0 - 46.0 %   MCV 91.0 78.0 - 100.0 fL   MCH 29.6 26.0 - 34.0 pg   MCHC 32.5 30.0 - 36.0 g/dL   RDW 13.4 11.5 - 15.5 %   Platelets 277 150 - 400 K/uL  Basic metabolic panel     Status: Abnormal   Collection Time: 07/20/15  3:30 AM  Result Value Ref Range   Sodium 141 135 - 145 mmol/L   Potassium 5.0 3.5 - 5.1 mmol/L   Chloride 108 101 - 111 mmol/L   CO2 22 22 - 32 mmol/L   Glucose, Bld 157 (H) 65 - 99 mg/dL   BUN 27 (H) 6 - 20 mg/dL   Creatinine, Ser 1.46 (H) 0.44 - 1.00 mg/dL   Calcium 9.0 8.9 - 10.3 mg/dL   GFR calc non Af Amer 30 (L) >60 mL/min   GFR calc Af Amer 35 (L) >60 mL/min    Comment: (NOTE) The eGFR has been calculated using the CKD EPI equation. This calculation has not been validated in all clinical situations. eGFR's persistently <60 mL/min signify possible Chronic Kidney Disease.    Anion gap 11 5 - 15  Type and screen [place]     Status: None   Collection Time: 07/20/15  3:30 AM  Result Value Ref  Range   ABO/RH(D) O NEG    Antibody Screen NEG    Sample Expiration 07/23/2015    ABO/Rh     Status: None (Preliminary result)   Collection Time: 07/20/15  3:30 AM  Result Value Ref Range   ABO/RH(D) O NEG   Glucose, capillary     Status: Abnormal   Collection Time: 07/20/15  6:44 AM  Result Value Ref Range   Glucose-Capillary 61 (L) 65 - 99 mg/dL  Glucose, capillary     Status: None   Collection Time: 07/20/15  7:14 AM  Result Value Ref Range   Glucose-Capillary 96 65 - 99 mg/dL    Dg Chest 1 View  07/19/2015  CLINICAL DATA:  82 year old female with fall and left hip pain. EXAM: CHEST 1 VIEW COMPARISON:  Chest radiograph dated 07/03/2014 FINDINGS: The patient is rotated. The lungs are clear. No pleural effusion or pneumothorax. Stable cardiomegaly. The bones are osteopenic. No acute fracture. IMPRESSION: No acute cardiopulmonary process. Electronically Signed   By: Anner Crete M.D.   On: 07/19/2015 23:20   Dg Knee 2 Views Left  07/19/2015  CLINICAL DATA:  Status post fall, with left hip pain. Initial encounter. EXAM: LEFT KNEE - 1-2 VIEW COMPARISON:  Left lower extremity CT performed 07/05/2003 FINDINGS: There is no evidence of fracture or dislocation. The joint spaces are preserved. Mild marginal osteophyte formation is noted at the medial and lateral compartments. The patellofemoral compartment is grossly unremarkable. No significant joint effusion is seen. The visualized soft tissues are normal in appearance. IMPRESSION: No evidence of fracture or dislocation. Electronically Signed   By: Garald Balding M.D.   On: 07/19/2015 23:20   Dg Knee 2 Views Right  07/19/2015  CLINICAL DATA:  Status post fall, with concern for right knee injury. Initial encounter. EXAM: RIGHT KNEE - 1-2 VIEW COMPARISON:  Right knee radiographs performed earlier today at 1:36 a.m. FINDINGS: There is no evidence of fracture or dislocation. There is narrowing of the medial compartment, with marginal osteophytes seen arising at all 3 compartments. A small knee joint effusion is suggested. The  visualized soft tissues are normal in appearance. IMPRESSION: 1. No evidence of fracture or dislocation. 2. Mild tricompartmental osteoarthritis, with narrowing of the medial compartment. 3. Small knee joint effusion suggested. Electronically Signed   By: Garald Balding M.D.   On: 07/19/2015 23:22   Ct Head Wo Contrast  07/19/2015  CLINICAL DATA:  Found in hall, lying on right side. Concern for head or cervical spine injury. Initial encounter. EXAM: CT HEAD WITHOUT CONTRAST CT CERVICAL SPINE WITHOUT CONTRAST TECHNIQUE: Multidetector CT imaging of the head and cervical spine was performed following the standard protocol without intravenous contrast. Multiplanar CT image reconstructions of the cervical spine were also generated. COMPARISON:  CT of the head and cervical spine performed 07/03/2014 FINDINGS: CT HEAD FINDINGS There is no evidence of acute infarction, mass lesion, or intra- or extra-axial hemorrhage on CT. Prominence of the ventricles and sulci reflects moderate cortical volume loss. Cerebellar atrophy is noted. Scattered periventricular and subcortical white matter change likely reflects small vessel ischemic microangiopathy. Chronic ischemic change is noted at the basal ganglia bilaterally. The brainstem and fourth ventricle are within normal limits. The cerebral hemispheres demonstrate grossly normal gray-white differentiation. No mass effect or midline shift is seen. There is no evidence of fracture; visualized osseous structures are unremarkable in appearance. The visualized portions of the orbits are within normal limits. Mucosal thickening is noted at the right ethmoid air  cells. The remaining paranasal sinuses and mastoid air cells are well-aerated. Mild soft tissue swelling is noted overlying the left frontal calvarium. CT CERVICAL SPINE FINDINGS There is no evidence of fracture or subluxation. Vertebral bodies demonstrate normal height and alignment. Mild multilevel disc space narrowing is  suggested along the cervical spine, with underlying facet disease noted. Prevertebral soft tissues are within normal limits. The thyroid gland is unremarkable in appearance. The visualized lung apices are clear. Mild calcification is noted at the carotid bifurcations bilaterally. IMPRESSION: 1. No evidence of traumatic intracranial injury or fracture. 2. No evidence of fracture or subluxation along the cervical spine. 3. Mild soft tissue swelling overlying the left frontal calvarium. 4. Moderate cortical volume loss and scattered small vessel ischemic microangiopathy. 5. Chronic ischemic change at the basal ganglia bilaterally. 6. Mild degenerative change along the cervical spine. 7. Mild calcification at the carotid bifurcations bilaterally. Carotid ultrasound would be helpful for further evaluation, when and as deemed clinically appropriate. Electronically Signed   By: Garald Balding M.D.   On: 07/19/2015 22:46   Ct Cervical Spine Wo Contrast  07/19/2015  CLINICAL DATA:  Found in hall, lying on right side. Concern for head or cervical spine injury. Initial encounter. EXAM: CT HEAD WITHOUT CONTRAST CT CERVICAL SPINE WITHOUT CONTRAST TECHNIQUE: Multidetector CT imaging of the head and cervical spine was performed following the standard protocol without intravenous contrast. Multiplanar CT image reconstructions of the cervical spine were also generated. COMPARISON:  CT of the head and cervical spine performed 07/03/2014 FINDINGS: CT HEAD FINDINGS There is no evidence of acute infarction, mass lesion, or intra- or extra-axial hemorrhage on CT. Prominence of the ventricles and sulci reflects moderate cortical volume loss. Cerebellar atrophy is noted. Scattered periventricular and subcortical white matter change likely reflects small vessel ischemic microangiopathy. Chronic ischemic change is noted at the basal ganglia bilaterally. The brainstem and fourth ventricle are within normal limits. The cerebral hemispheres  demonstrate grossly normal gray-white differentiation. No mass effect or midline shift is seen. There is no evidence of fracture; visualized osseous structures are unremarkable in appearance. The visualized portions of the orbits are within normal limits. Mucosal thickening is noted at the right ethmoid air cells. The remaining paranasal sinuses and mastoid air cells are well-aerated. Mild soft tissue swelling is noted overlying the left frontal calvarium. CT CERVICAL SPINE FINDINGS There is no evidence of fracture or subluxation. Vertebral bodies demonstrate normal height and alignment. Mild multilevel disc space narrowing is suggested along the cervical spine, with underlying facet disease noted. Prevertebral soft tissues are within normal limits. The thyroid gland is unremarkable in appearance. The visualized lung apices are clear. Mild calcification is noted at the carotid bifurcations bilaterally. IMPRESSION: 1. No evidence of traumatic intracranial injury or fracture. 2. No evidence of fracture or subluxation along the cervical spine. 3. Mild soft tissue swelling overlying the left frontal calvarium. 4. Moderate cortical volume loss and scattered small vessel ischemic microangiopathy. 5. Chronic ischemic change at the basal ganglia bilaterally. 6. Mild degenerative change along the cervical spine. 7. Mild calcification at the carotid bifurcations bilaterally. Carotid ultrasound would be helpful for further evaluation, when and as deemed clinically appropriate. Electronically Signed   By: Garald Balding M.D.   On: 07/19/2015 22:46   Dg Hip Unilat With Pelvis 1v Left  07/19/2015  CLINICAL DATA:  Status post fall, with left hip pain. Initial encounter. EXAM: DG HIP (WITH OR WITHOUT PELVIS) 1V*L* COMPARISON:  None. FINDINGS: There is a mildly displaced  intertrochanteric fracture of the proximal left femur. The left femoral head remains seated at the acetabulum. The patient's right femoral prosthesis is grossly  unremarkable in appearance. Degenerative change is noted at the lower lumbar spine. The visualized bowel gas pattern is grossly unremarkable. IMPRESSION: Mildly displaced intertrochanteric fracture of the proximal left femur. Electronically Signed   By: Garald Balding M.D.   On: 07/19/2015 23:19   Dg Femur Min 2 Views Left  07/19/2015  CLINICAL DATA:  Status post fall, with left hip pain. Initial encounter. EXAM: LEFT FEMUR 2 VIEWS COMPARISON:  None. FINDINGS: There is a displaced left femoral intertrochanteric fracture. The left femoral head remains seated at the acetabulum. No additional fractures are seen. The left sacroiliac joint is unremarkable in appearance. No definite soft tissue abnormalities are characterized on radiograph. IMPRESSION: Displaced left femoral intertrochanteric fracture. Electronically Signed   By: Garald Balding M.D.   On: 07/19/2015 23:23    ROS Blood pressure 117/89, pulse 75, temperature 99.2 F (37.3 C), temperature source Oral, resp. rate 18, SpO2 100 %. Physical Exam  Constitutional: She appears well-developed and well-nourished.  HENT:  Head: Normocephalic and atraumatic.  Eyes: Pupils are equal, round, and reactive to light.  Cardiovascular: Normal rate.   Respiratory: Effort normal and breath sounds normal.  GI: Soft. Bowel sounds are normal.  Musculoskeletal:       Left hip: She exhibits decreased range of motion, decreased strength, tenderness, bony tenderness and deformity.  Neurological: She is alert.  Skin: Skin is warm and dry.    Assessment/Plan: Left displaced intertrochanteric femur/hip fracture 1)  Will proceed to the OR late this afternoon for open reduction/internal fixation of her left hip.  This is certainly for quality of life purposes - pain control, mobility, etc.  Hatem Cull Y 07/20/2015, 7:35 AM

## 2015-07-21 ENCOUNTER — Encounter (HOSPITAL_COMMUNITY): Payer: Self-pay | Admitting: Orthopaedic Surgery

## 2015-07-21 DIAGNOSIS — N183 Chronic kidney disease, stage 3 (moderate): Secondary | ICD-10-CM

## 2015-07-21 LAB — BASIC METABOLIC PANEL
Anion gap: 11 (ref 5–15)
BUN: 25 mg/dL — ABNORMAL HIGH (ref 6–20)
CALCIUM: 8.8 mg/dL — AB (ref 8.9–10.3)
CO2: 24 mmol/L (ref 22–32)
CREATININE: 1.45 mg/dL — AB (ref 0.44–1.00)
Chloride: 106 mmol/L (ref 101–111)
GFR calc non Af Amer: 30 mL/min — ABNORMAL LOW (ref >60)
GFR, EST AFRICAN AMERICAN: 35 mL/min — AB (ref >60)
Glucose, Bld: 147 mg/dL — ABNORMAL HIGH (ref 65–99)
Potassium: 4.9 mmol/L (ref 3.5–5.1)
Sodium: 141 mmol/L (ref 135–145)

## 2015-07-21 LAB — URINALYSIS, ROUTINE W REFLEX MICROSCOPIC
BILIRUBIN URINE: NEGATIVE
GLUCOSE, UA: NEGATIVE mg/dL
KETONES UR: NEGATIVE mg/dL
Nitrite: POSITIVE — AB
PH: 5.5 (ref 5.0–8.0)
Protein, ur: 30 mg/dL — AB
Specific Gravity, Urine: 1.016 (ref 1.005–1.030)

## 2015-07-21 LAB — CBC
HEMATOCRIT: 34.8 % — AB (ref 36.0–46.0)
Hemoglobin: 10.7 g/dL — ABNORMAL LOW (ref 12.0–15.0)
MCH: 27.8 pg (ref 26.0–34.0)
MCHC: 30.7 g/dL (ref 30.0–36.0)
MCV: 90.4 fL (ref 78.0–100.0)
PLATELETS: 235 10*3/uL (ref 150–400)
RBC: 3.85 MIL/uL — ABNORMAL LOW (ref 3.87–5.11)
RDW: 13.5 % (ref 11.5–15.5)
WBC: 12.1 10*3/uL — ABNORMAL HIGH (ref 4.0–10.5)

## 2015-07-21 LAB — URINE MICROSCOPIC-ADD ON
Bacteria, UA: NONE SEEN
RBC / HPF: NONE SEEN RBC/hpf (ref 0–5)

## 2015-07-21 LAB — MAGNESIUM: Magnesium: 1.6 mg/dL — ABNORMAL LOW (ref 1.7–2.4)

## 2015-07-21 LAB — VITAMIN D 25 HYDROXY (VIT D DEFICIENCY, FRACTURES): VIT D 25 HYDROXY: 10.8 ng/mL — AB (ref 30.0–100.0)

## 2015-07-21 MED ORDER — HYDROCODONE-ACETAMINOPHEN 5-325 MG PO TABS
1.0000 | ORAL_TABLET | Freq: Four times a day (QID) | ORAL | Status: DC | PRN
  Administered 2015-07-21 – 2015-07-22 (×2): 1 via ORAL
  Filled 2015-07-21 (×2): qty 1

## 2015-07-21 MED ORDER — HYDROCODONE-ACETAMINOPHEN 5-325 MG PO TABS: 1.0000 | ORAL_TABLET | ORAL | Status: DC | PRN

## 2015-07-21 MED ORDER — OXYCODONE HCL 5 MG PO TABS: 5.0000 mg | ORAL_TABLET | ORAL | Status: DC | PRN

## 2015-07-21 MED ORDER — ASPIRIN 325 MG PO TBEC: 325.0000 mg | DELAYED_RELEASE_TABLET | Freq: Two times a day (BID) | ORAL | Status: DC

## 2015-07-21 MED ORDER — MIDODRINE HCL 2.5 MG PO TABS
2.5000 mg | ORAL_TABLET | Freq: Three times a day (TID) | ORAL | Status: DC
  Administered 2015-07-21 – 2015-07-22 (×3): 2.5 mg via ORAL
  Filled 2015-07-21 (×6): qty 1

## 2015-07-21 MED ORDER — PHENAZOPYRIDINE HCL 100 MG PO TABS
100.0000 mg | ORAL_TABLET | Freq: Three times a day (TID) | ORAL | Status: DC
  Filled 2015-07-21 (×3): qty 1

## 2015-07-21 NOTE — Progress Notes (Signed)
Subjective: 1 Day Post-Op Procedure(s) (LRB): INTRAMEDULLARY (IM) NAIL INTERTROCHANTRIC LEFT HIP (Left) Patient reports pain as moderate.  Acute blood loss anemia from surgery, but tolerating well.  Objective: Vital signs in last 24 hours: Temp:  [97.6 F (36.4 C)-99.3 F (37.4 C)] 98.7 F (37.1 C) (02/07 0426) Pulse Rate:  [60-76] 65 (02/07 0426) Resp:  [14-22] 17 (02/07 0426) BP: (97-159)/(45-81) 159/80 mmHg (02/07 0426) SpO2:  [99 %-100 %] 100 % (02/07 0426)  Intake/Output from previous day: 02/06 0701 - 02/07 0700 In: 1400 [I.V.:1400] Out: 750 [Urine:700; Blood:50] Intake/Output this shift:     Recent Labs  07/19/15 2150 07/20/15 0330 07/21/15 0320  HGB 13.0 12.2 10.7*    Recent Labs  07/20/15 0330 07/21/15 0320  WBC 14.1* 12.1*  RBC 4.12 3.85*  HCT 37.5 34.8*  PLT 277 235    Recent Labs  07/20/15 0330 07/21/15 0320  NA 141 141  K 5.0 4.9  CL 108 106  CO2 22 24  BUN 27* 25*  CREATININE 1.46* 1.45*  GLUCOSE 157* 147*  CALCIUM 9.0 8.8*    Recent Labs  07/20/15 0330  INR 1.10    Sensation intact distally Intact pulses distally Dorsiflexion/Plantar flexion intact Incision: scant drainage  Assessment/Plan: 1 Day Post-Op Procedure(s) (LRB): INTRAMEDULLARY (IM) NAIL INTERTROCHANTRIC LEFT HIP (Left) Up with therapy  - WBAT left hip Will likely need short-term SNF placement  Monroe Qin Y 07/21/2015, 7:29 AM

## 2015-07-21 NOTE — Op Note (Signed)
[name]                   ACCOUNT NO.:  [id number]  MEDICAL RECORD NO.:  [id number]  LOCATION:  [id number]                        FACILITY:  [facility]  PHYSICIAN:  Vanita Panda. Magnus Ivan, M.D.DATE OF BIRTH:  1921/03/15  DATE OF PROCEDURE:  07/19/2015 DATE OF DISCHARGE:                              OPERATIVE REPORT   PREOPERATIVE DIAGNOSIS:  Displaced left intertrochanteric hip/femur fracture.  POSTOPERATIVE DIAGNOSIS:  Displaced left intertrochanteric hip/femur fracture.  PROCEDURE:  Open reduction and internal fixation of left intertrochanteric hip fracture.  IMPLANTS:  [employer] left INTERTAN femoral nail measuring 10 x 34 with 100/95 lagging compression screw.  SURGEON:  Vanita Panda. Magnus Ivan, MD  ASSISTANT:  Richardean Canal, PA-C  ANESTHESIA:  Spinal.  ANTIBIOTICS:  2 g IV Ancef.  BLOOD LOSS:  Less than 200 mL.  COMPLICATIONS:  None.  INDICATIONS:  [name] is a 82 year old female, who sustained a mechanical fall at home yesterday.  She was admitted to the hospitalist system and cleared for surgery after having been found a left intertrochanteric displaced femur fracture.  She certainly had a significant fracture. She had a right hip fracture before and that was treated with hemiarthroplasty.  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  PROCEDURE DESCRIPTION:  After informed consent was obtained, appropriate left hip was marked.  She was brought to the operating room where spinal anesthesia was obtained while she was on her stretcher.  She was then placed supine on the fracture table with her left leg in in-line skeletal traction.  A perineal post in place and the right hip abducted and placed in a stirrup in a flexed position with appropriate padding of the popliteal area.  We then assessed the  fracture under direct fluoroscopy with traction and internal rotation, reducing the fracture. We then chose our femoral nail under direct fluoroscopic guidance keeping it in the sterile packaging to choose a 10 x 34 rod.  We then prepped the left hip with DuraPrep and sterile drapes.  A time-out was called and she was identified as correct patient, correct left hip.  We then made small incision just proximal to the greater trochanter.  I inserted a guide pin at the tip of greater trochanter down to the level of lesser trochanter.  We then used initiating reamer up in the femoral canal and then passed our femoral rod without needing to ream all the way down the canal.  Using the outrigger guide, we made a separate incision laterally and then placed a guide pin and took measurements to drill for 100 mm lag screw and our 95 mm compression screw.  When we compressed the fracture, it did compress and collapse nicely.  We then removed all instrumentation from the hip and verified the placement under direct fluoroscopy.  We then irrigated two small wounds with normal saline solution.  We closed the deep tissue with 0 Vicryl, followed by 2-0 Vicryl subcutaneous tissue, interrupted staples on the skin.  Xeroform and well-padded sterile dressing was applied.  She was taken off  the fracture table and taken to recovery room in stable condition.  All final counts were correct.  There were no complications noted.  Of note Richardean Canal, PA-C assisted the entire case.  His assistance was crucial for facilitating all aspects of this case.     Vanita Panda. Magnus Ivan, M.D.     CYB/MEDQ  D:  07/20/2015  T:  07/21/2015  Job:  [id number]

## 2015-07-21 NOTE — Care Management Note (Signed)
Case Management Note  Patient Details  Name: [name] MRN: [id number] Date of Birth: 12-Jun-1921  Subjective/Objective:         Admitted with left hip fracture, s/p IM nail left hip           Action/Plan: PT recommended SNF, referral made to CSW. CSW working on SNF for rehab. Will continue to follow. Expected Discharge Date:                  Expected Discharge Plan:  Skilled Nursing Facility  In-House Referral:  Clinical Social Work  Discharge planning Services  CM Consult  Post Acute Care Choice:    Choice offered to:     DME Arranged:    DME Agency:     HH Arranged:    HH Agency:     Status of Service:  In process, will continue to follow  Medicare Important Message Given:    Date Medicare IM Given:    Medicare IM give by:    Date Additional Medicare IM Given:    Additional Medicare Important Message give by:     If discussed at Long Length of Stay Meetings, dates discussed:    Additional Comments:  [name], Hurd, RN 07/21/2015, 4:11 PM

## 2015-07-21 NOTE — Anesthesia Postprocedure Evaluation (Signed)
Anesthesia Post Note  Patient: [name]  Procedure(s) Performed: Procedure(s) (LRB): INTRAMEDULLARY (IM) NAIL INTERTROCHANTRIC LEFT HIP (Left)  Patient location during evaluation: PACU Anesthesia Type: Spinal Level of consciousness: awake Pain management: pain level controlled Respiratory status: spontaneous breathing Cardiovascular status: stable Postop Assessment: spinal receding Anesthetic complications: no    Last Vitals:  Filed Vitals:   07/20/15 2045 07/20/15 2100  BP: 119/66 134/74  Pulse: 62 63  Temp:  37 C  Resp: 18 22    Last Pain:  Filed Vitals:   07/20/15 2322  PainSc: Asleep                 EDWARDS,Nickey Kloepfer

## 2015-07-21 NOTE — Clinical Social Work Note (Signed)
Clinical Social Work Assessment  Patient Details  Name: [name] MRN: [id number] Date of Birth: [birth year]  Date of referral:  07/21/15               Reason for consult:  Facility Placement, Discharge Planning                Permission sought to share information with:  Family Supports Permission granted to share information::  Yes, Verbal Permission Granted  Name::      (son, Reita Cliche: [phone])  Agency::   (SNF)  Relationship::   (son)  Contact Information:   [phone])  Housing/Transportation Living arrangements for the past 2 months:  Skilled Building surveyor of Information:  Adult Children Patient Interpreter Needed:  None Criminal Activity/Legal Involvement Pertinent to Current Situation/Hospitalization:  No - Comment as needed Significant Relationships:  Adult Children Lives with:  Adult Children Do you feel safe going back to the place where you live?  No Need for family participation in patient care:  Yes (Comment)  Care giving concerns:    Patient's family has not expressed any care gving concerns at this time.   Social Worker assessment / plan:   Patient disoriented x4. BSW intern has spoken with patient's daughter, Boyd Kerbs by phone to discuss discharge planning. BSW intern discussed SNF process as well as insurance coverage with patient's daughter. Patient's daughter was informed of the SNF facilities within [facility]. Patient's daughter made BSW intern aware that patient lives at home with son, Reita Cliche. Patient's son is retired and will be providing 24 hour supervision once patient is d/c from SNF. BSW intern to fax patient's clinical to SNF in [place]. BSW intern to follow up with patient's family with extended bed offers once clinical have been reviewed by SNF. BSW intern remains available if any further Social Work needs arises.  Employment status:  Retired Health and safety inspector:  [employer] PT Recommendations:  Skilled Nursing  Facility Information / Referral to community resources:  Skilled Nursing Facility  Patient/Family's Response to care:   Patient's daughter appreciated Social Work intervention given by [employer].  Patient/Family's Understanding of and Emotional Response to Diagnosis, Current Treatment, and Prognosis:   Patient's family understands, and was agreeable to further medical care and rehab at [facility].  Emotional Assessment Appearance:   (unable to assess) Attitude/Demeanor/Rapport:  Unable to Assess Affect (typically observed):  Unable to Assess Orientation:   (disoriented x4) Alcohol / Substance use:  Not Applicable Psych involvement (Current and /or in the community):  No (Comment)  Discharge Needs  Concerns to be addressed:  No discharge needs identified Readmission within the last 30 days:  No Current discharge risk:  None Barriers to Discharge:  No Barriers Identified   Orson Gear, Student-SW 07/21/2015, 2:19 PM

## 2015-07-21 NOTE — Evaluation (Signed)
Physical Therapy Evaluation Patient Details Name: [name] MRN: [id number] DOB: June 11, 1921 Today's Date: 07/21/2015   History of Present Illness  82 yo admitted after fall at home with left hip fx s/p IM nail. PMHx: dementia, hypothyroidism, HTN, with fall with Right hip fx one year ago  Clinical Impression  Pt on arrival in room with son with her head against rail. Pt unable to answer any questions or follow commands. Pt with pain with all movement and resisting attempts to sit or mobilize further. Son present throughout and encouraged to perform any extremity movement with pt throughout the day. Pt limited by pain and cognition preventing progression of function. Pt with decreased strength, transfers, cognition, and increased pain who will benefit from acute therapy to maximize mobility, strength and function to decrease burden of care .     Follow Up Recommendations SNF;Supervision/Assistance - 24 hour    Equipment Recommendations  None recommended by PT    Recommendations for Other Services       Precautions / Restrictions Precautions Precautions: Fall Restrictions LLE Weight Bearing: Weight bearing as tolerated      Mobility  Bed Mobility Overal bed mobility: Needs Assistance;+2 for physical assistance Bed Mobility: Supine to Sit;Sit to Supine     Supine to sit: Total assist;+2 for physical assistance Sit to supine: Total assist;+2 for physical assistance   General bed mobility comments: Pt able to pivot to EOB with use of pad and helicopter technique but even with legs off of bed was unable to maintain upright trunk despite total assist of 2 people x 2 attempts. Pt with significant posterior lean resisting sitting despite cues and assist with son present to encourage. pt returned to supine with pivot . Pt able to roll right with mod assist and no reports of pain. With rolling left pt vocalizing pain and resisting movement  Transfers                 General  transfer comment: unable  Ambulation/Gait                Stairs            Wheelchair Mobility    Modified Rankin (Stroke Patients Only)       Balance Overall balance assessment: History of Falls;Needs assistance   Sitting balance-Leahy Scale: Zero                                       Pertinent Vitals/Pain Pain Assessment:  (PAINAD= 6)    Home Living Family/patient expects to be discharged to:: Skilled nursing facility Living Arrangements: Children Available Help at Discharge: Family;Available 24 hours/day Type of Home: House Home Access: Ramped entrance     Home Layout: One level Home Equipment: Wheelchair - manual;Cane - single point;Walker - 2 wheels;Bedside commode;Shower seat      Prior Function Level of Independence: Needs assistance   Gait / Transfers Assistance Needed: son reports pt was walking grossly 20' at a time in home furniture walking despite attempts for her to use RW  ADL's / Homemaking Assistance Needed: son states pt was bathing and dressing herself but did not elaborate        Hand Dominance        Extremity/Trunk Assessment   Upper Extremity Assessment: Generalized weakness           Lower Extremity Assessment: Generalized weakness  Cervical / Trunk Assessment: Kyphotic  Communication   Communication: HOH  Cognition Arousal/Alertness: Awake/alert Behavior During Therapy: Flat affect Overall Cognitive Status: Impaired/Different from baseline Area of Impairment: Orientation;Memory;Following commands Orientation Level: Disoriented to;Time;Situation;Place   Memory: Decreased short-term memory;Decreased recall of precautions         General Comments: pt with baseline dementia but per son with increased difficulty with following commands and responding to questions today compared to baseline    General Comments      Exercises        Assessment/Plan    PT Assessment Patient needs  continued PT services  PT Diagnosis Generalized weakness;Acute pain;Altered mental status   PT Problem List Decreased strength;Decreased range of motion;Decreased activity tolerance;Decreased balance;Decreased mobility;Decreased coordination;Pain;Decreased safety awareness;Decreased knowledge of use of DME;Decreased cognition  PT Treatment Interventions Functional mobility training;Therapeutic activities;Therapeutic exercise;Balance training;Cognitive remediation;Patient/family education   PT Goals (Current goals can be found in the Care Plan section) Acute Rehab PT Goals Patient Stated Goal: be able to walk PT Goal Formulation: With family Time For Goal Achievement: 08/04/15 Potential to Achieve Goals: Fair    Frequency Min 3X/week   Barriers to discharge Decreased caregiver support      Co-evaluation               End of Session   Activity Tolerance: Patient limited by pain Patient left: in bed;with call bell/phone within reach;with bed alarm set;with family/visitor present Nurse Communication: Precautions;Weight bearing status         Time: 1610-9604 PT Time Calculation (min) (ACUTE ONLY): 16 min   Charges:   PT Evaluation $PT Eval Moderate Complexity: 1 Procedure     PT G CodesDelorse Lek 07/21/2015, 10:46 AM Delaney Meigs, PT [phone]

## 2015-07-21 NOTE — Progress Notes (Signed)
Patient Demographics:    [name], is a 82 y.o. female, DOB - 1920/10/04, ZOX:[id number]  Admit date - 07/19/2015   Admitting Physician [name] Deutscher, MD  Outpatient Primary MD for the patient is [name] Reichert, MD  LOS - 1   Chief Complaint  Patient presents with  . Fall        Subjective:    [facility] today reports mild left hip pain, No headache, No chest pain, No abdominal pain - No Cough - No SOB. She appears pleasantly confused and unreliable historian.   Assessment  & Plan :   Mildly displaced intertrochanteric fracture of the proximal left femur due to mechanical fall s/p ORIF 07/20/2015: POD#1- The patient tolerated the procedure well, she is awake and alert at present in no acute distress. PT eval - WBAT left hip. Pain control as ordered to facilitate PT. On ASA and heparin for DVT ppx.   Mild Acute blood loss anemia due to above surgery, no evidence of active bleeding, VSS. Follow.  Chronic Kidney Disease stage III: stable at 1.45 baseline Cr appears to be 1.4-1.6. Monitor.  Hypothyroidism. Continue home Synthroid.  Mild peripheral neuropathy. Continue Neurontin.  Mild reactionary leukocytosis. Chest x-ray clear, afebrile and WBCs downtrending, check UA/culture. No dysuria.  Essential hypertension. Mild hypotension yesterday, BP stable today. PRN hydralazine IV.    Code Status : DO NOT RESUSCITATE  Family Communication  : Son at bedside  Disposition Plan  :  SNF  Consults  : Ortho  Procedures  :   CT head and C-spine. Nonacute.   L ORIF Femur By Dr Gaylord Shih 07-20-15  DVT Prophylaxis  :  Heparin   Lab Results  Component Value Date   PLT 235 07/21/2015    Inpatient Medications  Scheduled Meds: . aspirin EC  325 mg Oral Q breakfast  . feeding supplement (ENSURE  ENLIVE)  237 mL Oral Daily  . gabapentin  300 mg Oral TID  . heparin  5,000 Units Subcutaneous 3 times per day  . levothyroxine  100 mcg Oral QAC breakfast  . midodrine  2.5 mg Oral TID WC  . phenazopyridine  100 mg Oral TID WC   Continuous Infusions:   PRN Meds:.acetaminophen **OR** [DISCONTINUED] acetaminophen, bisacodyl, hydrALAZINE, HYDROcodone-acetaminophen, ipratropium-albuterol, morphine injection, [DISCONTINUED] ondansetron **OR** ondansetron (ZOFRAN) IV, senna-docusate, traMADol  Antibiotics  :   Now discontinued Anti-infectives    Start     Dose/Rate Route Frequency Ordered Stop   07/21/15 0000  clindamycin (CLEOCIN) IVPB 600 mg     600 mg 100 mL/hr over 30 Minutes Intravenous Every 6 hours 07/20/15 2137 07/21/15 0649   07/20/15 0800  cefTRIAXone (ROCEPHIN) 1 g in dextrose 5 % 50 mL IVPB  Status:  Discontinued     1 g 100 mL/hr over 30 Minutes Intravenous  Once 07/20/15 0745 07/20/15 0745        Objective:   Filed Vitals:   07/20/15 2100 07/20/15 2145 07/21/15 0147 07/21/15 0426  BP: 134/74 126/81 139/68 159/80  Pulse: 63 68 61 65  Temp: 98.6 F (37 C) 98.6 F (37 C) 98.2 F (36.8 C) 98.7 F (37.1 C)  TempSrc:   Oral Axillary  Resp: 22 20 17 17   SpO2: 100% 100% 100%  100%    Wt Readings from Last 3 Encounters:  10/07/14 68.04 kg (150 lb)  07/31/14 68.04 kg (150 lb)  07/04/14 68.04 kg (150 lb)     Intake/Output Summary (Last 24 hours) at 07/21/15 1039 Last data filed at 07/21/15 0900  Gross per 24 hour  Intake   1640 ml  Output    750 ml  Net    890 ml     Physical Exam  Awake , mildly confused, No new F.N deficits, Normal affect [place].AT, cooperative, speech clear Supple Neck,No JVD Symmetrical Chest wall movement, CTAB RRR,No Gallops,Rubs or new Murmurs, No Parasternal Heave Abd Soft, No tenderness, No organomegaly appriciated, No rebound - guarding or rigidity. No Cyanosis or Clubbing, moves all 4 extremities Dressing in place left hip -  c/d/i    Data Review:   Micro Results Recent Results (from the past 240 hour(s))  MRSA PCR Screening     Status: None   Collection Time: 07/20/15  8:57 AM  Result Value Ref Range Status   MRSA by PCR NEGATIVE NEGATIVE Final    Comment:        The GeneXpert MRSA Assay (FDA approved for NASAL specimens only), is one component of a comprehensive MRSA colonization surveillance program. It is not intended to diagnose MRSA infection nor to guide or monitor treatment for MRSA infections.     Radiology Reports Dg Chest 1 View  07/19/2015  CLINICAL DATA:  82 year old female with fall and left hip pain. EXAM: CHEST 1 VIEW COMPARISON:  Chest radiograph dated 07/03/2014 FINDINGS: The patient is rotated. The lungs are clear. No pleural effusion or pneumothorax. Stable cardiomegaly. The bones are osteopenic. No acute fracture. IMPRESSION: No acute cardiopulmonary process. Electronically Signed   By: Elgie Collard M.D.   On: 07/19/2015 23:20   Dg Knee 2 Views Left  07/19/2015  CLINICAL DATA:  Status post fall, with left hip pain. Initial encounter. EXAM: LEFT KNEE - 1-2 VIEW COMPARISON:  Left lower extremity CT performed 07/05/2003 FINDINGS: There is no evidence of fracture or dislocation. The joint spaces are preserved. Mild marginal osteophyte formation is noted at the medial and lateral compartments. The patellofemoral compartment is grossly unremarkable. No significant joint effusion is seen. The visualized soft tissues are normal in appearance. IMPRESSION: No evidence of fracture or dislocation. Electronically Signed   By: Roanna Raider M.D.   On: 07/19/2015 23:20   Dg Knee 2 Views Right  07/19/2015  CLINICAL DATA:  Status post fall, with concern for right knee injury. Initial encounter. EXAM: RIGHT KNEE - 1-2 VIEW COMPARISON:  Right knee radiographs performed earlier today at 1:36 a.m. FINDINGS: There is no evidence of fracture or dislocation. There is narrowing of the medial compartment,  with marginal osteophytes seen arising at all 3 compartments. A small knee joint effusion is suggested. The visualized soft tissues are normal in appearance. IMPRESSION: 1. No evidence of fracture or dislocation. 2. Mild tricompartmental osteoarthritis, with narrowing of the medial compartment. 3. Small knee joint effusion suggested. Electronically Signed   By: Roanna Raider M.D.   On: 07/19/2015 23:22   Ct Head Wo Contrast  07/19/2015  CLINICAL DATA:  Found in hall, lying on right side. Concern for head or cervical spine injury. Initial encounter. EXAM: CT HEAD WITHOUT CONTRAST CT CERVICAL SPINE WITHOUT CONTRAST TECHNIQUE: Multidetector CT imaging of the head and cervical spine was performed following the standard protocol without intravenous contrast. Multiplanar CT image reconstructions of the cervical spine were also generated.  COMPARISON:  CT of the head and cervical spine performed 07/03/2014 FINDINGS: CT HEAD FINDINGS There is no evidence of acute infarction, mass lesion, or intra- or extra-axial hemorrhage on CT. Prominence of the ventricles and sulci reflects moderate cortical volume loss. Cerebellar atrophy is noted. Scattered periventricular and subcortical white matter change likely reflects small vessel ischemic microangiopathy. Chronic ischemic change is noted at the basal ganglia bilaterally. The brainstem and fourth ventricle are within normal limits. The cerebral hemispheres demonstrate grossly normal gray-white differentiation. No mass effect or midline shift is seen. There is no evidence of fracture; visualized osseous structures are unremarkable in appearance. The visualized portions of the orbits are within normal limits. Mucosal thickening is noted at the right ethmoid air cells. The remaining paranasal sinuses and mastoid air cells are well-aerated. Mild soft tissue swelling is noted overlying the left frontal calvarium. CT CERVICAL SPINE FINDINGS There is no evidence of fracture or  subluxation. Vertebral bodies demonstrate normal height and alignment. Mild multilevel disc space narrowing is suggested along the cervical spine, with underlying facet disease noted. Prevertebral soft tissues are within normal limits. The thyroid gland is unremarkable in appearance. The visualized lung apices are clear. Mild calcification is noted at the carotid bifurcations bilaterally. IMPRESSION: 1. No evidence of traumatic intracranial injury or fracture. 2. No evidence of fracture or subluxation along the cervical spine. 3. Mild soft tissue swelling overlying the left frontal calvarium. 4. Moderate cortical volume loss and scattered small vessel ischemic microangiopathy. 5. Chronic ischemic change at the basal ganglia bilaterally. 6. Mild degenerative change along the cervical spine. 7. Mild calcification at the carotid bifurcations bilaterally. Carotid ultrasound would be helpful for further evaluation, when and as deemed clinically appropriate. Electronically Signed   By: Roanna Raider M.D.   On: 07/19/2015 22:46   Ct Cervical Spine Wo Contrast  07/19/2015  CLINICAL DATA:  Found in hall, lying on right side. Concern for head or cervical spine injury. Initial encounter. EXAM: CT HEAD WITHOUT CONTRAST CT CERVICAL SPINE WITHOUT CONTRAST TECHNIQUE: Multidetector CT imaging of the head and cervical spine was performed following the standard protocol without intravenous contrast. Multiplanar CT image reconstructions of the cervical spine were also generated. COMPARISON:  CT of the head and cervical spine performed 07/03/2014 FINDINGS: CT HEAD FINDINGS There is no evidence of acute infarction, mass lesion, or intra- or extra-axial hemorrhage on CT. Prominence of the ventricles and sulci reflects moderate cortical volume loss. Cerebellar atrophy is noted. Scattered periventricular and subcortical white matter change likely reflects small vessel ischemic microangiopathy. Chronic ischemic change is noted at the  basal ganglia bilaterally. The brainstem and fourth ventricle are within normal limits. The cerebral hemispheres demonstrate grossly normal gray-white differentiation. No mass effect or midline shift is seen. There is no evidence of fracture; visualized osseous structures are unremarkable in appearance. The visualized portions of the orbits are within normal limits. Mucosal thickening is noted at the right ethmoid air cells. The remaining paranasal sinuses and mastoid air cells are well-aerated. Mild soft tissue swelling is noted overlying the left frontal calvarium. CT CERVICAL SPINE FINDINGS There is no evidence of fracture or subluxation. Vertebral bodies demonstrate normal height and alignment. Mild multilevel disc space narrowing is suggested along the cervical spine, with underlying facet disease noted. Prevertebral soft tissues are within normal limits. The thyroid gland is unremarkable in appearance. The visualized lung apices are clear. Mild calcification is noted at the carotid bifurcations bilaterally. IMPRESSION: 1. No evidence of traumatic intracranial injury or fracture.  2. No evidence of fracture or subluxation along the cervical spine. 3. Mild soft tissue swelling overlying the left frontal calvarium. 4. Moderate cortical volume loss and scattered small vessel ischemic microangiopathy. 5. Chronic ischemic change at the basal ganglia bilaterally. 6. Mild degenerative change along the cervical spine. 7. Mild calcification at the carotid bifurcations bilaterally. Carotid ultrasound would be helpful for further evaluation, when and as deemed clinically appropriate. Electronically Signed   By: Roanna Raider M.D.   On: 07/19/2015 22:46   Dg C-arm 1-60 Min  07/20/2015  CLINICAL DATA:  .  ORIF of left hip. EXAM: DG C-ARM 61-120 MIN; LEFT FEMUR 2 VIEWS COMPARISON:  07/19/2015 FINDINGS: Intraoperative imaging demonstrating plate and screw fixation of previously described intertrochanteric left femur  fracture. No acute hardware complication. IMPRESSION: Internal fixation of intertrochanteric femur fracture. Electronically Signed   By: Jeronimo Greaves M.D.   On: 07/20/2015 19:34   Dg Hip Unilat With Pelvis 1v Left  07/19/2015  CLINICAL DATA:  Status post fall, with left hip pain. Initial encounter. EXAM: DG HIP (WITH OR WITHOUT PELVIS) 1V*L* COMPARISON:  None. FINDINGS: There is a mildly displaced intertrochanteric fracture of the proximal left femur. The left femoral head remains seated at the acetabulum. The patient's right femoral prosthesis is grossly unremarkable in appearance. Degenerative change is noted at the lower lumbar spine. The visualized bowel gas pattern is grossly unremarkable. IMPRESSION: Mildly displaced intertrochanteric fracture of the proximal left femur. Electronically Signed   By: Roanna Raider M.D.   On: 07/19/2015 23:19   Dg Femur Min 2 Views Left  07/20/2015  CLINICAL DATA:  .  ORIF of left hip. EXAM: DG C-ARM 61-120 MIN; LEFT FEMUR 2 VIEWS COMPARISON:  07/19/2015 FINDINGS: Intraoperative imaging demonstrating plate and screw fixation of previously described intertrochanteric left femur fracture. No acute hardware complication. IMPRESSION: Internal fixation of intertrochanteric femur fracture. Electronically Signed   By: Jeronimo Greaves M.D.   On: 07/20/2015 19:34   Dg Femur Min 2 Views Left  07/19/2015  CLINICAL DATA:  Status post fall, with left hip pain. Initial encounter. EXAM: LEFT FEMUR 2 VIEWS COMPARISON:  None. FINDINGS: There is a displaced left femoral intertrochanteric fracture. The left femoral head remains seated at the acetabulum. No additional fractures are seen. The left sacroiliac joint is unremarkable in appearance. No definite soft tissue abnormalities are characterized on radiograph. IMPRESSION: Displaced left femoral intertrochanteric fracture. Electronically Signed   By: Roanna Raider M.D.   On: 07/19/2015 23:23     CBC  Recent Labs Lab 07/19/15 2150  07/20/15 0330 07/21/15 0320  WBC 17.5* 14.1* 12.1*  HGB 13.0 12.2 10.7*  HCT 40.4 37.5 34.8*  PLT 294 277 235  MCV 91.6 91.0 90.4  MCH 29.5 29.6 27.8  MCHC 32.2 32.5 30.7  RDW 13.5 13.4 13.5  LYMPHSABS 2.5  --   --   MONOABS 0.9  --   --   EOSABS 0.3  --   --   BASOSABS 0.1  --   --     Chemistries   Recent Labs Lab 07/19/15 2150 07/20/15 0330 07/21/15 0320  NA 142 141 141  K 5.2* 5.0 4.9  CL 108 108 106  CO2 20* 22 24  GLUCOSE 131* 157* 147*  BUN 28* 27* 25*  CREATININE 1.44* 1.46* 1.45*  CALCIUM 9.0 9.0 8.8*  MG  --   --  1.6*   ------------------------------------------------------------------------------------------------------------------ No results for input(s): CHOL, HDL, LDLCALC, TRIG, CHOLHDL, LDLDIRECT in the last 72  hours.  Lab Results  Component Value Date   HGBA1C 5.6 05/20/2014   ------------------------------------------------------------------------------------------------------------------ No results for input(s): TSH, T4TOTAL, T3FREE, THYROIDAB in the last 72 hours.  Invalid input(s): FREET3 ------------------------------------------------------------------------------------------------------------------ No results for input(s): VITAMINB12, FOLATE, FERRITIN, TIBC, IRON, RETICCTPCT in the last 72 hours.  Coagulation profile  Recent Labs Lab 07/20/15 0330  INR 1.10    No results for input(s): DDIMER in the last 72 hours.  Cardiac Enzymes No results for input(s): CKMB, TROPONINI, MYOGLOBIN in the last 168 hours.  Invalid input(s): CK ------------------------------------------------------------------------------------------------------------------ No results found for: BNP  Time Spent in minutes   35   Paige Horcher PA-S on 07/21/2015 at 10:39 AM   I have directly reviewed the clinical findings, lab results and imaging studies. I have interviewed and examined the patient and agree with the documentation and management as recorded by  the Physician extender.  Leroy Sea M.D on 07/21/2015 at 11:25 AM  [facility] Office  [phone]

## 2015-07-21 NOTE — Progress Notes (Signed)
OT Cancellation Note  Patient Details Name: IOMA CHISMAR MRN: [id number] DOB: 03/22/1921   Cancelled Treatment:    Reason Eval/Treat Not Completed:  (screen) Pt is Medicare and current D/C plan is SNF. No apparent immediate acute care OT needs, therefore will defer OT to SNF. If OT eval is needed please call Acute Rehab Dept. at [phone] or text page OT at [phone].     Earlie Raveling OTR/L [phone] 07/21/2015, 3:53 PM

## 2015-07-21 NOTE — [place] FL2 (Signed)
[place] MEDICAID FL2 LEVEL OF CARE SCREENING TOOL     IDENTIFICATION  Patient Name: [name] Birthdate: 01/18/1921 Sex: female Admission Date (Current Location): 07/19/2015  [place] and [place] Number:  Aaron Edelman [id number] A [phone]) Facility and Address:  The . [facility], [address] [place], [place] [address]      Provider Number: [id number]  Attending Physician Name and Address:  Leroy Sea, MD  Relative Name and Phone Number:   Reita Cliche Milliron-)    Current Level of Care: Hospital Recommended Level of Care: Skilled Nursing Facility Prior Approval Number:    Date Approved/Denied:   PASRR Number:    Discharge Plan: SNF    Current Diagnoses: Patient Active Problem List   Diagnosis Date Noted  . Hip fracture, left (HCC) 07/20/2015  . Hyperkalemia 07/20/2015  . CKD (chronic kidney disease) stage 3, GFR 30-59 ml/min 07/20/2015  . Hip fracture requiring operative repair (HCC) 07/20/2015  . Closed nondisplaced intertrochanteric fracture of left femur (HCC)   . Femoral neck fracture (HCC) 07/04/2014  . Fracture of femoral neck (HCC)   . Hip fracture (HCC) 07/03/2014  . Right ureteral calculus 06/26/2014  . Acute renal failure (HCC) 05/20/2014  . Ureteral stone with hydronephrosis 05/20/2014  . Hyperglycemia 05/20/2014  . Renal atrophy 05/20/2014  . Leukocytosis 05/20/2014  . Urinary tract infection with pyuria 05/20/2014  . Hypertension   . UTI (urinary tract infection) 07/17/2013  . UTI (lower urinary tract infection) 11/09/2012  . Fall 11/09/2012  . Delirium superimposed on dementia 11/09/2012  . Dysphagia 06/19/2012  . Esophageal dysphagia 09/06/2011  . Odynophagia 09/06/2011  . Hoarseness 09/06/2011  . Abdominal pain, generalized 10/21/2008  . Hypothyroidism 10/10/2008  . Personal history of other diseases of digestive system 10/10/2008    Orientation RESPIRATION BLADDER Height & Weight      (disoriented x4)  Normal Indwelling catheter Weight: 150 lb (68.04 kg) (estimated) Height:   (154.9 cm)  BEHAVIORAL SYMPTOMS/MOOD NEUROLOGICAL BOWEL NUTRITION STATUS        Diet (SOFT)  AMBULATORY STATUS COMMUNICATION OF NEEDS Skin   Extensive Assist   Skin abrasions                       Personal Care Assistance Level of Assistance              Functional Limitations Info             SPECIAL CARE FACTORS FREQUENCY  PT (By licensed PT)     PT Frequency:  (3x/week)              Contractures      Additional Factors Info  Allergies, Code Status Code Status Info:  (DNR) Allergies Info:  (Lactulose, Penicillins, Prilosec)           Current Medications (07/21/2015):  This is the current hospital active medication list Current Facility-Administered Medications  Medication Dose Route Frequency Provider Last Rate Last Dose  . acetaminophen (TYLENOL) tablet 650 mg  650 mg Oral Q6H PRN Kathryne Hitch, MD      . aspirin EC tablet 325 mg  325 mg Oral Q breakfast Kathryne Hitch, MD   325 mg at 07/21/15 0820  . bisacodyl (DULCOLAX) EC tablet 5 mg  5 mg Oral Daily PRN Briscoe Deutscher, MD   5 mg at 07/21/15 0820  . feeding supplement (ENSURE ENLIVE) (ENSURE ENLIVE) liquid 237 mL  237 mL Oral Daily Prashant  Curlene Labrum, MD   237 mL at 07/21/15 1000  . gabapentin (NEURONTIN) capsule 300 mg  300 mg Oral TID Briscoe Deutscher, MD   300 mg at 07/21/15 0819  . heparin injection 5,000 Units  5,000 Units Subcutaneous 3 times per day Briscoe Deutscher, MD   5,000 Units at 07/21/15 1407  . hydrALAZINE (APRESOLINE) injection 10 mg  10 mg Intravenous Q6H PRN Leroy Sea, MD      . HYDROcodone-acetaminophen (NORCO/VICODIN) 5-325 MG per tablet 1 tablet  1 tablet Oral Q6H PRN Leroy Sea, MD   1 tablet at 07/21/15 1110  . ipratropium-albuterol (DUONEB) 0.5-2.5 (3) MG/3ML nebulizer solution 3 mL  3 mL Nebulization Q6H PRN Briscoe Deutscher, MD      . levothyroxine (SYNTHROID, LEVOTHROID)  tablet 100 mcg  100 mcg Oral QAC breakfast Briscoe Deutscher, MD   100 mcg at 07/21/15 0819  . midodrine (PROAMATINE) tablet 2.5 mg  2.5 mg Oral TID WC Leroy Sea, MD   2.5 mg at 07/21/15 1110  . morphine 2 MG/ML injection 0.5 mg  0.5 mg Intravenous Q2H PRN Kathryne Hitch, MD      . ondansetron [facility]) injection 4 mg  4 mg Intravenous Q6H PRN Kathryne Hitch, MD      . senna-docusate (Senokot-S) tablet 1 tablet  1 tablet Oral QHS PRN Briscoe Deutscher, MD      . traMADol Janean Sark) tablet 50 mg  50 mg Oral Q6H PRN Briscoe Deutscher, MD         Discharge Medications: Please see discharge summary for a list of discharge medications.  Relevant Imaging Results:  Relevant Lab Results:   Additional Information  (SSN: [id number])  Orson Gear, Student-SW [phone]

## 2015-07-21 NOTE — Clinical Social Work Placement (Signed)
   CLINICAL SOCIAL WORK PLACEMENT  NOTE  Date:  07/21/2015  Patient Details  Name: [name] MRN: [id number] Date of Birth: 1920/07/17  Clinical Social Work is seeking post-discharge placement for this patient at the Skilled  Nursing Facility level of care (*CSW will initial, date and re-position this form in  chart as items are completed):  Yes   Patient/family provided with [facility] Clinical Social Work Department's list of facilities offering this level of care within the geographic area requested by the patient (or if unable, by the patient's family).  Yes   Patient/family informed of their freedom to choose among providers that offer the needed level of care, that participate in Medicare, Medicaid or managed care program needed by the patient, have an available bed and are willing to accept the patient.  Yes   Patient/family informed of [facility]'s ownership interest in [facility] and [facility], as well as of the fact that they are under no obligation to receive care at these facilities.  PASRR submitted to EDS on       PASRR number received on       Existing PASRR number confirmed on       FL2 transmitted to all facilities in geographic area requested by pt/family on 07/21/15     FL2 transmitted to all facilities within larger geographic area on       Patient informed that his/her managed care company has contracts with or will negotiate with certain facilities, including the following:            Patient/family informed of bed offers received.  Patient chooses bed at       Physician recommends and patient chooses bed at      Patient to be transferred to   on  .  Patient to be transferred to facility by       Patient family notified on   of transfer.  Name of family member notified:        PHYSICIAN Please sign DNR, Please sign FL2     Additional Comment:    _______________________________________________ Orson Gear, Student-SW 07/21/2015,  2:04 PM

## 2015-07-22 DIAGNOSIS — D72829 Elevated white blood cell count, unspecified: Secondary | ICD-10-CM

## 2015-07-22 LAB — BASIC METABOLIC PANEL
Anion gap: 10 (ref 5–15)
BUN: 28 mg/dL — ABNORMAL HIGH (ref 6–20)
CALCIUM: 8.5 mg/dL — AB (ref 8.9–10.3)
CO2: 24 mmol/L (ref 22–32)
CREATININE: 1.64 mg/dL — AB (ref 0.44–1.00)
Chloride: 108 mmol/L (ref 101–111)
GFR calc non Af Amer: 26 mL/min — ABNORMAL LOW (ref >60)
GFR, EST AFRICAN AMERICAN: 30 mL/min — AB (ref >60)
Glucose, Bld: 120 mg/dL — ABNORMAL HIGH (ref 65–99)
Potassium: 4.5 mmol/L (ref 3.5–5.1)
SODIUM: 142 mmol/L (ref 135–145)

## 2015-07-22 LAB — CBC
HCT: 31.1 % — ABNORMAL LOW (ref 36.0–46.0)
HEMOGLOBIN: 10 g/dL — AB (ref 12.0–15.0)
MCH: 29.2 pg (ref 26.0–34.0)
MCHC: 32.2 g/dL (ref 30.0–36.0)
MCV: 90.9 fL (ref 78.0–100.0)
Platelets: 188 10*3/uL (ref 150–400)
RBC: 3.42 MIL/uL — ABNORMAL LOW (ref 3.87–5.11)
RDW: 13.7 % (ref 11.5–15.5)
WBC: 8.6 10*3/uL (ref 4.0–10.5)

## 2015-07-22 MED ORDER — FAMOTIDINE 20 MG PO TABS: 20.0000 mg | ORAL_TABLET | Freq: Two times a day (BID) | ORAL | Status: DC

## 2015-07-22 MED ORDER — FAMOTIDINE 20 MG PO TABS
20.0000 mg | ORAL_TABLET | Freq: Every day | ORAL | Status: DC
  Administered 2015-07-22 – 2015-07-23 (×2): 20 mg via ORAL
  Filled 2015-07-22 (×2): qty 1

## 2015-07-22 MED ORDER — SENNOSIDES-DOCUSATE SODIUM 8.6-50 MG PO TABS
1.0000 | ORAL_TABLET | Freq: Every evening | ORAL | Status: DC | PRN
Start: 2015-07-22 — End: 2017-08-20

## 2015-07-22 NOTE — Clinical Social Work Note (Addendum)
CSW spoke to family and presented bed offers, patient would like to go to [facility].  CSW contacted [facility] who said they can take patient tomorrow.  CSW notified physician, FL2 waiting for signature by physician.  CSW to continue to follow discharge planning.  Ervin Knack. Anav Lammert, MSW, Theresia Majors [phone] 07/22/2015 11:35 AM

## 2015-07-22 NOTE — [place] FL2 (Signed)
[place] MEDICAID FL2 LEVEL OF CARE SCREENING TOOL     IDENTIFICATION  Patient Name: [name] Birthdate: 11/22/1920 Sex: female Admission Date (Current Location): 07/19/2015  [place] and [place] Number:  Aaron Edelman [id number] A [phone]) Facility and Address:  The [place], [address] [place], [place] [address]      Provider Number: [id number]  Attending Physician Name and Address:  Leroy Sea, MD  Relative Name and Phone Number:   Reita Cliche Russomanno-)    Current Level of Care: Hospital Recommended Level of Care: Skilled Nursing Facility Prior Approval Number:    Date Approved/Denied:   PASRR Number:   [id number]  Discharge Plan: SNF    Current Diagnoses: Patient Active Problem List   Diagnosis Date Noted  . Hip fracture, left (HCC) 07/20/2015  . Hyperkalemia 07/20/2015  . CKD (chronic kidney disease) stage 3, GFR 30-59 ml/min 07/20/2015  . Hip fracture requiring operative repair (HCC) 07/20/2015  . Closed nondisplaced intertrochanteric fracture of left femur (HCC)   . Femoral neck fracture (HCC) 07/04/2014  . Fracture of femoral neck (HCC)   . Hip fracture (HCC) 07/03/2014  . Right ureteral calculus 06/26/2014  . Acute renal failure (HCC) 05/20/2014  . Ureteral stone with hydronephrosis 05/20/2014  . Hyperglycemia 05/20/2014  . Renal atrophy 05/20/2014  . Leukocytosis 05/20/2014  . Urinary tract infection with pyuria 05/20/2014  . Hypertension   . UTI (urinary tract infection) 07/17/2013  . UTI (lower urinary tract infection) 11/09/2012  . Fall 11/09/2012  . Delirium superimposed on dementia 11/09/2012  . Dysphagia 06/19/2012  . Esophageal dysphagia 09/06/2011  . Odynophagia 09/06/2011  . Hoarseness 09/06/2011  . Abdominal pain, generalized 10/21/2008  . Hypothyroidism 10/10/2008  . Personal history of other diseases of digestive system 10/10/2008    Orientation RESPIRATION BLADDER Height & Weight      (disoriented x4)  Normal Indwelling catheter Weight: 150 lb (68.04 kg) (estimated) Height:   (154.9 cm)  BEHAVIORAL SYMPTOMS/MOOD NEUROLOGICAL BOWEL NUTRITION STATUS        Diet (SOFT)  AMBULATORY STATUS COMMUNICATION OF NEEDS Skin   Extensive Assist   Skin abrasions                       Personal Care Assistance Level of Assistance              Functional Limitations Info             SPECIAL CARE FACTORS FREQUENCY  PT (By licensed PT)     PT Frequency:  (3x/week)              Contractures      Additional Factors Info  Allergies, Code Status Code Status Info:  (DNR) Allergies Info:  (Lactulose, Penicillins, Prilosec)           Current Medications (07/22/2015):  This is the current hospital active medication list Current Facility-Administered Medications  Medication Dose Route Frequency Provider Last Rate Last Dose  . acetaminophen (TYLENOL) tablet 650 mg  650 mg Oral Q6H PRN Kathryne Hitch, MD      . aspirin EC tablet 325 mg  325 mg Oral Q breakfast Kathryne Hitch, MD   325 mg at 07/22/15 0948  . bisacodyl (DULCOLAX) EC tablet 5 mg  5 mg Oral Daily PRN Briscoe Deutscher, MD   5 mg at 07/21/15 0820  . feeding supplement (ENSURE ENLIVE) (ENSURE ENLIVE) liquid 237 mL  237 mL Oral Daily  Leroy Sea, MD   237 mL at 07/21/15 1000  . gabapentin (NEURONTIN) capsule 300 mg  300 mg Oral TID Briscoe Deutscher, MD   300 mg at 07/22/15 0948  . heparin injection 5,000 Units  5,000 Units Subcutaneous 3 times per day Briscoe Deutscher, MD   5,000 Units at 07/22/15 0547  . hydrALAZINE (APRESOLINE) injection 10 mg  10 mg Intravenous Q6H PRN Leroy Sea, MD      . HYDROcodone-acetaminophen (NORCO/VICODIN) 5-325 MG per tablet 1 tablet  1 tablet Oral Q6H PRN Leroy Sea, MD   1 tablet at 07/21/15 1110  . ipratropium-albuterol (DUONEB) 0.5-2.5 (3) MG/3ML nebulizer solution 3 mL  3 mL Nebulization Q6H PRN Briscoe Deutscher, MD      . levothyroxine  (SYNTHROID, LEVOTHROID) tablet 100 mcg  100 mcg Oral QAC breakfast Briscoe Deutscher, MD   100 mcg at 07/22/15 0547  . midodrine (PROAMATINE) tablet 2.5 mg  2.5 mg Oral TID WC Leroy Sea, MD   2.5 mg at 07/22/15 0949  . morphine 2 MG/ML injection 0.5 mg  0.5 mg Intravenous Q2H PRN Kathryne Hitch, MD      . ondansetron [facility]) injection 4 mg  4 mg Intravenous Q6H PRN Kathryne Hitch, MD      . senna-docusate (Senokot-S) tablet 1 tablet  1 tablet Oral QHS PRN Briscoe Deutscher, MD      . traMADol Janean Sark) tablet 50 mg  50 mg Oral Q6H PRN Briscoe Deutscher, MD         Discharge Medications: Please see discharge summary for a list of discharge medications.  Relevant Imaging Results:  Relevant Lab Results:   Additional Information  (SSN: [id number])  Darleene Cleaver, LCSWA

## 2015-07-22 NOTE — Progress Notes (Signed)
Unable to obtain orthostatic VS. Pt unable to follow commands r/t advanced dementia. Safety/fall concerns with attempting to stand pt. VS other stable while lying (see flowsheet). Nursing will continue to monitor.

## 2015-07-22 NOTE — Discharge Summary (Addendum)
[name], is a 82 y.o. female  DOB 08/22/1920  MRN [id number].  Admission date:  07/19/2015  Admitting Physician  Briscoe Deutscher, MD  Discharge Date:  07/23/2015   Primary MD  [name], MD  Recommendations for primary care physician for things to follow:   CBC and BMP in 3-5 days.   Admission Diagnosis  Closed nondisplaced intertrochanteric fracture of left femur, initial encounter [facility]) [S72.145A]   Discharge Diagnosis  Closed nondisplaced intertrochanteric fracture of left femur, initial encounter (HCC) [S72.145A]     Principal Problem:   Hip fracture, left (HCC) Active Problems:   Delirium superimposed on dementia   Hypertension   Leukocytosis   Hyperkalemia   CKD (chronic kidney disease) stage 3, GFR 30-59 ml/min   Hip fracture requiring operative repair (HCC)   Closed nondisplaced intertrochanteric fracture of left femur [facility])      Past Medical History  Diagnosis Date  . Hypothyroidism   . Shortness of breath   . Schatzki's ring   . Hypertension   . Vertebral compression fracture (HCC)   . Arthritis     right knee  . Peripheral neuropathy (HCC)   . Hiatal hernia     Past Surgical History  Procedure Laterality Date  . Esophagogastroduodenoscopy  10/24/08    normal, status post [facility] dilator, small hiatal hernia deformity of the antrum/pylorus  . Colonoscopy  10/24/08    anal papilla otherwise normal, pan colonic diverticulum and colonic mucosa  . Appendectomy  1941  . Abdominal hysterectomy  1977  . Cholecystectomy  1967  . Spine surgery  1986  . Knee surgery  09/2008  . Hemorrhoid surgery  1999  . Colonoscopy  remote    Dr. Jerolyn Shin Smith-->polpys per patient  . Ercp  1995    Dr. Rourk--> for dilated biliary tree. no stone or neoplasm or stricture found  .  Esophagogastroduodenoscopy  1995    Dr. Rourk--> empiric dilation of esophagus with resolution of dysphagia  . Esophagogastroduodenoscopy   09/21/2011    RMR: Noncritical Schatzki's ring s/p dilated/Abnormal esophageal mucosa/Small  hiatal hermia  . Shoulder surgery    . Cystoscopy with stent placement Bilateral 05/20/2014    Procedure: CYSTOSCOPY WITH BILATERAL DOUBLE J STENT PLACEMENT;  Surgeon: Chelsea Aus, MD;  Location: [facility]S;  Service: Urology;  Laterality: Bilateral;  . Holmium laser application Bilateral 06/26/2014    Procedure: HOLMIUM LASER APPLICATION;  Surgeon: Chelsea Aus, MD;  Location: [facility]S;  Service: Urology;  Laterality: Bilateral;  . Hip arthroplasty Right 07/04/2014    Procedure: RIGHT PARTIAL HIP REPLACEMENT;  Surgeon: Vickki Hearing, MD;  Location: [facility];  Service: Orthopedics;  Laterality: Right;  . Intramedullary (im) nail intertrochanteric Left 07/20/2015    Procedure: INTRAMEDULLARY (IM) NAIL INTERTROCHANTRIC LEFT HIP;  Surgeon: Kathryne Hitch, MD;  Location: [facility] OR;  Service: Orthopedics;  Laterality: Left;       HPI  from the history and physical done on the day of admission:    [name]  [name] is a 82 y.o. female with PMH of hypertension, hypothyroidism, and right hip fracture approximately one year ago who presents to the ED after being found on the floor by family complaining of left hip pain. Patient was reportedly in her usual state of health earlier in the day but was found on the floor by her son complaining of left hip pain. She screamed out in pain with attempts to help her up and so EMS was activated. There was no apparent head trauma and her family does not believe that it could've been more than one or 2 minutes that she was on the floor.  In ED, patient was found to be afebrile, saturating well on room air, with borderline bradycardia and stable blood pressures. Noncontrast CT of the head and cervical spine were obtained and negative  for any acute abnormalities. Chest x-ray was clear and radiographs of the left pelvis and femur demonstrate a displaced intertrochanteric fracture. Basic blood work was sent and returns notable for a mild hyperkalemia, leukocytosis to 17,500, and serum creatinine of 1.44. Dr. Rayburn Ma of orthopedic surgery was consulted from the ED, reviewed the films, and has tentatively planned for surgical repair in the late afternoon of 07/20/2015. Patient will be admitted to medical-surgical unit for pain management and preoperative optimization.    Hospital Course:     Mildly displaced intertrochanteric fracture of the proximal left femur due to mechanical fall s/p ORIF 07/20/2015: POD#1- The patient tolerated the procedure well, she is awake and alert at present in no acute distress. PT eval - WBAT left hip. Aspirin 325 twice a day per GI for DVT prophylaxis, will add Zantac twice a day for GI prophylaxis as well, she is allergic to PPI. Discharge to SNF for ongoing rehabilitation.   Mild Acute blood loss anemia due to above surgery, no evidence of active bleeding, VSS. Follow.  Chronic Kidney Disease stage III: stable at 1.45 baseline Cr appears to be 1.4-1.6. Monitor.  Hypothyroidism. Continue home Synthroid.  Mild peripheral neuropathy. Continue Neurontin.  Mild reactionary leukocytosis. Chest x-ray clear, afebrile and WBCs downtrending, check UA/culture. No dysuria.  Hx of Essential hypertension. Stable no changes. Does not take any scheduled medications blood pressure stable.  Urinary retention postop. Due to lack of activity and narcotics, Flomax-Foley. Discontinue Foley in 3-7 days. If retention reoccurs after initiation of activity and reduction of narcotics than outpatient urology follow-up.    Discharge Condition: Fair  Follow UP  Follow-up Information    Follow up with Kathryne Hitch, MD. Schedule an appointment as soon as possible for a visit in 2 weeks.   Specialty:   Orthopedic Surgery   Contact information:   [address] [place] [address] [phone]       Follow up with [name], MD. Schedule an appointment as soon as possible for a visit in 1 week.   Specialty:  Family Medicine   Contact information:   [address] [place] [address] [phone]       Follow up with [facility].   Specialty:  Skilled Nursing Facility   Contact information:   [address] [place] [address] [phone]      Follow up with Kathryne Hitch, MD In 2 weeks.   Specialty:  Orthopedic Surgery   Contact information:   [address] [place] [address] [phone]       Follow up with [name], MD. Schedule an appointment as soon as  possible for a visit in 1 week.   Specialty:  Family Medicine   Contact information:   [address] [place] [address] [phone]        Consults obtained - Ortho  Diet and Activity recommendation: See Discharge Instructions below  Discharge Instructions           Discharge Instructions    Diet - low sodium heart healthy    Complete by:  As directed      Discharge instructions    Complete by:  As directed   Dry dressing as needed daily to left hip incisions. Can get incisions wet in the shower.  Weight bearing as tolerated left hip with walker.  Follow with Primary MD [name], MD in 7 days   Get CBC, CMP, 2 view Chest X ray checked  by Primary MD next visit.    Activity: As tolerated with Full fall precautions use walker/cane & assistance as needed   Disposition SNF   Diet:   Heart Healthy   with feeding assistance and aspiration precautions.  For Heart failure patients - Check your Weight same time everyday, if you gain over 2 pounds, or you develop in leg swelling, experience more shortness of breath or chest pain, call your Primary MD immediately. Follow Cardiac Low Salt Diet and 1.5 lit/day fluid  restriction.   On your next visit with your primary care physician please Get Medicines reviewed and adjusted.   Please request your Prim.MD to go over all Hospital Tests and Procedure/Radiological results at the follow up, please get all Hospital records sent to your Prim MD by signing hospital release before you go home.   If you experience worsening of your admission symptoms, develop shortness of breath, life threatening emergency, suicidal or homicidal thoughts you must seek medical attention immediately by calling 911 or calling your MD immediately  if symptoms less severe.  You Must read complete instructions/literature along with all the possible adverse reactions/side effects for all the Medicines you take and that have been prescribed to you. Take any new Medicines after you have completely understood and accpet all the possible adverse reactions/side effects.   Do not drive, operating heavy machinery, perform activities at heights, swimming or participation in water activities or provide baby sitting services if your were admitted for syncope or siezures until you have seen by Primary MD or a Neurologist and advised to do so again.  Do not drive when taking Pain medications.    Do not take more than prescribed Pain, Sleep and Anxiety Medications  Special Instructions: If you have smoked or chewed Tobacco  in the last 2 yrs please stop smoking, stop any regular Alcohol  and or any Recreational drug use.  Wear Seat belts while driving.   Please note  You were cared for by a hospitalist during your hospital stay. If you have any questions about your discharge medications or the care you received while you were in the hospital after you are discharged, you can call the unit and asked to speak with the hospitalist on call if the hospitalist that took care of you is not available. Once you are discharged, your primary care physician will handle any further medical issues. Please note  that NO REFILLS for any discharge medications will be authorized once you are discharged, as it is imperative that you return to your primary care physician (or establish a relationship with a primary care physician if you do not have one) for your  aftercare needs so that they can reassess your need for medications and monitor your lab values.     Full weight bearing    Complete by:  As directed      Increase activity slowly    Complete by:  As directed              Discharge Medications       Medication List    TAKE these medications        aspirin 325 MG EC tablet  Take 1 tablet (325 mg total) by mouth 2 (two) times daily after a meal.     famotidine 20 MG tablet  Commonly known as:  PEPCID  Take 1 tablet (20 mg total) by mouth 2 (two) times daily.     gabapentin 300 MG capsule  Commonly known as:  NEURONTIN  Take 1 capsule by mouth 3 (three) times daily.     HYDROcodone-acetaminophen 5-325 MG tablet  Commonly known as:  NORCO/VICODIN  Take 1-2 tablets by mouth every 4 (four) hours as needed for moderate pain.     ipratropium-albuterol 0.5-2.5 (3) MG/3ML Soln  Commonly known as:  DUONEB  Take 3 mLs by nebulization every 6 (six) hours as needed (Shortness of breath). Reported on 07/19/2015     levothyroxine 100 MCG tablet  Commonly known as:  SYNTHROID, LEVOTHROID  Take 100 mcg by mouth daily before breakfast.     loratadine 10 MG tablet  Commonly known as:  CLARITIN  Take 1 tablet (10 mg total) by mouth daily.     PAIN RELIEVER 325 MG tablet  Generic drug:  acetaminophen  Take 650 mg by mouth every 6 (six) hours as needed for mild pain or moderate pain.     senna-docusate 8.6-50 MG tablet  Commonly known as:  Senokot-S  Take 1 tablet by mouth at bedtime as needed for mild constipation.     tamsulosin 0.4 MG Caps capsule  Commonly known as:  FLOMAX  Take 1 capsule (0.4 mg total) by mouth daily.     traMADol 50 MG tablet  Commonly known as:  ULTRAM  Take 1  tablet (50 mg total) by mouth every 6 (six) hours as needed for moderate pain.        Major procedures and Radiology Reports - PLEASE review detailed and final reports for all details, in brief -    CT head and C-spine. Nonacute.   L ORIF Femur By Dr Gaylord Shih 07-20-15   Dg Chest 1 View  07/19/2015  CLINICAL DATA:  82 year old female with fall and left hip pain. EXAM: CHEST 1 VIEW COMPARISON:  Chest radiograph dated 07/03/2014 FINDINGS: The patient is rotated. The lungs are clear. No pleural effusion or pneumothorax. Stable cardiomegaly. The bones are osteopenic. No acute fracture. IMPRESSION: No acute cardiopulmonary process. Electronically Signed   By: Elgie Collard M.D.   On: 07/19/2015 23:20   Dg Knee 2 Views Left  07/19/2015  CLINICAL DATA:  Status post fall, with left hip pain. Initial encounter. EXAM: LEFT KNEE - 1-2 VIEW COMPARISON:  Left lower extremity CT performed 07/05/2003 FINDINGS: There is no evidence of fracture or dislocation. The joint spaces are preserved. Mild marginal osteophyte formation is noted at the medial and lateral compartments. The patellofemoral compartment is grossly unremarkable. No significant joint effusion is seen. The visualized soft tissues are normal in appearance. IMPRESSION: No evidence of fracture or dislocation. Electronically Signed   By: Roanna Raider M.D.   On: 07/19/2015 23:20  Dg Knee 2 Views Right  07/19/2015  CLINICAL DATA:  Status post fall, with concern for right knee injury. Initial encounter. EXAM: RIGHT KNEE - 1-2 VIEW COMPARISON:  Right knee radiographs performed earlier today at 1:36 a.m. FINDINGS: There is no evidence of fracture or dislocation. There is narrowing of the medial compartment, with marginal osteophytes seen arising at all 3 compartments. A small knee joint effusion is suggested. The visualized soft tissues are normal in appearance. IMPRESSION: 1. No evidence of fracture or dislocation. 2. Mild tricompartmental osteoarthritis, with  narrowing of the medial compartment. 3. Small knee joint effusion suggested. Electronically Signed   By: Roanna Raider M.D.   On: 07/19/2015 23:22   Ct Head Wo Contrast  07/19/2015  CLINICAL DATA:  Found in hall, lying on right side. Concern for head or cervical spine injury. Initial encounter. EXAM: CT HEAD WITHOUT CONTRAST CT CERVICAL SPINE WITHOUT CONTRAST TECHNIQUE: Multidetector CT imaging of the head and cervical spine was performed following the standard protocol without intravenous contrast. Multiplanar CT image reconstructions of the cervical spine were also generated. COMPARISON:  CT of the head and cervical spine performed 07/03/2014 FINDINGS: CT HEAD FINDINGS There is no evidence of acute infarction, mass lesion, or intra- or extra-axial hemorrhage on CT. Prominence of the ventricles and sulci reflects moderate cortical volume loss. Cerebellar atrophy is noted. Scattered periventricular and subcortical white matter change likely reflects small vessel ischemic microangiopathy. Chronic ischemic change is noted at the basal ganglia bilaterally. The brainstem and fourth ventricle are within normal limits. The cerebral hemispheres demonstrate grossly normal gray-white differentiation. No mass effect or midline shift is seen. There is no evidence of fracture; visualized osseous structures are unremarkable in appearance. The visualized portions of the orbits are within normal limits. Mucosal thickening is noted at the right ethmoid air cells. The remaining paranasal sinuses and mastoid air cells are well-aerated. Mild soft tissue swelling is noted overlying the left frontal calvarium. CT CERVICAL SPINE FINDINGS There is no evidence of fracture or subluxation. Vertebral bodies demonstrate normal height and alignment. Mild multilevel disc space narrowing is suggested along the cervical spine, with underlying facet disease noted. Prevertebral soft tissues are within normal limits. The thyroid gland is  unremarkable in appearance. The visualized lung apices are clear. Mild calcification is noted at the carotid bifurcations bilaterally. IMPRESSION: 1. No evidence of traumatic intracranial injury or fracture. 2. No evidence of fracture or subluxation along the cervical spine. 3. Mild soft tissue swelling overlying the left frontal calvarium. 4. Moderate cortical volume loss and scattered small vessel ischemic microangiopathy. 5. Chronic ischemic change at the basal ganglia bilaterally. 6. Mild degenerative change along the cervical spine. 7. Mild calcification at the carotid bifurcations bilaterally. Carotid ultrasound would be helpful for further evaluation, when and as deemed clinically appropriate. Electronically Signed   By: Roanna Raider M.D.   On: 07/19/2015 22:46   Ct Cervical Spine Wo Contrast  07/19/2015  CLINICAL DATA:  Found in hall, lying on right side. Concern for head or cervical spine injury. Initial encounter. EXAM: CT HEAD WITHOUT CONTRAST CT CERVICAL SPINE WITHOUT CONTRAST TECHNIQUE: Multidetector CT imaging of the head and cervical spine was performed following the standard protocol without intravenous contrast. Multiplanar CT image reconstructions of the cervical spine were also generated. COMPARISON:  CT of the head and cervical spine performed 07/03/2014 FINDINGS: CT HEAD FINDINGS There is no evidence of acute infarction, mass lesion, or intra- or extra-axial hemorrhage on CT. Prominence of the ventricles and sulci  reflects moderate cortical volume loss. Cerebellar atrophy is noted. Scattered periventricular and subcortical white matter change likely reflects small vessel ischemic microangiopathy. Chronic ischemic change is noted at the basal ganglia bilaterally. The brainstem and fourth ventricle are within normal limits. The cerebral hemispheres demonstrate grossly normal gray-white differentiation. No mass effect or midline shift is seen. There is no evidence of fracture; visualized  osseous structures are unremarkable in appearance. The visualized portions of the orbits are within normal limits. Mucosal thickening is noted at the right ethmoid air cells. The remaining paranasal sinuses and mastoid air cells are well-aerated. Mild soft tissue swelling is noted overlying the left frontal calvarium. CT CERVICAL SPINE FINDINGS There is no evidence of fracture or subluxation. Vertebral bodies demonstrate normal height and alignment. Mild multilevel disc space narrowing is suggested along the cervical spine, with underlying facet disease noted. Prevertebral soft tissues are within normal limits. The thyroid gland is unremarkable in appearance. The visualized lung apices are clear. Mild calcification is noted at the carotid bifurcations bilaterally. IMPRESSION: 1. No evidence of traumatic intracranial injury or fracture. 2. No evidence of fracture or subluxation along the cervical spine. 3. Mild soft tissue swelling overlying the left frontal calvarium. 4. Moderate cortical volume loss and scattered small vessel ischemic microangiopathy. 5. Chronic ischemic change at the basal ganglia bilaterally. 6. Mild degenerative change along the cervical spine. 7. Mild calcification at the carotid bifurcations bilaterally. Carotid ultrasound would be helpful for further evaluation, when and as deemed clinically appropriate. Electronically Signed   By: Roanna Raider M.D.   On: 07/19/2015 22:46   Dg C-arm 1-60 Min  07/20/2015  CLINICAL DATA:  .  ORIF of left hip. EXAM: DG C-ARM 61-120 MIN; LEFT FEMUR 2 VIEWS COMPARISON:  07/19/2015 FINDINGS: Intraoperative imaging demonstrating plate and screw fixation of previously described intertrochanteric left femur fracture. No acute hardware complication. IMPRESSION: Internal fixation of intertrochanteric femur fracture. Electronically Signed   By: Jeronimo Greaves M.D.   On: 07/20/2015 19:34   Dg Hip Unilat With Pelvis 1v Left  07/19/2015  CLINICAL DATA:  Status post  fall, with left hip pain. Initial encounter. EXAM: DG HIP (WITH OR WITHOUT PELVIS) 1V*L* COMPARISON:  None. FINDINGS: There is a mildly displaced intertrochanteric fracture of the proximal left femur. The left femoral head remains seated at the acetabulum. The patient's right femoral prosthesis is grossly unremarkable in appearance. Degenerative change is noted at the lower lumbar spine. The visualized bowel gas pattern is grossly unremarkable. IMPRESSION: Mildly displaced intertrochanteric fracture of the proximal left femur. Electronically Signed   By: Roanna Raider M.D.   On: 07/19/2015 23:19   Dg Femur Min 2 Views Left  07/20/2015  CLINICAL DATA:  .  ORIF of left hip. EXAM: DG C-ARM 61-120 MIN; LEFT FEMUR 2 VIEWS COMPARISON:  07/19/2015 FINDINGS: Intraoperative imaging demonstrating plate and screw fixation of previously described intertrochanteric left femur fracture. No acute hardware complication. IMPRESSION: Internal fixation of intertrochanteric femur fracture. Electronically Signed   By: Jeronimo Greaves M.D.   On: 07/20/2015 19:34   Dg Femur Min 2 Views Left  07/19/2015  CLINICAL DATA:  Status post fall, with left hip pain. Initial encounter. EXAM: LEFT FEMUR 2 VIEWS COMPARISON:  None. FINDINGS: There is a displaced left femoral intertrochanteric fracture. The left femoral head remains seated at the acetabulum. No additional fractures are seen. The left sacroiliac joint is unremarkable in appearance. No definite soft tissue abnormalities are characterized on radiograph. IMPRESSION: Displaced left femoral intertrochanteric fracture. Electronically  Signed   By: Roanna Raider M.D.   On: 07/19/2015 23:23    Micro Results      Recent Results (from the past 240 hour(s))  MRSA PCR Screening     Status: None   Collection Time: 07/20/15  8:57 AM  Result Value Ref Range Status   MRSA by PCR NEGATIVE NEGATIVE Final    Comment:        The GeneXpert MRSA Assay (FDA approved for NASAL specimens only),  is one component of a comprehensive MRSA colonization surveillance program. It is not intended to diagnose MRSA infection nor to guide or monitor treatment for MRSA infections.   Urine culture     Status: None   Collection Time: 07/21/15  2:00 PM  Result Value Ref Range Status   Specimen Description URINE, CATHETERIZED  Final   Special Requests NONE  Final   Culture 50,000 COLONIES/mL ESCHERICHIA COLI  Final   Report Status 07/23/2015 FINAL  Final   Organism ID, Bacteria ESCHERICHIA COLI  Final      Susceptibility   Escherichia coli - MIC*    AMPICILLIN <=2 SENSITIVE Sensitive     CEFAZOLIN <=4 SENSITIVE Sensitive     CEFTRIAXONE <=1 SENSITIVE Sensitive     CIPROFLOXACIN <=0.25 SENSITIVE Sensitive     GENTAMICIN <=1 SENSITIVE Sensitive     IMIPENEM <=0.25 SENSITIVE Sensitive     NITROFURANTOIN <=16 SENSITIVE Sensitive     TRIMETH/SULFA <=20 SENSITIVE Sensitive     AMPICILLIN/SULBACTAM <=2 SENSITIVE Sensitive     PIP/TAZO <=4 SENSITIVE Sensitive     * 50,000 COLONIES/mL ESCHERICHIA COLI       Today   Subjective    Gaston Islam today has no headache,no chest abdominal pain,no new weakness tingling or numbness, feels much better .   Objective   Blood pressure 126/71, pulse 59, temperature 97.9 F (36.6 C), temperature source Oral, resp. rate 18, height 5\' 1"  (1.549 m), weight 68.04 kg (150 lb), SpO2 96 %.   Intake/Output Summary (Last 24 hours) at 07/23/15 1034 Last data filed at 07/23/15 0900  Gross per 24 hour  Intake    480 ml  Output    700 ml  Net   -220 ml    Exam Awake Alert, Oriented x 2, No new F.N deficits, Normal affect [place].AT,PERRAL Supple Neck,No JVD, No cervical lymphadenopathy appriciated.  Symmetrical Chest wall movement, Good air movement bilaterally, CTAB RRR,No Gallops,Rubs or new Murmurs, No Parasternal Heave +ve B.Sounds, Abd Soft, Non tender, No organomegaly appriciated, No rebound -guarding or rigidity. No Cyanosis, Clubbing or edema, No  new Rash or bruise, L hip post op scar is stable   Data Review   CBC w Diff:  Lab Results  Component Value Date   WBC 10.1 07/23/2015   HGB 9.6* 07/23/2015   HCT 29.7* 07/23/2015   PLT 191 07/23/2015   LYMPHOPCT 14 07/19/2015   BANDSPCT 0 08/30/2013   MONOPCT 5 07/19/2015   EOSPCT 2 07/19/2015   BASOPCT 0 07/19/2015    CMP:  Lab Results  Component Value Date   NA 142 07/22/2015   K 4.5 07/22/2015   CL 108 07/22/2015   CO2 24 07/22/2015   BUN 28* 07/22/2015   CREATININE 1.64* 07/22/2015   PROT 5.4* 07/07/2014   ALBUMIN 2.5* 07/07/2014   BILITOT 0.5 07/07/2014   ALKPHOS 87 07/07/2014   AST 16 07/07/2014   ALT 11 07/07/2014  .   Total Time in preparing paper work, data evaluation and  todays exam - 35 minutes  Leroy Sea M.D on 07/23/2015 at 10:34 AM  [facility]   Office  [phone]

## 2015-07-22 NOTE — Evaluation (Signed)
Clinical/Bedside Swallow Evaluation Patient Details  Name: [name] MRN: [id number] Date of Birth: 03-17-1921  Today's Date: 07/22/2015 Time: SLP Start Time (ACUTE ONLY): 1335 SLP Stop Time (ACUTE ONLY): 1355 SLP Time Calculation (min) (ACUTE ONLY): 20 min  Past Medical History:  Past Medical History  Diagnosis Date  . Hypothyroidism   . Shortness of breath   . Schatzki's ring   . Hypertension   . Vertebral compression fracture (HCC)   . Arthritis     right knee  . Peripheral neuropathy (HCC)   . Hiatal hernia    Past Surgical History:  Past Surgical History  Procedure Laterality Date  . Esophagogastroduodenoscopy  10/24/08    normal, status post [facility] dilator, small hiatal hernia deformity of the antrum/pylorus  . Colonoscopy  10/24/08    anal papilla otherwise normal, pan colonic diverticulum and colonic mucosa  . Appendectomy  1941  . Abdominal hysterectomy  1977  . Cholecystectomy  1967  . Spine surgery  1986  . Knee surgery  09/2008  . Hemorrhoid surgery  1999  . Colonoscopy  remote    Dr. Jerolyn Shin Smith-->polpys per patient  . Ercp  1995    Dr. Rourk--> for dilated biliary tree. no stone or neoplasm or stricture found  . Esophagogastroduodenoscopy  1995    Dr. Rourk--> empiric dilation of esophagus with resolution of dysphagia  . Esophagogastroduodenoscopy   09/21/2011    RMR: Noncritical Schatzki's ring s/p dilated/Abnormal esophageal mucosa/Small  hiatal hermia  . Shoulder surgery    . Cystoscopy with stent placement Bilateral 05/20/2014    Procedure: CYSTOSCOPY WITH BILATERAL DOUBLE J STENT PLACEMENT;  Surgeon: Chelsea Aus, MD;  Location: [facility];  Service: Urology;  Laterality: Bilateral;  . Holmium laser application Bilateral 06/26/2014    Procedure: HOLMIUM LASER APPLICATION;  Surgeon: Chelsea Aus, MD;  Location: [facility];  Service: Urology;  Laterality: Bilateral;  . Hip arthroplasty Right 07/04/2014    Procedure: RIGHT PARTIAL HIP REPLACEMENT;   Surgeon: Vickki Hearing, MD;  Location: [facility];  Service: Orthopedics;  Laterality: Right;  . Intramedullary (im) nail intertrochanteric Left 07/20/2015    Procedure: INTRAMEDULLARY (IM) NAIL INTERTROCHANTRIC LEFT HIP;  Surgeon: Kathryne Hitch, MD;  Location: [facility];  Service: Orthopedics;  Laterality: Left;   HPI:  [name] is a 82 y.o. female with PMH of hypertension, hypothyroidism, and right hip fracture approximately one year ago who presents to the ED after being found on the floor by family complaining of left hip pain. Patient was reportedly in her usual state of health earlier in the day but was found on the floor by her son complaining of left hip pain. She screamed out in pain with attempts to help her up and so EMS was activated. There was no apparent head trauma and her family does not believe that it could've been more than one or 2 minutes that she was on the floor.   Assessment / Plan / Recommendation Clinical Impression  Pt presents as being confused or rather having short term memory difficulties.  Pt is hard of hearing, but able to hear directions when spoken to at an increased volume. Pt was oriented to self and place, but not date and was also confused of reason for admission. She felt it was her ankle instead of her hip being fractured. Son reported that his mom has had confusion for several years at baseline. He felt it had not increased with this hospital stay. Pt was  noticed to wheeze. Son also reported her wheeze as being constant (before hospitalization).  Pt was observed with lunch tray (oranges, bread and tea). Pt did not exhibit any sign or symptom of aspiration. Pt did not cougn or having any changes in breathing with oral intake. However due to patients confusion, wheezing and recent surgery she has an increased risk for aspiration. Recommend continueing current diet of Dys III, thin liquids and SLP monitor diet tolerance for 1 week.    Aspiration Risk  Mild  aspiration risk    Diet Recommendation Dysphagia 3 (Mech soft)   Liquid Administration via: Cup;Straw Medication Administration: Whole meds with liquid Supervision: Staff to assist with self feeding Compensations: Minimize environmental distractions;Slow rate;Small sips/bites Postural Changes: Seated upright at 90 degrees;Remain upright for at least 30 minutes after po intake    Other  Recommendations Oral Care Recommendations: Oral care BID   Follow up Recommendations    Diet tolerance, aspiration precautions   Frequency and Duration min 2x/week  1 week       Prognosis Prognosis for Safe Diet Advancement: Good Barriers to Reach Goals: Cognitive deficits      Swallow Study   General Date of Onset: 07/18/15 HPI: [name] is a 82 y.o. female with PMH of hypertension, hypothyroidism, and right hip fracture approximately one year ago who presents to the ED after being found on the floor by family complaining of left hip pain. Patient was reportedly in her usual state of health earlier in the day but was found on the floor by her son complaining of left hip pain. She screamed out in pain with attempts to help her up and so EMS was activated. There was no apparent head trauma and her family does not believe that it could've been more than one or 2 minutes that she was on the floor. Type of Study: Bedside Swallow Evaluation Diet Prior to this Study: Dysphagia 3 (soft);Thin liquids Temperature Spikes Noted: No Respiratory Status: Room air History of Recent Intubation: No Behavior/Cognition: Alert;Cooperative;Pleasant mood;Confused;Requires cueing Oral Cavity Assessment: Within Functional Limits Oral Care Completed by SLP: Yes Oral Cavity - Dentition: Edentulous Vision: Functional for self-feeding Self-Feeding Abilities: Needs assist Patient Positioning: Upright in bed Baseline Vocal Quality: Low vocal intensity Volitional Cough: Weak Volitional Swallow: Able to elicit     Oral/Motor/Sensory Function Overall Oral Motor/Sensory Function: Within functional limits   Ice Chips Ice chips: Within functional limits Presentation: Cup   Thin Liquid Thin Liquid: Within functional limits          Puree Puree: Within functional limits   Solid   GO   Solid: Within functional limits        Lindalou Hose Britten Parady, MA, CCC-SLP 07/22/2015 2:53 PM

## 2015-07-23 ENCOUNTER — Inpatient Hospital Stay
Admission: RE | Admit: 2015-07-23 | Discharge: 2015-08-13 | Disposition: A | Discharge status: Still In Hospital | Payer: Medicare Other | Source: Ambulatory Visit | Attending: Internal Medicine | Admitting: Internal Medicine

## 2015-07-23 DIAGNOSIS — R05 Cough: Principal | ICD-10-CM

## 2015-07-23 DIAGNOSIS — F05 Delirium due to known physiological condition: Secondary | ICD-10-CM

## 2015-07-23 DIAGNOSIS — S72143A Displaced intertrochanteric fracture of unspecified femur, initial encounter for closed fracture: Secondary | ICD-10-CM

## 2015-07-23 DIAGNOSIS — M25552 Pain in left hip: Secondary | ICD-10-CM

## 2015-07-23 DIAGNOSIS — R059 Cough, unspecified: Principal | ICD-10-CM

## 2015-07-23 HISTORY — DX: Displaced intertrochanteric fracture of unspecified femur, initial encounter for closed fracture: S72.143A

## 2015-07-23 LAB — CBC
HCT: 29.7 % — ABNORMAL LOW (ref 36.0–46.0)
Hemoglobin: 9.6 g/dL — ABNORMAL LOW (ref 12.0–15.0)
MCH: 29.4 pg (ref 26.0–34.0)
MCHC: 32.3 g/dL (ref 30.0–36.0)
MCV: 90.8 fL (ref 78.0–100.0)
PLATELETS: 191 10*3/uL (ref 150–400)
RBC: 3.27 MIL/uL — AB (ref 3.87–5.11)
RDW: 13.7 % (ref 11.5–15.5)
WBC: 10.1 10*3/uL (ref 4.0–10.5)

## 2015-07-23 LAB — URINE CULTURE: Culture: 50000

## 2015-07-23 MED ORDER — TAMSULOSIN HCL 0.4 MG PO CAPS: 0.4000 mg | ORAL_CAPSULE | Freq: Every day | ORAL | Status: DC

## 2015-07-23 MED ORDER — LORATADINE 10 MG PO TABS: 10.0000 mg | ORAL_TABLET | Freq: Every day | ORAL | Status: DC

## 2015-07-23 MED ORDER — LORATADINE 10 MG PO TABS
10.0000 mg | ORAL_TABLET | Freq: Every day | ORAL | Status: DC
  Administered 2015-07-23: 10 mg via ORAL
  Filled 2015-07-23: qty 1

## 2015-07-23 NOTE — Progress Notes (Signed)
Subjective: 3 Days Post-Op Procedure(s) (LRB): INTRAMEDULLARY (IM) NAIL INTERTROCHANTRIC LEFT HIP (Left) Patient awake and confused.  Objective: Vital signs in last 24 hours: Temp:  [97.6 F (36.4 C)-97.9 F (36.6 C)] 97.9 F (36.6 C) (02/09 0622) Pulse Rate:  [59-88] 59 (02/09 0622) Resp:  [18] 18 (02/09 0622) BP: (109-126)/(65-98) 126/71 mmHg (02/09 0622) SpO2:  [95 %-96 %] 96 % (02/09 0622)  Intake/Output from previous day: 02/08 0701 - 02/09 0700 In: 840 [P.O.:840] Out: 700 [Urine:700] Intake/Output this shift:     Recent Labs  07/21/15 0320 07/22/15 0635 07/23/15 0720  HGB 10.7* 10.0* 9.6*    Recent Labs  07/22/15 0635 07/23/15 0720  WBC 8.6 10.1  RBC 3.42* 3.27*  HCT 31.1* 29.7*  PLT 188 191    Recent Labs  07/21/15 0320 07/22/15 0635  NA 141 142  K 4.9 4.5  CL 106 108  CO2 24 24  BUN 25* 28*  CREATININE 1.45* 1.64*  GLUCOSE 147* 120*  CALCIUM 8.8* 8.5*   No results for input(s): LABPT, INR in the last 72 hours.  Intact pulses distally Dorsiflexion/Plantar flexion intact Incision: scant drainage Compartment soft  Assessment/Plan: 3 Days Post-Op Procedure(s) (LRB): INTRAMEDULLARY (IM) NAIL INTERTROCHANTRIC LEFT HIP (Left) Dressing changed  Patient to be discharged to SNF today Kaysey Berndt 07/23/2015, 8:45 AM

## 2015-07-23 NOTE — Clinical Social Work Placement (Signed)
   CLINICAL SOCIAL WORK PLACEMENT  NOTE  Date:  07/23/2015  Patient Details  Name: [name] MRN: [id number] Date of Birth: 1920/10/13  Clinical Social Work is seeking post-discharge placement for this patient at the Skilled  Nursing Facility level of care (*CSW will initial, date and re-position this form in  chart as items are completed):  Yes   Patient/family provided with [facility] Clinical Social Work Department's list of facilities offering this level of care within the geographic area requested by the patient (or if unable, by the patient's family).  Yes   Patient/family informed of their freedom to choose among providers that offer the needed level of care, that participate in Medicare, Medicaid or managed care program needed by the patient, have an available bed and are willing to accept the patient.  Yes   Patient/family informed of [facility]'s ownership interest in [facility] and [facility], as well as of the fact that they are under no obligation to receive care at these facilities.  PASRR submitted to EDS on       PASRR number received on  07-22-15  (Updated Windell Moulding, MSW, [place], 07-23-15)  Existing PASRR number confirmed on  07-22-15 (Updated Windell Moulding, MSW, Theresia Majors, 07-23-15)    FL2 transmitted to all facilities in geographic area requested by pt/family on 07/21/15 (Updated Windell Moulding, MSW, LCSWA, 07-23-15)     FL2 transmitted to all facilities within larger geographic area on    (Updated Windell Moulding, MSW, [place], 07-23-15)   Patient informed that his/her managed care company has contracts with or will negotiate with certain facilities, including the following:         07-22-15   Patient/family informed of bed offers received. (Updated Windell Moulding, MSW, Theresia Majors, 07-23-15)  Patient chooses bed at  [facility]  (Updated Windell Moulding, MSW, [place], 07-23-15)   Physician recommends and patient chooses bed at      Patient to be transferred to  [facility] on  07-23-15. (Updated Windell Moulding, MSW, Theresia Majors, 07-23-15)  Patient to be transferred to facility by  [facility] (Updated Windell Moulding, MSW, LCSWA, 07-23-15)   Patient family notified on  07-23-15 of transfer. (Updated Windell Moulding, MSW, [place], 07-23-15)  Name of family member notified:   [name] patient's son  (Updated Windell Moulding, MSW, LCSWA, 07-23-15)   PHYSICIAN      Additional Comment:    _______________________________________________ Darleene Cleaver, LCSWA 07/23/2015, 12:53 PM

## 2015-07-23 NOTE — Progress Notes (Signed)
Speech Language Pathology Treatment: Dysphagia  Patient Details Name: [name] MRN: [id number] DOB: 1920/06/26 Today's Date: 07/23/2015 Time: 7829-5621 SLP Time Calculation (min) (ACUTE ONLY): 11 min  Assessment / Plan / Recommendation Clinical Impression  SLP provided skilled observation with thin liquids and regular solids. Pt takes small bites independently, with need for liquid wash to facilitate oral clearance. Pt showed no overt s/s of aspiration, even with impulsive rate of intake with liquids. Recommend to continue current diet textures. She will need full supervision due to cognitive deficits.   HPI HPI: [name] is a 82 y.o. female with PMH of hypertension, hypothyroidism, and right hip fracture approximately one year ago who presents to the ED after being found on the floor by family complaining of left hip pain. Patient was reportedly in her usual state of health earlier in the day but was found on the floor by her son complaining of left hip pain. She screamed out in pain with attempts to help her up and so EMS was activated. There was no apparent head trauma and her family does not believe that it could've been more than one or 2 minutes that she was on the floor.      SLP Plan  Continue with current plan of care     Recommendations  Diet recommendations: Dysphagia 3 (mechanical soft);Thin liquid Liquids provided via: Cup;Straw Medication Administration: Whole meds with liquid Supervision: Patient able to self feed;Full supervision/cueing for compensatory strategies Compensations: Minimize environmental distractions;Slow rate;Small sips/bites Postural Changes and/or Swallow Maneuvers: Seated upright 90 degrees             Oral Care Recommendations: Oral care BID Follow up Recommendations: None Plan: Continue with current plan of care     GO               Maxcine Ham, M.A. CCC-SLP [phone]  Maxcine Ham 07/23/2015, 11:17 AM

## 2015-07-23 NOTE — Progress Notes (Signed)
Physical Therapy Treatment Patient Details Name: [name] MRN: [id number] DOB: Feb 01, 1921 Today's Date: 08/19/2015    History of Present Illness 82 yo admitted after fall at home with left hip fx s/p IM nail. PMHx: dementia, hypothyroidism, HTN, with fall with Right hip fx one year ago    PT Comments    Pt remains confused with report of pain with increased movement. Pt able to follow commands for ankle pumps and some movement on RLE but overall requires hand over hand direction and initiation of all movement. Pt to D/C to SNF today with continued difficulty with mobility and cognition with mobility deferred at this time. Pt tolerating HEP well. Will continue to follow. No family present today.   Follow Up Recommendations  SNF;Supervision/Assistance - 24 hour     Equipment Recommendations       Recommendations for Other Services       Precautions / Restrictions Precautions Precautions: Fall Restrictions Weight Bearing Restrictions: Yes LLE Weight Bearing: Weight bearing as tolerated    Mobility  Bed Mobility                  Transfers                    Ambulation/Gait                 Stairs            Wheelchair Mobility    Modified Rankin (Stroke Patients Only)       Balance                                    Cognition Arousal/Alertness: Awake/alert Behavior During Therapy: Flat affect Overall Cognitive Status: Impaired/Different from baseline   Orientation Level: Disoriented to;Time;Situation;Place   Memory: Decreased short-term memory;Decreased recall of precautions              Exercises General Exercises - Lower Extremity Ankle Circles/Pumps: AROM;Both;15 reps;Supine Short Arc Quad: PROM;Both;15 reps;Supine Heel Slides: AAROM;AROM;Right;Left;15 reps;Supine (AROM on RLE) Hip ABduction/ADduction: AAROM;Both;15 reps;Supine    General Comments        Pertinent Vitals/Pain Pain Assessment:  Faces Faces Pain Scale: No hurt    Home Living                      Prior Function            PT Goals (current goals can now be found in the care plan section) Progress towards PT goals: Not progressing toward goals - comment (limited by dementia)    Frequency       PT Plan Current plan remains appropriate    Co-evaluation             End of Session   Activity Tolerance: Patient tolerated treatment well Patient left: in bed;with call bell/phone within reach;with bed alarm set     Time: 7829-5621 PT Time Calculation (min) (ACUTE ONLY): 12 min  Charges:  $Therapeutic Exercise: 8-22 mins                    G Codes:      Delorse Lek 08/19/15, 11:38 AM Delaney Meigs, PT [phone]

## 2015-07-23 NOTE — Progress Notes (Signed)
Report was call to [name] at [facility].Pt. Got a new foley catheter (16 f)place in,she tolerated fine.600 cc urine out put.Pt. Ready to go via ambulance.[name] at the bedside.

## 2015-07-23 NOTE — Discharge Instructions (Signed)
Dry dressing as needed daily to left hip incisions. Can get incisions wet in the shower.  Weight bearing as tolerated left hip with walker.  Follow with Primary MD [name], MD in 7 days   Get CBC, CMP, 2 view Chest X ray checked  by Primary MD next visit.    Activity: As tolerated with Full fall precautions use walker/cane & assistance as needed   Disposition SNF   Diet:   Heart Healthy   with feeding assistance and aspiration precautions.  For Heart failure patients - Check your Weight same time everyday, if you gain over 2 pounds, or you develop in leg swelling, experience more shortness of breath or chest pain, call your Primary MD immediately. Follow Cardiac Low Salt Diet and 1.5 lit/day fluid restriction.   On your next visit with your primary care physician please Get Medicines reviewed and adjusted.   Please request your Prim.MD to go over all Hospital Tests and Procedure/Radiological results at the follow up, please get all Hospital records sent to your Prim MD by signing hospital release before you go home.   If you experience worsening of your admission symptoms, develop shortness of breath, life threatening emergency, suicidal or homicidal thoughts you must seek medical attention immediately by calling 911 or calling your MD immediately  if symptoms less severe.  You Must read complete instructions/literature along with all the possible adverse reactions/side effects for all the Medicines you take and that have been prescribed to you. Take any new Medicines after you have completely understood and accpet all the possible adverse reactions/side effects.   Do not drive, operating heavy machinery, perform activities at heights, swimming or participation in water activities or provide baby sitting services if your were admitted for syncope or siezures until you have seen by Primary MD or a Neurologist and advised to do so again.  Do not drive when taking Pain  medications.    Do not take more than prescribed Pain, Sleep and Anxiety Medications  Special Instructions: If you have smoked or chewed Tobacco  in the last 2 yrs please stop smoking, stop any regular Alcohol  and or any Recreational drug use.  Wear Seat belts while driving.   Please note  You were cared for by a hospitalist during your hospital stay. If you have any questions about your discharge medications or the care you received while you were in the hospital after you are discharged, you can call the unit and asked to speak with the hospitalist on call if the hospitalist that took care of you is not available. Once you are discharged, your primary care physician will handle any further medical issues. Please note that NO REFILLS for any discharge medications will be authorized once you are discharged, as it is imperative that you return to your primary care physician (or establish a relationship with a primary care physician if you do not have one) for your aftercare needs so that they can reassess your need for medications and monitor your lab values.  Weight bearing as tolerated left lower leg Keep dressing clean dry and intact left hip may shower with dressing intact.

## 2015-07-23 NOTE — Clinical Social Work Note (Signed)
Patient to be d/c'ed today to [facility].  Patient and family agreeable to plans will transport via ems RN to call report to [phone]  Windell Moulding, MSW, Theresia Majors [phone]

## 2015-07-23 NOTE — Care Management Important Message (Signed)
Important Message  Patient Details  Name: [name] MRN: [id number] Date of Birth: 02-Feb-1921   Medicare Important Message Given:  Yes    Bernadette Hoit 07/23/2015, 8:33 AM

## 2015-07-24 ENCOUNTER — Non-Acute Institutional Stay (SKILLED_NURSING_FACILITY): Payer: Medicare Other | Admitting: Internal Medicine

## 2015-07-24 ENCOUNTER — Encounter (HOSPITAL_COMMUNITY)
Admission: AD | Admit: 2015-07-24 | Discharge: 2015-07-24 | Disposition: A | Discharge status: Home / Self Care | Payer: Medicare Other | Source: Skilled Nursing Facility | Attending: Internal Medicine | Admitting: Internal Medicine

## 2015-07-24 DIAGNOSIS — S72145D Nondisplaced intertrochanteric fracture of left femur, subsequent encounter for closed fracture with routine healing: Secondary | ICD-10-CM

## 2015-07-24 DIAGNOSIS — E038 Other specified hypothyroidism: Secondary | ICD-10-CM

## 2015-07-24 DIAGNOSIS — Z9181 History of falling: Secondary | ICD-10-CM | POA: Insufficient documentation

## 2015-07-24 DIAGNOSIS — N183 Chronic kidney disease, stage 3 unspecified: Secondary | ICD-10-CM

## 2015-07-24 DIAGNOSIS — F05 Delirium due to known physiological condition: Secondary | ICD-10-CM | POA: Insufficient documentation

## 2015-07-24 DIAGNOSIS — D62 Acute posthemorrhagic anemia: Secondary | ICD-10-CM | POA: Diagnosis not present

## 2015-07-24 DIAGNOSIS — G9009 Other idiopathic peripheral autonomic neuropathy: Secondary | ICD-10-CM | POA: Insufficient documentation

## 2015-07-24 DIAGNOSIS — E039 Hypothyroidism, unspecified: Secondary | ICD-10-CM | POA: Insufficient documentation

## 2015-07-24 DIAGNOSIS — I10 Essential (primary) hypertension: Secondary | ICD-10-CM | POA: Insufficient documentation

## 2015-07-24 DIAGNOSIS — Z4789 Encounter for other orthopedic aftercare: Secondary | ICD-10-CM | POA: Insufficient documentation

## 2015-07-24 LAB — BASIC METABOLIC PANEL
Anion gap: 6 (ref 5–15)
BUN: 37 mg/dL — AB (ref 6–20)
CALCIUM: 8.2 mg/dL — AB (ref 8.9–10.3)
CHLORIDE: 107 mmol/L (ref 101–111)
CO2: 26 mmol/L (ref 22–32)
CREATININE: 1.77 mg/dL — AB (ref 0.44–1.00)
GFR, EST AFRICAN AMERICAN: 28 mL/min — AB (ref >60)
GFR, EST NON AFRICAN AMERICAN: 24 mL/min — AB (ref >60)
Glucose, Bld: 97 mg/dL (ref 65–99)
Potassium: 4.8 mmol/L (ref 3.5–5.1)
SODIUM: 139 mmol/L (ref 135–145)

## 2015-07-24 LAB — CBC
HCT: 29 % — ABNORMAL LOW (ref 36.0–46.0)
Hemoglobin: 9.3 g/dL — ABNORMAL LOW (ref 12.0–15.0)
MCH: 29.5 pg (ref 26.0–34.0)
MCHC: 32.1 g/dL (ref 30.0–36.0)
MCV: 92.1 fL (ref 78.0–100.0)
PLATELETS: 209 10*3/uL (ref 150–400)
RBC: 3.15 MIL/uL — AB (ref 3.87–5.11)
RDW: 13.7 % (ref 11.5–15.5)
WBC: 7.8 10*3/uL (ref 4.0–10.5)

## 2015-07-24 NOTE — Progress Notes (Signed)
Patient ID: SHASTA CHINN, female   DOB: [birth year], 82 y.o.   MRN: [id number]   This is an acute visit.  Level care skilled.  Facility [facility].  Chief complaint acute visit status post hospitalization for a closed nondisplaced fracture of the left femur--status post repair.  History of present illness  Patient is a pleasant 82 year old female with a history of a right hip fracture approximately one year ago who presented to the ED after found on the floor by family member complaining of left hip pain.  She was brought to the ER by EMS.  In the ED she was found to be afebrile noncontrast CT of the head and cervical spine were negative for any acute abnormalities-hip x-ray did show a displaced intertrochanteric  Fracture  She also had mild hyperkalemia with leukocytosis of 17,500 and a creatinine of 1.44.  She subsequently underwent an ORIF of the femur fracture and tolerated the procedure well-she will need continued physical therapy she is weightbearing as tolerated on the left hip area  Aspirin 325 mg twice a day as been prescribed for DVT prophylaxis.  She is also on Zantac twice a day for GI prophylaxis she is allergic to PPI.  She did have a mild acute blood loss anemia due to surgery there was no evidence of active bleeding hemoglobin appears stable at 9.3 today.  She also has a history of chronic kidney disease stage III this was stable at discharge at 1.45.  In regards to the leukocytosis this was thought to be reactionary her chest x-ray was clear white count was trending down and is 7.8 actually on lab done today urine culture was negative.  Postop patient did have urinary retention this is thought secondary because of lack of activity narcotics she is on a Foley catheter with recommendation to discontinue this in several days and reinitiated if retention reoccurs.  Currently she has no complaints she is resting in bed comfortably family is in the room.  Previous  medical history.  History of left hip fracture status post for pain.  Delirium superimposed on dementia.  Hypertension.  Leukocytosis thought reactionary.  Hyperkalemia.  Chronic kidney disease stage III.  Hypothyroidism.  Schataki's ring very  Hypertension.  Vertebral compression fracture.  Right knee are still arthritis.  Peripheral neuropathy.  Hiatal hernia.  Previous surgical history.  Appendectomy.  Abdominal hysterectomy.  Cholecystectomy.  Spine surgery.  Hemorrhoid surgery.  Knee surgery.  Previous history of right hip repair up roughly one year ago January 2016.  Medications.  Aspirin 325 mg twice a day.  Pepcid 20 mg twice a day.  Neurontin 300 mg 3 times a day.  Norco 5-3 25 mg one tablet every 4 hours when necessary.  Duo nebs every 6 hours when necessary.  Synthroid 100 g daily.  Claritin 10 mg daily.  Senokot-S 1 tab daily at bedtime when necessary.  Flomax 0.4 mg daily.  In tramadol 50 mg every 6 hours when necessary pain  Social history patient lives with her brother-the palate she has lived with him for some time he is very supportive and in the room with her today-no history of tobacco use alcohol use or illicit drug use.  Family history significant for MI in her father.  Review of systems.  General does not complain of any fever or chills.  Skin does not complain of rashes or itching surgical site is currently covered.  Eyes does not clear visual changes.  Ear nose mouth and throat does  not complaining of sore throat or nasal discharge.  Cardiac not complaining of chest pain does have some mild left leg edema status post surgery.  GI does not complain of nausea vomiting diarrhea constipation or abdominal discomfort this time.  GU is not complaining of dysuria.  Muscle skeletal at this point hip pain appears to be controlled on current meds.  Neurologic is not complaining of dizziness headache or  numbness.  In psych she is not complaining of any depression or anxiety appears to be in good spirits.  Physical exam.  Temperature is 98.1 pulse 75 respirations 20 blood pressure 153/85.  General this is a pleasant elderly female in no distress.  Her skin is warm and dry surgical site is covered with a left hip.  Eyes pupils appear reactive light sclera and duct were clear visual acuity appears grossly intact.  Oropharynx clear mucous membranes moist.  Chest is clear to auscultation there is no labored breathing.  Heart regular rate and rhythm without murmur gallop or rub she has some mild left lower extremity edema pedal pulse is intact this appears to be typical postop edema.  Abdomen soft nontender positive bowel sounds.  GU she has a Foley catheter in place.  Musculoskeletal there is covering over her left hip site otherwise moves her other 3 extremities appears at baseline I do not note any deformities strength appears to be intact.  Neurologic is grossly intact her speech is clear no lateralizing findings.  Psych she is oriented to self she does follow simple verbal commands with some prompting.  Labs.  07/24/2015.  Sodium 139 potassium 4.8 BUN 37 creatinine 1.77.  WBC 7.8 hemoglobin 9.3 platelets 209.  Gen. 25th 2017.  Albumin was 2.5 otherwise liver function tests within normal limits.  Urine culture grew 50,000 colonies of Escherichia coli.  Assessment plan.  1 history of left hip fracture status post repair she appears to be stable in this regard she is receiving tramadol as needed for pain as well as Norco for more severe pain-she is on aspirin 325 twice a day for prophylaxis she will need continued PT and OT she is under weightbearing as tolerated.  #2 history urinary retention she currently has a catheter in place -recommendation to discontinue this within the next few days and do a trial to see if she can void freely if not we will have to readdress.-She  has been started on Flomax as well.    #3 leukocytosis this appears to have resolved is afebrile she does not have chest congestion cough or complain of dysuria.  #4 history of hyperkalemia on admission hospital this is resolved with a potassium 4.8 as of today Will update metabolic panel early next week.  #5 history of chronic renal insufficiency stage III-creatinine 1.77 is slightly above her baseline fluids will have to be encouraged update this early next week.  #6 history of anemia hemoglobin 9.3 suspect this is blood loss anemia will add iron daily and update CBC next week will have to monitor for constipation she is on Senokot daily when necessary  #7 history of hypothyroidism continues on Synthroid--at some point will need an updated TSH  #8 history of GERD-type symptoms GI prophylaxis-she is on Pepcid twice a day  #9 peripheral neuropathy continues on Neurontin 300 mg 3 times a day this appears to be controlled presently   [id number]-of note greater than 40 minutes spent assessing patient-reviewing her chart-and coordinating and formulating a plan of care for numerous diagnoses-of note greater  than 50% of time spent coordinating plan of care for her numerous diagnoses

## 2015-07-26 ENCOUNTER — Non-Acute Institutional Stay (SKILLED_NURSING_FACILITY): Payer: Medicare Other | Admitting: Internal Medicine

## 2015-07-26 ENCOUNTER — Encounter: Payer: Self-pay | Admitting: Internal Medicine

## 2015-07-26 DIAGNOSIS — S72145D Nondisplaced intertrochanteric fracture of left femur, subsequent encounter for closed fracture with routine healing: Secondary | ICD-10-CM | POA: Diagnosis not present

## 2015-07-26 DIAGNOSIS — N183 Chronic kidney disease, stage 3 unspecified: Secondary | ICD-10-CM

## 2015-07-26 DIAGNOSIS — R410 Disorientation, unspecified: Secondary | ICD-10-CM

## 2015-07-26 DIAGNOSIS — R339 Retention of urine, unspecified: Secondary | ICD-10-CM

## 2015-07-27 ENCOUNTER — Encounter (HOSPITAL_COMMUNITY)
Admission: RE | Admit: 2015-07-27 | Discharge: 2015-07-27 | Disposition: A | Discharge status: Home / Self Care | Payer: Medicare Other | Source: Skilled Nursing Facility | Attending: Internal Medicine | Admitting: Internal Medicine

## 2015-07-27 ENCOUNTER — Ambulatory Visit (HOSPITAL_COMMUNITY)
Admission: RE | Admit: 2015-07-27 | Discharge: 2015-07-27 | Disposition: A | Discharge status: Home / Self Care | Payer: No Typology Code available for payment source | Source: Ambulatory Visit | Attending: Internal Medicine | Admitting: Internal Medicine

## 2015-07-27 DIAGNOSIS — R918 Other nonspecific abnormal finding of lung field: Secondary | ICD-10-CM | POA: Insufficient documentation

## 2015-07-27 DIAGNOSIS — I517 Cardiomegaly: Secondary | ICD-10-CM | POA: Insufficient documentation

## 2015-07-27 DIAGNOSIS — R05 Cough: Secondary | ICD-10-CM | POA: Insufficient documentation

## 2015-07-27 DIAGNOSIS — M40209 Unspecified kyphosis, site unspecified: Secondary | ICD-10-CM | POA: Diagnosis not present

## 2015-07-27 LAB — CBC WITH DIFFERENTIAL/PLATELET
BASOS PCT: 1 %
Basophils Absolute: 0 10*3/uL (ref 0.0–0.1)
EOS ABS: 0.4 10*3/uL (ref 0.0–0.7)
Eosinophils Relative: 6 %
HCT: 29.6 % — ABNORMAL LOW (ref 36.0–46.0)
HEMOGLOBIN: 9.4 g/dL — AB (ref 12.0–15.0)
Lymphocytes Relative: 33 %
Lymphs Abs: 2.5 10*3/uL (ref 0.7–4.0)
MCH: 29.5 pg (ref 26.0–34.0)
MCHC: 31.8 g/dL (ref 30.0–36.0)
MCV: 92.8 fL (ref 78.0–100.0)
MONO ABS: 0.8 10*3/uL (ref 0.1–1.0)
MONOS PCT: 11 %
NEUTROS PCT: 49 %
Neutro Abs: 3.8 10*3/uL (ref 1.7–7.7)
Platelets: 300 10*3/uL (ref 150–400)
RBC: 3.19 MIL/uL — ABNORMAL LOW (ref 3.87–5.11)
RDW: 13.6 % (ref 11.5–15.5)
WBC: 7.6 10*3/uL (ref 4.0–10.5)

## 2015-07-27 LAB — BASIC METABOLIC PANEL
Anion gap: 3 — ABNORMAL LOW (ref 5–15)
Anion gap: 7 (ref 5–15)
BUN: 39 mg/dL — ABNORMAL HIGH (ref 6–20)
BUN: 39 mg/dL — ABNORMAL HIGH (ref 6–20)
CALCIUM: 8.2 mg/dL — AB (ref 8.9–10.3)
CHLORIDE: 108 mmol/L (ref 101–111)
CO2: 26 mmol/L (ref 22–32)
CO2: 27 mmol/L (ref 22–32)
CREATININE: 1.48 mg/dL — AB (ref 0.44–1.00)
CREATININE: 1.56 mg/dL — AB (ref 0.44–1.00)
Calcium: 9 mg/dL (ref 8.9–10.3)
Chloride: 112 mmol/L — ABNORMAL HIGH (ref 101–111)
GFR calc non Af Amer: 30 mL/min — ABNORMAL LOW (ref >60)
GFR calc non Af Amer: 28 mL/min — ABNORMAL LOW (ref >60)
GFR, EST AFRICAN AMERICAN: 34 mL/min — AB (ref >60)
GFR, EST AFRICAN AMERICAN: 32 mL/min — AB (ref >60)
Glucose, Bld: 98 mg/dL (ref 65–99)
Glucose, Bld: 120 mg/dL — ABNORMAL HIGH (ref 65–99)
POTASSIUM: 5.6 mmol/L — AB (ref 3.5–5.1)
Potassium: 5.3 mmol/L — ABNORMAL HIGH (ref 3.5–5.1)
SODIUM: 141 mmol/L (ref 135–145)
SODIUM: 142 mmol/L (ref 135–145)

## 2015-07-27 LAB — TSH: TSH: 9.799 u[IU]/mL — AB (ref 0.350–4.500)

## 2015-07-28 ENCOUNTER — Non-Acute Institutional Stay (SKILLED_NURSING_FACILITY): Payer: Medicare Other | Admitting: Internal Medicine

## 2015-07-28 DIAGNOSIS — N183 Chronic kidney disease, stage 3 unspecified: Secondary | ICD-10-CM

## 2015-07-28 DIAGNOSIS — E875 Hyperkalemia: Secondary | ICD-10-CM | POA: Diagnosis not present

## 2015-07-28 DIAGNOSIS — E038 Other specified hypothyroidism: Secondary | ICD-10-CM | POA: Diagnosis not present

## 2015-07-28 NOTE — Progress Notes (Signed)
Patient ID: [name], female   DOB: September 29, 1920, 82 y.o.   MRN: [id number]     This is an acute visit.  Level care skilled.  Facility [facility].   Acute visit follow-up hyperkalemia with history of renal insufficiency-also elevated TSH  History of present illness  Patient is a pleasant 82 year old female with a history of a right hip fracture approximately one year ago who presented to the ED after found on the floor by family member complaining of left hip pain.  She was brought to the ER by EMS.  I ip x-ray did show a displaced intertrochanteric  Fracture  She also had mild hyperkalemia with leukocytosis of 17,500 and a creatinine of 1.44.  She subsequently underwent an ORIF of the femur fracture and tolerated the procedure well  hemoglobin appears stable at 9.3 today.  She also has a history of chronic kidney disease stage III this was stable at discharge at 1.45 updated creatinine is 1.56 this was done on February 13.  In regards to the leukocytosis this was thought to be reactionary her chest x-ray was clear white count was trending down and is 7.8 actually on lab done in this facility  Lab done yesterday shows she has some mild hyperkalemia at 5.6-she did receive a dose of Kayexalate yesterday and we have ordered an updated lab for tomorrow.  Lab yesterday also showed a TSH of 9.799 I did do a chart review and appears this is comparable to what her TSH was a year ago.  She is on Synthroid 100 g daily apparently she had been on this dose for some time from what I can tell per chart review although this is been somewhat of a difficult to fully assess   Really patient has no complaints she is resting in bed comfortably she is a poor historian secondary to dementia.      Previous medical history.  History of left hip fracture status post for pain.  Delirium superimposed on dementia.  Hypertension.  Leukocytosis thought reactionary.  Hyperkalemia.  Chronic  kidney disease stage III.  Hypothyroidism.  Schataki's ring very  Hypertension.  Vertebral compression fracture.  Right knee are still arthritis.  Peripheral neuropathy.  Hiatal hernia.  Previous surgical history.  Appendectomy.  Abdominal hysterectomy.  Cholecystectomy.  Spine surgery.  Hemorrhoid surgery.  Knee surgery.  Previous history of right hip repair up roughly one year ago January 2016.  Medications.  Aspirin 325 mg twice a day.  Pepcid 20 mg twice a day.  Neurontin 300 mg 3 times a day.  Norco 5-3 25 mg one tablet every 4 hours when necessary.  Duo nebs every 6 hours when necessary.  Synthroid 100 g daily.  Claritin 10 mg daily.  Senokot-S 1 tab daily at bedtime when necessary.  Flomax 0.4 mg daily.  In tramadol 50 mg every 6 hours when necessary pain  Social history patient lives with her brother-the palate she has lived with him for some time he is very supportive and in the room with her today-no history of tobacco use alcohol use or illicit drug use.  Family history significant for MI in her father.  Review of systems.  General does not complain of any fever or chills.  Skin does not complain of rashes or itching surgical site is currently covered.  Eyes does not clear visual changes.  Ear nose mouth and throat does not complaining of sore throat or nasal discharge.  Cardiac not complaining of chest pain does  have some mild left leg edema status post surgery.  GI does not complain of nausea vomiting diarrhea constipation or abdominal discomfort this time.  GU is not complaining of dysuria.  Muscle skeletal at this point hip pain appears to be controlled on current meds.  Neurologic is not complaining of dizziness headache or numbness.  In psych she is not complaining of any depression or anxiety appears to be in good spirits.-She has been sleeping so she is somewhat lethargic but not concerning so  Physical  exam.  Temperature 97.1 pulse 62 respirations 20 blood pressure 132/43.  General this is a pleasant elderly female in no distress. Comfortably in bed  Her skin is warm and dry surgical site is covered with a left hip.  Eyes pupils appear reactive light sclera and duct were clear visual acuity appears grossly intact.  Oropharynx clear mucous membranes moist.  Chest is clear to auscultation there is no labored breathing.  Heart regular rate and rhythm without murmur gallop or rub she has some mild left lower extremity edema  this appears to be typical postop edema.     Musculoskeletal there is covering over her left hip site otherwise moves her other 3 extremities appears at baseline I do not note any deformities strength appears to be intact.  Neurologic is grossly intact her speech is clear no lateralizing findings.  Psych she is oriented to self .  Labs.  07/26/2014.  TSH-9.799.  Sodium 142 potassium 5.6 BUN 33 creatinine 1.56 CO2 level was 27  07/24/2015.  Sodium 139 potassium 4.8 BUN 37 creatinine 1.77.  WBC 7.8 hemoglobin 9.3 platelets 209.  Gen. 25th 2017.  Albumin was 2.5 otherwise liver function tests within normal limits.  Urine culture grew 50,000 colonies of Escherichia coli.  Assessment plan.  1 history of hyperkalemia she does have some history of mild hyperkalemia per chart review with a history of renal insufficiency-potassium of 5.6 yesterday has been addressed with a dose of Kayexalate-update metabolic panel is pending for tomorrow-clinically she appears to be stable.  In regards to renal insufficiency creatinine 1.56 appears to be relatively her baseline he had been as high as 1.77 earlier-at this point will await updated metabolic panel.  #2-history of hypothyroidism TSH has been it appears elevated for a while in the 9 range-will increase her Synthroid 225 g a day this will need to be rechecked here in about 4 weeks.   3 history of left hip  fracture status post repair she appears to be stable in this regard she is receiving tramadol as needed for pain as well as Norco for more severe pain-she is on aspirin 325 twice a day for prophylaxis she will need continued PT and OT she is under weightbearing as tolerated.--She has been working with therapy and appears to be comfortable this evening    [id number]-of note greater than 25 minutes spent assessing patient-reviewing her labs and her chart especially in regards to investigating her history of hypothyroidism.  Of note greater than 50% of time spent coordinating plan of care with chart review

## 2015-07-29 ENCOUNTER — Encounter (HOSPITAL_COMMUNITY)
Admission: AD | Admit: 2015-07-29 | Discharge: 2015-07-29 | Disposition: A | Discharge status: Home / Self Care | Payer: Medicare Other | Source: Skilled Nursing Facility | Attending: Internal Medicine | Admitting: Internal Medicine

## 2015-07-29 LAB — BASIC METABOLIC PANEL
ANION GAP: 7 (ref 5–15)
BUN: 43 mg/dL — ABNORMAL HIGH (ref 6–20)
CHLORIDE: 109 mmol/L (ref 101–111)
CO2: 27 mmol/L (ref 22–32)
CREATININE: 1.53 mg/dL — AB (ref 0.44–1.00)
Calcium: 8.8 mg/dL — ABNORMAL LOW (ref 8.9–10.3)
GFR calc non Af Amer: 28 mL/min — ABNORMAL LOW (ref >60)
GFR, EST AFRICAN AMERICAN: 33 mL/min — AB (ref >60)
Glucose, Bld: 103 mg/dL — ABNORMAL HIGH (ref 65–99)
POTASSIUM: 4.8 mmol/L (ref 3.5–5.1)
Sodium: 143 mmol/L (ref 135–145)

## 2015-07-30 NOTE — Progress Notes (Addendum)
Patient ID: [name], female   DOB: 01/07/1921, 82 y.o.   MRN: [id number]                HISTORY & PHYSICAL  DATE:  07/26/2015        FACILITY: [facility]                   LEVEL OF CARE:   SNF   CHIEF COMPLAINT:  Admission to SNF, post stay at [facility], 07/19/2015 through 07/23/2015.    HISTORY OF PRESENT ILLNESS:  This is a 82 year-old woman who lives at home in [place] with her son.  Per her son's description, she has an unsteady gait, groping onto furniture.  She will not use a cane or a walker and has had three falls since Thanksgiving.    On this occasion, she fell and suffered an intertrochanteric fracture of the left hip.  She underwent ORIF.    She seems to have had postoperative delirium and also urinary retention.  Her son thinks she had spinal anesthesia which perhaps accounted for some of the urinary retention.  He states she "filled up the bag".    She was placed on aspirin 325 b.i.d. for DVT prophylaxis, and Zantac twice a day for GI prophylaxis while she is on aspirin.    Besides the ataxic gait, her son states that she is normally able to take care of her own ADLs.  She is independent with showering and toileting.  She has a bedside commode.    In terms of, I think, the delirium work-up, she had a urine culture on 07/21/2015 that showed 50,000 E.coli.  I am not completely certain whether this was actually treated.    PAST MEDICAL HISTORY/PROBLEM LIST:    Past Medical History  Diagnosis Date  . Hypothyroidism   . Shortness of breath   . Schatzki's ring   . Hypertension   . Vertebral compression fracture (HCC)   . Arthritis     right knee  . Peripheral neuropathy (HCC)   . Hiatal hernia       PAST SURGICAL HISTORY:   Past Surgical History  Procedure Laterality Date  . Esophagogastroduodenoscopy  10/24/08    normal, status post [facility] dilator, small hiatal hernia deformity of the antrum/pylorus  . Colonoscopy  10/24/08    anal papilla  otherwise normal, pan colonic diverticulum and colonic mucosa  . Appendectomy  1941  . Abdominal hysterectomy  1977  . Cholecystectomy  1967  . Spine surgery  1986  . Knee surgery  09/2008  . Hemorrhoid surgery  1999  . Colonoscopy  remote    Dr. Jerolyn Shin Smith-->polpys per patient  . Ercp  1995    Dr. Rourk--> for dilated biliary tree. no stone or neoplasm or stricture found  . Esophagogastroduodenoscopy  1995    Dr. Rourk--> empiric dilation of esophagus with resolution of dysphagia  . Esophagogastroduodenoscopy   09/21/2011    RMR: Noncritical Schatzki's ring s/p dilated/Abnormal esophageal mucosa/Small  hiatal hermia  . Shoulder surgery    . Cystoscopy with stent placement Bilateral 05/20/2014    Procedure: CYSTOSCOPY WITH BILATERAL DOUBLE J STENT PLACEMENT;  Surgeon: Chelsea Aus, MD;  Location: [facility];  Service: Urology;  Laterality: Bilateral;  . Holmium laser application Bilateral 06/26/2014    Procedure: HOLMIUM LASER APPLICATION;  Surgeon: Chelsea Aus, MD;  Location: [facility];  Service: Urology;  Laterality: Bilateral;  . Hip arthroplasty Right 07/04/2014  Procedure: RIGHT PARTIAL HIP REPLACEMENT;  Surgeon: Vickki Hearing, MD;  Location: [facility];  Service: Orthopedics;  Laterality: Right;  . Intramedullary (im) nail intertrochanteric Left 07/20/2015    Procedure: INTRAMEDULLARY (IM) NAIL INTERTROCHANTRIC LEFT HIP;  Surgeon: Kathryne Hitch, MD;  Location: [facility] OR;  Service: Orthopedics;  Laterality: Left;  .      CURRENT MEDICATIONS:  Medication list is reviewed.          Aspirin 325 b.i.d.      Pepcid 20 b.i.d.      Gabapentin 300 three times a day.    Norco 5/325, 1-2 q.4 hours p.r.n.      DuoNebs q.6 hours p.r.n.     Synthroid   100 mcg daily.    Claritin 10 q.d.      Senokot S, 1 tablet for mild constipation.    Tramadol 50 mg q.6 hours p.r.n.     LABORATORY DATA/RADIOLOGY:  Her electrolytes on 07/24/2015 showed a sodium of 139, potassium 4.8,  BUN 37, creatinine 1.77.  It would appear that she has some degree of chronic renal failure with a baseline creatinine in this range.    Her last white count was 7.8, hemoglobin 9.3, platelet count normal.    A CT scan of the head done on 07/19/2015 showed no acute intracranial process.    SOCIAL HISTORY:                   HOUSING:  As noted, the patient lives in [place] with her son.   FUNCTIONAL STATUS:  She has both a cane and a walker, but does not use them.  Very unsteady gait.  As far as her son is aware, there are no voiding difficulties.  He describes her as being independent with ADLs.    FAMILY HISTORY:    Family History  Problem Relation Age of Onset  . Heart attack Father 33    deceased       PHYSICAL EXAMINATION:   GENERAL APPEARANCE:  The patient is not in any distress.  She is apparently eating well.    HEENT:   MOUTH/THROAT:  Mucous membranes are dry.     CHEST/RESPIRATORY:  Clear air entry bilaterally as far as I can tell, although the patient had difficulty rolling over.   CARDIOVASCULAR:   CARDIAC:  Heart sounds are normal.  She appears to be euvolemic.       GASTROINTESTINAL:   ABDOMEN:  Somewhat distended.  Bowel sounds are positive.  No masses.     LIVER/SPLEEN/KIDNEY:   No liver, no spleen.   GENITOURINARY:   BLADDER:  No suprapubic fullness is noted.  Foley catheter is in.  There is no CVA tenderness.    MUSCULOSKELETAL:    EXTREMITIES:   LEFT LOWER EXTREMITY:  Her surgical incision on the left hip looks very stable.  Sutures still in.  Mild effusion in the left knee.    VASCULAR:  No evidence of a DVT.  Peripheral pulses are palpable.    PSYCHIATRIC:   MENTAL STATUS:  She seems to be fairly stable.  She was able to answer questions about where she was living, where she was from.  Her son states that over the last year, she has lost the ability to crochet and do crosswords.  The possibility of underlying Alzheimer's disease based on that history would seem  quite likely.  There is no current evidence of delirium.    ASSESSMENT/PLAN:  Status post left hip fracture and ORIF.  She appears to be doing well.    On aspirin for DVT prophylaxis, and on Pepcid for gastric cytoprotection from aspirin.    Postoperative delirium, likely on the background of mild dementia.    Urinary retention.  According to the son, she had spinal anesthesia.  This may be the issue here.  I will go ahead and attempt to pull the Foley catheter tomorrow per our usual protocol.    Stage III chronic renal failure.  We have already repeated her lab work.  Last potassium was 4.8.  BUN and creatinine were 37 and 1.77, respectively, which seems to be within her baseline.

## 2015-07-31 ENCOUNTER — Encounter (HOSPITAL_COMMUNITY)
Admission: RE | Admit: 2015-07-31 | Discharge: 2015-07-31 | Disposition: A | Discharge status: Home / Self Care | Payer: Medicare Other | Source: Skilled Nursing Facility | Attending: Internal Medicine | Admitting: Internal Medicine

## 2015-07-31 LAB — BASIC METABOLIC PANEL
Anion gap: 8 (ref 5–15)
BUN: 50 mg/dL — AB (ref 6–20)
CHLORIDE: 111 mmol/L (ref 101–111)
CO2: 24 mmol/L (ref 22–32)
CREATININE: 1.53 mg/dL — AB (ref 0.44–1.00)
Calcium: 8.7 mg/dL — ABNORMAL LOW (ref 8.9–10.3)
GFR calc Af Amer: 33 mL/min — ABNORMAL LOW (ref >60)
GFR calc non Af Amer: 28 mL/min — ABNORMAL LOW (ref >60)
Glucose, Bld: 111 mg/dL — ABNORMAL HIGH (ref 65–99)
POTASSIUM: 4.9 mmol/L (ref 3.5–5.1)
Sodium: 143 mmol/L (ref 135–145)

## 2015-08-01 ENCOUNTER — Encounter: Payer: Self-pay | Admitting: Internal Medicine

## 2015-08-03 ENCOUNTER — Encounter (HOSPITAL_COMMUNITY)
Admission: RE | Admit: 2015-08-03 | Discharge: 2015-08-03 | Disposition: A | Discharge status: Home / Self Care | Payer: Medicare Other | Source: Skilled Nursing Facility | Attending: Internal Medicine | Admitting: Internal Medicine

## 2015-08-03 LAB — BASIC METABOLIC PANEL
Anion gap: 7 (ref 5–15)
BUN: 49 mg/dL — AB (ref 6–20)
CALCIUM: 8.7 mg/dL — AB (ref 8.9–10.3)
CHLORIDE: 110 mmol/L (ref 101–111)
CO2: 26 mmol/L (ref 22–32)
CREATININE: 1.63 mg/dL — AB (ref 0.44–1.00)
GFR, EST AFRICAN AMERICAN: 30 mL/min — AB (ref >60)
GFR, EST NON AFRICAN AMERICAN: 26 mL/min — AB (ref >60)
Glucose, Bld: 104 mg/dL — ABNORMAL HIGH (ref 65–99)
Potassium: 4.8 mmol/L (ref 3.5–5.1)
SODIUM: 143 mmol/L (ref 135–145)

## 2015-08-07 ENCOUNTER — Encounter (HOSPITAL_COMMUNITY)
Admission: AD | Admit: 2015-08-07 | Discharge: 2015-08-07 | Disposition: A | Discharge status: Home / Self Care | Payer: Medicare Other | Source: Skilled Nursing Facility | Attending: Internal Medicine | Admitting: Internal Medicine

## 2015-08-07 LAB — BASIC METABOLIC PANEL
Anion gap: 6 (ref 5–15)
BUN: 47 mg/dL — ABNORMAL HIGH (ref 6–20)
CHLORIDE: 112 mmol/L — AB (ref 101–111)
CO2: 25 mmol/L (ref 22–32)
Calcium: 8.4 mg/dL — ABNORMAL LOW (ref 8.9–10.3)
Creatinine, Ser: 1.65 mg/dL — ABNORMAL HIGH (ref 0.44–1.00)
GFR calc non Af Amer: 26 mL/min — ABNORMAL LOW (ref >60)
GFR, EST AFRICAN AMERICAN: 30 mL/min — AB (ref >60)
Glucose, Bld: 104 mg/dL — ABNORMAL HIGH (ref 65–99)
POTASSIUM: 5 mmol/L (ref 3.5–5.1)
SODIUM: 143 mmol/L (ref 135–145)

## 2015-08-10 ENCOUNTER — Encounter (HOSPITAL_COMMUNITY)
Admission: RE | Admit: 2015-08-10 | Discharge: 2015-08-10 | Disposition: A | Discharge status: Home / Self Care | Payer: Medicare Other | Source: Skilled Nursing Facility | Attending: Internal Medicine | Admitting: Internal Medicine

## 2015-08-10 LAB — BASIC METABOLIC PANEL
ANION GAP: 7 (ref 5–15)
BUN: 40 mg/dL — ABNORMAL HIGH (ref 6–20)
CO2: 27 mmol/L (ref 22–32)
Calcium: 8.9 mg/dL (ref 8.9–10.3)
Chloride: 110 mmol/L (ref 101–111)
Creatinine, Ser: 1.46 mg/dL — ABNORMAL HIGH (ref 0.44–1.00)
GFR calc Af Amer: 35 mL/min — ABNORMAL LOW (ref >60)
GFR calc non Af Amer: 30 mL/min — ABNORMAL LOW (ref >60)
GLUCOSE: 109 mg/dL — AB (ref 65–99)
POTASSIUM: 4.7 mmol/L (ref 3.5–5.1)
Sodium: 144 mmol/L (ref 135–145)

## 2015-08-13 ENCOUNTER — Inpatient Hospital Stay (HOSPITAL_COMMUNITY)
Admission: EM | Admit: 2015-08-13 | Discharge: 2015-08-17 | DRG: 481 | Disposition: A | Discharge status: Skilled Nursing Facility | Payer: Medicare Other | Attending: Internal Medicine | Admitting: Internal Medicine

## 2015-08-13 ENCOUNTER — Encounter (HOSPITAL_COMMUNITY): Payer: Self-pay

## 2015-08-13 ENCOUNTER — Ambulatory Visit (HOSPITAL_COMMUNITY)
Admission: RE | Admit: 2015-08-13 | Discharge: 2015-08-13 | Disposition: A | Discharge status: Home / Self Care | Payer: Medicare Other | Source: Ambulatory Visit | Attending: Internal Medicine | Admitting: Internal Medicine

## 2015-08-13 DIAGNOSIS — Z66 Do not resuscitate: Secondary | ICD-10-CM | POA: Diagnosis not present

## 2015-08-13 DIAGNOSIS — M25552 Pain in left hip: Secondary | ICD-10-CM | POA: Diagnosis present

## 2015-08-13 DIAGNOSIS — F05 Delirium due to known physiological condition: Secondary | ICD-10-CM | POA: Diagnosis not present

## 2015-08-13 DIAGNOSIS — Z880 Allergy status to penicillin: Secondary | ICD-10-CM | POA: Diagnosis not present

## 2015-08-13 DIAGNOSIS — D631 Anemia in chronic kidney disease: Secondary | ICD-10-CM | POA: Diagnosis not present

## 2015-08-13 DIAGNOSIS — S72142A Displaced intertrochanteric fracture of left femur, initial encounter for closed fracture: Secondary | ICD-10-CM | POA: Diagnosis present

## 2015-08-13 DIAGNOSIS — I129 Hypertensive chronic kidney disease with stage 1 through stage 4 chronic kidney disease, or unspecified chronic kidney disease: Secondary | ICD-10-CM | POA: Diagnosis not present

## 2015-08-13 DIAGNOSIS — X58XXXA Exposure to other specified factors, initial encounter: Secondary | ICD-10-CM

## 2015-08-13 DIAGNOSIS — Z8249 Family history of ischemic heart disease and other diseases of the circulatory system: Secondary | ICD-10-CM | POA: Diagnosis not present

## 2015-08-13 DIAGNOSIS — W050XXA Fall from non-moving wheelchair, initial encounter: Secondary | ICD-10-CM | POA: Diagnosis not present

## 2015-08-13 DIAGNOSIS — Z9049 Acquired absence of other specified parts of digestive tract: Secondary | ICD-10-CM

## 2015-08-13 DIAGNOSIS — Z888 Allergy status to other drugs, medicaments and biological substances status: Secondary | ICD-10-CM

## 2015-08-13 DIAGNOSIS — E039 Hypothyroidism, unspecified: Secondary | ICD-10-CM | POA: Diagnosis present

## 2015-08-13 DIAGNOSIS — N183 Chronic kidney disease, stage 3 unspecified: Secondary | ICD-10-CM | POA: Diagnosis present

## 2015-08-13 DIAGNOSIS — Z7982 Long term (current) use of aspirin: Secondary | ICD-10-CM

## 2015-08-13 DIAGNOSIS — M1711 Unilateral primary osteoarthritis, right knee: Secondary | ICD-10-CM | POA: Diagnosis not present

## 2015-08-13 DIAGNOSIS — Z79899 Other long term (current) drug therapy: Secondary | ICD-10-CM | POA: Diagnosis not present

## 2015-08-13 DIAGNOSIS — E875 Hyperkalemia: Secondary | ICD-10-CM | POA: Diagnosis not present

## 2015-08-13 DIAGNOSIS — Z01818 Encounter for other preprocedural examination: Secondary | ICD-10-CM

## 2015-08-13 DIAGNOSIS — S7292XA Unspecified fracture of left femur, initial encounter for closed fracture: Secondary | ICD-10-CM

## 2015-08-13 DIAGNOSIS — S72142S Displaced intertrochanteric fracture of left femur, sequela: Secondary | ICD-10-CM | POA: Diagnosis not present

## 2015-08-13 DIAGNOSIS — F0390 Unspecified dementia without behavioral disturbance: Secondary | ICD-10-CM | POA: Diagnosis present

## 2015-08-13 DIAGNOSIS — S72002A Fracture of unspecified part of neck of left femur, initial encounter for closed fracture: Secondary | ICD-10-CM | POA: Diagnosis present

## 2015-08-13 DIAGNOSIS — E038 Other specified hypothyroidism: Secondary | ICD-10-CM | POA: Diagnosis not present

## 2015-08-13 DIAGNOSIS — D62 Acute posthemorrhagic anemia: Secondary | ICD-10-CM | POA: Diagnosis not present

## 2015-08-13 DIAGNOSIS — S728X2A Other fracture of left femur, initial encounter for closed fracture: Secondary | ICD-10-CM

## 2015-08-13 DIAGNOSIS — Z90710 Acquired absence of both cervix and uterus: Secondary | ICD-10-CM

## 2015-08-13 DIAGNOSIS — Y92129 Unspecified place in nursing home as the place of occurrence of the external cause: Secondary | ICD-10-CM

## 2015-08-13 DIAGNOSIS — N390 Urinary tract infection, site not specified: Secondary | ICD-10-CM | POA: Diagnosis present

## 2015-08-13 DIAGNOSIS — D638 Anemia in other chronic diseases classified elsewhere: Secondary | ICD-10-CM | POA: Diagnosis not present

## 2015-08-13 HISTORY — DX: Displaced intertrochanteric fracture of unspecified femur, initial encounter for closed fracture: S72.143A

## 2015-08-13 LAB — CBC WITH DIFFERENTIAL/PLATELET
Basophils Absolute: 0 10*3/uL (ref 0.0–0.1)
Basophils Relative: 0 %
EOS ABS: 0.3 10*3/uL (ref 0.0–0.7)
EOS PCT: 3 %
HCT: 32.4 % — ABNORMAL LOW (ref 36.0–46.0)
Hemoglobin: 10.3 g/dL — ABNORMAL LOW (ref 12.0–15.0)
LYMPHS ABS: 1.8 10*3/uL (ref 0.7–4.0)
LYMPHS PCT: 18 %
MCH: 29.7 pg (ref 26.0–34.0)
MCHC: 31.8 g/dL (ref 30.0–36.0)
MCV: 93.4 fL (ref 78.0–100.0)
MONOS PCT: 13 %
Monocytes Absolute: 1.3 10*3/uL — ABNORMAL HIGH (ref 0.1–1.0)
Neutro Abs: 6.5 10*3/uL (ref 1.7–7.7)
Neutrophils Relative %: 66 %
PLATELETS: 365 10*3/uL (ref 150–400)
RBC: 3.47 MIL/uL — ABNORMAL LOW (ref 3.87–5.11)
RDW: 14.8 % (ref 11.5–15.5)
WBC: 9.9 10*3/uL (ref 4.0–10.5)

## 2015-08-13 LAB — BASIC METABOLIC PANEL
Anion gap: 6 (ref 5–15)
BUN: 48 mg/dL — AB (ref 6–20)
CHLORIDE: 107 mmol/L (ref 101–111)
CO2: 24 mmol/L (ref 22–32)
CREATININE: 1.85 mg/dL — AB (ref 0.44–1.00)
Calcium: 8.4 mg/dL — ABNORMAL LOW (ref 8.9–10.3)
GFR calc Af Amer: 26 mL/min — ABNORMAL LOW (ref >60)
GFR calc non Af Amer: 23 mL/min — ABNORMAL LOW (ref >60)
GLUCOSE: 127 mg/dL — AB (ref 65–99)
Potassium: 4.9 mmol/L (ref 3.5–5.1)
Sodium: 137 mmol/L (ref 135–145)

## 2015-08-13 LAB — URINE MICROSCOPIC-ADD ON: RBC / HPF: NONE SEEN RBC/hpf (ref 0–5)

## 2015-08-13 LAB — URINALYSIS, ROUTINE W REFLEX MICROSCOPIC
BILIRUBIN URINE: NEGATIVE
GLUCOSE, UA: NEGATIVE mg/dL
HGB URINE DIPSTICK: NEGATIVE
Ketones, ur: NEGATIVE mg/dL
Nitrite: NEGATIVE
SPECIFIC GRAVITY, URINE: 1.02 (ref 1.005–1.030)
pH: 5.5 (ref 5.0–8.0)

## 2015-08-13 MED ORDER — LEVOTHYROXINE SODIUM 125 MCG PO TABS
125.0000 ug | ORAL_TABLET | Freq: Every day | ORAL | Status: DC
  Filled 2015-08-13 (×2): qty 1

## 2015-08-13 MED ORDER — MORPHINE SULFATE (PF) 2 MG/ML IV SOLN: 0.5000 mg | INTRAVENOUS | Status: DC | PRN

## 2015-08-13 MED ORDER — SODIUM CHLORIDE 0.9 % IV SOLN: INTRAVENOUS | Status: DC

## 2015-08-13 MED ORDER — DEXTROSE-NACL 5-0.45 % IV SOLN
INTRAVENOUS | Status: DC
  Administered 2015-08-14: 01:00:00 via INTRAVENOUS

## 2015-08-13 MED ORDER — ENOXAPARIN SODIUM 30 MG/0.3ML ~~LOC~~ SOLN
30.0000 mg | Freq: Every day | SUBCUTANEOUS | Status: DC
  Administered 2015-08-14: 30 mg via SUBCUTANEOUS
  Filled 2015-08-13 (×2): qty 0.3

## 2015-08-13 MED ORDER — SODIUM CHLORIDE 0.9 % IV BOLUS (SEPSIS)
500.0000 mL | Freq: Once | INTRAVENOUS | Status: AC
  Administered 2015-08-13: 500 mL via INTRAVENOUS

## 2015-08-13 MED ORDER — OSELTAMIVIR PHOSPHATE 30 MG PO CAPS
30.0000 mg | ORAL_CAPSULE | Freq: Every day | ORAL | Status: DC
  Filled 2015-08-13 (×2): qty 1

## 2015-08-13 NOTE — ED Provider Notes (Signed)
CSN: [id number]     Arrival date & time 08/13/15  1842 History   First MD Initiated Contact with Patient 08/13/15 1858     Chief Complaint  Patient presents with  . Hip Injury     The history is provided by the patient, the nursing home and a relative. The history is limited by the condition of the patient (Hx dementia).  Pt was seen at 1900. Per NH report and family: Pt fell from wheelchair yesterday, c/o left hip pain. XR at [facility] today revealed new fracture. Pt is s/p left hip fx repair on 07/20/15. Pt has significant hx of dementia.    Past Medical History  Diagnosis Date  . Hypothyroidism   . Shortness of breath   . Schatzki's ring   . Hypertension   . Vertebral compression fracture (HCC)   . Arthritis     right knee  . Peripheral neuropathy (HCC)   . Hiatal hernia   . Fx intertrochanteric hip (HCC) 07/23/15   Past Surgical History  Procedure Laterality Date  . Esophagogastroduodenoscopy  10/24/08    normal, status post [facility] dilator, small hiatal hernia deformity of the antrum/pylorus  . Colonoscopy  10/24/08    anal papilla otherwise normal, pan colonic diverticulum and colonic mucosa  . Appendectomy  1941  . Abdominal hysterectomy  1977  . Cholecystectomy  1967  . Spine surgery  1986  . Knee surgery  09/2008  . Hemorrhoid surgery  1999  . Colonoscopy  remote    Dr. Jerolyn Shin Smith-->polpys per patient  . Ercp  1995    Dr. Rourk--> for dilated biliary tree. no stone or neoplasm or stricture found  . Esophagogastroduodenoscopy  1995    Dr. Rourk--> empiric dilation of esophagus with resolution of dysphagia  . Esophagogastroduodenoscopy   09/21/2011    RMR: Noncritical Schatzki's ring s/p dilated/Abnormal esophageal mucosa/Small  hiatal hermia  . Shoulder surgery    . Cystoscopy with stent placement Bilateral 05/20/2014    Procedure: CYSTOSCOPY WITH BILATERAL DOUBLE J STENT PLACEMENT;  Surgeon: Chelsea Aus, MD;  Location: [facility];  Service: Urology;  Laterality:  Bilateral;  . Holmium laser application Bilateral 06/26/2014    Procedure: HOLMIUM LASER APPLICATION;  Surgeon: Chelsea Aus, MD;  Location: [facility];  Service: Urology;  Laterality: Bilateral;  . Hip arthroplasty Right 07/04/2014    Procedure: RIGHT PARTIAL HIP REPLACEMENT;  Surgeon: Vickki Hearing, MD;  Location: [facility];  Service: Orthopedics;  Laterality: Right;  . Intramedullary (im) nail intertrochanteric Left 07/20/2015    Procedure: INTRAMEDULLARY (IM) NAIL INTERTROCHANTRIC LEFT HIP;  Surgeon: Kathryne Hitch, MD;  Location: [facility] OR;  Service: Orthopedics;  Laterality: Left;  . Hip fracture surgery  left hip repair   Family History  Problem Relation Age of Onset  . Heart attack Father 47    deceased   Social History  Substance Use Topics  . Smoking status: Never Smoker   . Smokeless tobacco: None  . Alcohol Use: No    Review of Systems  Unable to perform ROS: Dementia      Allergies  Lactulose; Penicillins; and Prilosec  Home Medications   Prior to Admission medications   Medication Sig Start Date End Date Taking? Authorizing Provider  acetaminophen (PAIN RELIEVER) 325 MG tablet Take 650 mg by mouth every 6 (six) hours as needed for mild pain or moderate pain.    Historical Provider, MD  aspirin EC 325 MG EC tablet Take 1 tablet (325 mg total)  by mouth 2 (two) times daily after a meal. 07/21/15   Kathryne Hitch, MD  famotidine (PEPCID) 20 MG tablet Take 1 tablet (20 mg total) by mouth 2 (two) times daily. 07/22/15   Leroy Sea, MD  gabapentin (NEURONTIN) 300 MG capsule Take 1 capsule by mouth 3 (three) times daily. 07/14/15   Historical Provider, MD  HYDROcodone-acetaminophen (NORCO/VICODIN) 5-325 MG tablet Take 1-2 tablets by mouth every 4 (four) hours as needed for moderate pain. 07/21/15   Kathryne Hitch, MD  ipratropium-albuterol (DUONEB) 0.5-2.5 (3) MG/3ML SOLN Take 3 mLs by nebulization every 6 (six) hours as needed (Shortness of breath).  Reported on 07/19/2015    Historical Provider, MD  levothyroxine (SYNTHROID, LEVOTHROID) 100 MCG tablet Take 100 mcg by mouth daily before breakfast.    Historical Provider, MD  loratadine (CLARITIN) 10 MG tablet Take 1 tablet (10 mg total) by mouth daily. 07/23/15   Leroy Sea, MD  senna-docusate (SENOKOT-S) 8.6-50 MG tablet Take 1 tablet by mouth at bedtime as needed for mild constipation. 07/22/15   Leroy Sea, MD  tamsulosin (FLOMAX) 0.4 MG CAPS capsule Take 1 capsule (0.4 mg total) by mouth daily. 07/23/15   Leroy Sea, MD  traMADol (ULTRAM) 50 MG tablet Take 1 tablet (50 mg total) by mouth every 6 (six) hours as needed for moderate pain. 07/07/14   Angus McInnis, MD   BP 114/76 mmHg  Pulse 65  Temp(Src) 98.6 F (37 C) (Axillary)  Resp 17  Ht  (1.575 m)  Wt 160 lb (72.576 kg)  BMI 29.26 kg/m2  SpO2 94% Physical Exam  1905: Physical examination:  Nursing notes reviewed; Vital signs and O2 SAT reviewed;  Constitutional: Well developed, Well nourished, In no acute distress; Head:  Normocephalic, atraumatic; Eyes: EOMI, PERRL, No scleral icterus; ENMT: Mouth and pharynx normal, Mucous membranes dry; Neck: Supple, Full range of motion, No lymphadenopathy; Cardiovascular: Regular rate and rhythm, No gallop; Respiratory: Breath sounds clear & equal bilaterally, No wheezes.  Normal respiratory effort/excursion; Chest: Nontender, Movement normal; Abdomen: Soft, Nontender, Nondistended, Normal bowel sounds; Genitourinary: No CVA tenderness; Extremities: Pulses normal, Pelvis stable. +left hip tenderness to palp, +externally rotated. Strong pedal pulse, NMS intact distally LLE. No edema, No calf edema or asymmetry.; Neuro: Awake, alert, confused per hx dementia. No facial droop. Speech clear. +decreased ROM left hip, otherwise moves extremities on stretcher..; Skin: Color normal, Warm, Dry.   ED Course  Procedures (including critical care time) Labs Review  Imaging Review  I have  personally reviewed and evaluated these images and lab results as part of my medical decision-making.   EKG Interpretation None      MDM  MDM Reviewed: previous chart, nursing note and vitals Reviewed previous: labs Interpretation: labs and x-ray     Results for orders placed or performed during the hospital encounter of 08/13/15  Basic metabolic panel  Result Value Ref Range   Sodium 137 135 - 145 mmol/L   Potassium 4.9 3.5 - 5.1 mmol/L   Chloride 107 101 - 111 mmol/L   CO2 24 22 - 32 mmol/L   Glucose, Bld 127 (H) 65 - 99 mg/dL   BUN 48 (H) 6 - 20 mg/dL   Creatinine, Ser 2.95 (H) 0.44 - 1.00 mg/dL   Calcium 8.4 (L) 8.9 - 10.3 mg/dL   GFR calc non Af Amer 23 (L) >60 mL/min   GFR calc Af Amer 26 (L) >60 mL/min   Anion gap 6 5 -  15  CBC with Differential  Result Value Ref Range   WBC 9.9 4.0 - 10.5 K/uL   RBC 3.47 (L) 3.87 - 5.11 MIL/uL   Hemoglobin 10.3 (L) 12.0 - 15.0 g/dL   HCT 16.1 (L) 09.6 - 04.5 %   MCV 93.4 78.0 - 100.0 fL   MCH 29.7 26.0 - 34.0 pg   MCHC 31.8 30.0 - 36.0 g/dL   RDW 40.9 81.1 - 91.4 %   Platelets 365 150 - 400 K/uL   Neutrophils Relative % 66 %   Neutro Abs 6.5 1.7 - 7.7 K/uL   Lymphocytes Relative 18 %   Lymphs Abs 1.8 0.7 - 4.0 K/uL   Monocytes Relative 13 %   Monocytes Absolute 1.3 (H) 0.1 - 1.0 K/uL   Eosinophils Relative 3 %   Eosinophils Absolute 0.3 0.0 - 0.7 K/uL   Basophils Relative 0 %   Basophils Absolute 0.0 0.0 - 0.1 K/uL   Dg Hip Unilat With Pelvis 2-3 Views Left 08/13/2015  CLINICAL DATA:  82 year old female with acute left hip pain today. Left hip intertrochanteric fracture and internal fixation 3 weeks ago. EXAM: DG HIP (WITH OR WITHOUT PELVIS) 2-3V LEFT COMPARISON:  07/20/2015 postoperative spot films. FINDINGS: An intra medullary nail and screws within the left femur are again noted. The intertrochanteric fracture is again identified, but now visualized is an oblique fracture through the infratrochanteric region extending  inferiorly from medial to lateral with 6 mm medial displacement. IMPRESSION: New oblique fracture of the infratrochanteric left femur with 6 mm medial displacement. Electronically Signed   By: Harmon Pier M.D.   On: 08/13/2015 15:52    2025:  T/C to [facility] Dr. Roda Shutters, case discussed, including:  HPI, pertinent PM/SHx, VS/PE, dx testing, ED course and treatment:  He has viewed the XR images, requests to transfer/admit to [facility] under [facility] and Dr. Magnus Ivan will see pt tomorrow (possibly go to OR tomorrow also). T/C to [facility] Dr. Gonzella Lex, case discussed, including:  HPI, pertinent PM/SHx, VS/PE, dx testing, ED course and treatment:  Agreeable to evaluate and facilitate transfer to [facility], requests to write temporary orders, obtain medical bed to team [facility].   Samuel Jester, DO 08/16/15 1534

## 2015-08-13 NOTE — H&P (Signed)
[facility] History and Physical  GRACEANN BOILEAU ZOX:[id number] DOB: 1921-02-14 DOA: 08/13/2015  Referring physician: Dr. Clarene Duke PCP: [name], MD   Chief Complaint:  Fall at nursing home  HPI:  82 year old female with history of hypothyroidism, hypertension, mild-to-moderate dementia recently hospitalized at [facility] from 2/5-2/9 for left intertrochanteric hip fracture status post IM nailing and discharged to [facility] skilled nursing facility. History provided by son at bedside as patient is confused. After being discharged to the skilled nursing facility patient was participating with therapy. Yesterday she fell from her wheelchair and complained of left hip pain. X-ray done at the facility today showed new oblique fracture of the left intertrochanteric femur with 6 mm medial displacement. Patient transferred to ED. No history of fever, chills, nausea , vomiting, chest pain, palpitations, SOB, abdominal pain, bowel or urinary symptoms. Patient had urinary retention postoperatively and was discharged to facility once fully catheter. Foley was removed on 2/16 without any further problems.  Review of Systems:  As outlined in history of present illness. 12 point review of systems Limited as patient unable to provide much history.   Past Medical History  Diagnosis Date  . Hypothyroidism   . Shortness of breath   . Schatzki's ring   . Hypertension   . Vertebral compression fracture (HCC)   . Arthritis     right knee  . Peripheral neuropathy (HCC)   . Hiatal hernia   . Fx intertrochanteric hip (HCC) 07/23/15   Past Surgical History  Procedure Laterality Date  . Esophagogastroduodenoscopy  10/24/08    normal, status post [facility] dilator, small hiatal hernia deformity of the antrum/pylorus  . Colonoscopy  10/24/08    anal papilla otherwise normal, pan colonic diverticulum and colonic mucosa  . Appendectomy  1941  . Abdominal hysterectomy  1977  . Cholecystectomy  1967   . Spine surgery  1986  . Knee surgery  09/2008  . Hemorrhoid surgery  1999  . Colonoscopy  remote    Dr. Jerolyn Shin Smith-->polpys per patient  . Ercp  1995    Dr. Rourk--> for dilated biliary tree. no stone or neoplasm or stricture found  . Esophagogastroduodenoscopy  1995    Dr. Rourk--> empiric dilation of esophagus with resolution of dysphagia  . Esophagogastroduodenoscopy   09/21/2011    RMR: Noncritical Schatzki's ring s/p dilated/Abnormal esophageal mucosa/Small  hiatal hermia  . Shoulder surgery    . Cystoscopy with stent placement Bilateral 05/20/2014    Procedure: CYSTOSCOPY WITH BILATERAL DOUBLE J STENT PLACEMENT;  Surgeon: Chelsea Aus, MD;  Location: [facility];  Service: Urology;  Laterality: Bilateral;  . Holmium laser application Bilateral 06/26/2014    Procedure: HOLMIUM LASER APPLICATION;  Surgeon: Chelsea Aus, MD;  Location: [facility];  Service: Urology;  Laterality: Bilateral;  . Hip arthroplasty Right 07/04/2014    Procedure: RIGHT PARTIAL HIP REPLACEMENT;  Surgeon: Vickki Hearing, MD;  Location: [facility];  Service: Orthopedics;  Laterality: Right;  . Intramedullary (im) nail intertrochanteric Left 07/20/2015    Procedure: INTRAMEDULLARY (IM) NAIL INTERTROCHANTRIC LEFT HIP;  Surgeon: Kathryne Hitch, MD;  Location: [facility] OR;  Service: Orthopedics;  Laterality: Left;  . Hip fracture surgery  left hip repair   Social History:  reports that she has never smoked. She does not have any smokeless tobacco history on file. She reports that she does not drink alcohol or use illicit drugs.  Allergies  Allergen Reactions  . Lactulose     REACTION: unknown reaction  .  Penicillins     REACTION: unknown reaction  . Prilosec [Omeprazole] Itching and Rash    Family History  Problem Relation Age of Onset  . Heart attack Father 18    deceased    Prior to Admission medications   Medication Sig Start Date End Date Taking? Authorizing Provider  acetaminophen (PAIN  RELIEVER) 325 MG tablet Take 650 mg by mouth every 6 (six) hours as needed for mild pain or moderate pain.    Historical Provider, MD  aspirin EC 325 MG EC tablet Take 1 tablet (325 mg total) by mouth 2 (two) times daily after a meal. 07/21/15   Kathryne Hitch, MD  famotidine (PEPCID) 20 MG tablet Take 1 tablet (20 mg total) by mouth 2 (two) times daily. 07/22/15   Leroy Sea, MD  gabapentin (NEURONTIN) 300 MG capsule Take 1 capsule by mouth 3 (three) times daily. 07/14/15   Historical Provider, MD  HYDROcodone-acetaminophen (NORCO/VICODIN) 5-325 MG tablet Take 1-2 tablets by mouth every 4 (four) hours as needed for moderate pain. 07/21/15   Kathryne Hitch, MD  ipratropium-albuterol (DUONEB) 0.5-2.5 (3) MG/3ML SOLN Take 3 mLs by nebulization every 6 (six) hours as needed (Shortness of breath). Reported on 07/19/2015    Historical Provider, MD  levothyroxine (SYNTHROID, LEVOTHROID) 100 MCG tablet Take 100 mcg by mouth daily before breakfast.    Historical Provider, MD  loratadine (CLARITIN) 10 MG tablet Take 1 tablet (10 mg total) by mouth daily. 07/23/15   Leroy Sea, MD  senna-docusate (SENOKOT-S) 8.6-50 MG tablet Take 1 tablet by mouth at bedtime as needed for mild constipation. 07/22/15   Leroy Sea, MD  tamsulosin (FLOMAX) 0.4 MG CAPS capsule Take 1 capsule (0.4 mg total) by mouth daily. 07/23/15   Leroy Sea, MD  traMADol (ULTRAM) 50 MG tablet Take 1 tablet (50 mg total) by mouth every 6 (six) hours as needed for moderate pain. 07/07/14   Butch Penny, MD     Physical Exam:  Filed Vitals:   08/13/15 1900 08/13/15 1930 08/13/15 1951 08/13/15 2030  BP: 94/70 104/64 104/64 114/76  Pulse: 70 69 69 65  Temp:      TempSrc:      Resp:   17   Height:      Weight:      SpO2: 95% 94% 98% 94%    Constitutional: Vital signs reviewed.  Patient is a well-developed and well-nourished in no acute distress. HEENT: no pallor, no icterus, moist oral mucosa, no cervical  lymphadenopathy Cardiovascular: RRR, S1 normal, S2 normal, no MRG Chest: CTAB, no wheezes, rales, or rhonchi Abdominal: Soft. Non-tender, non-distended, bowel sounds are normal, no masses, organomegaly, or guarding present.  GU: no CVA tenderness Ext: warm, no edema Neurological: A&O x3, non focal  Labs on Admission:  Basic Metabolic Panel:  Recent Labs Lab 08/07/15 0715 08/10/15 0630 08/13/15 1930  NA 143 144 137  K 5.0 4.7 4.9  CL 112* 110 107  CO2 GLUCOSE 104* 109* 127*  BUN 47* 40* 48*  CREATININE 1.65* 1.46* 1.85*  CALCIUM 8.4* 8.9 8.4*   Liver Function Tests: No results for input(s): AST, ALT, ALKPHOS, BILITOT, PROT, ALBUMIN in the last 168 hours. No results for input(s): LIPASE, AMYLASE in the last 168 hours. No results for input(s): AMMONIA in the last 168 hours. CBC:  Recent Labs Lab 08/13/15 1930  WBC 9.9  NEUTROABS 6.5  HGB 10.3*  HCT 32.4*  MCV 93.4  PLT  365   Cardiac Enzymes: No results for input(s): CKTOTAL, CKMB, CKMBINDEX, TROPONINI in the last 168 hours. BNP: Invalid input(s): POCBNP CBG: No results for input(s): GLUCAP in the last 168 hours.  Radiological Exams on Admission: Dg Hip Unilat With Pelvis 2-3 Views Left  08/13/2015  CLINICAL DATA:  82 year old female with acute left hip pain today. Left hip intertrochanteric fracture and internal fixation 3 weeks ago. EXAM: DG HIP (WITH OR WITHOUT PELVIS) 2-3V LEFT COMPARISON:  07/20/2015 postoperative spot films. FINDINGS: An intra medullary nail and screws within the left femur are again noted. The intertrochanteric fracture is again identified, but now visualized is an oblique fracture through the infratrochanteric region extending inferiorly from medial to lateral with 6 mm medial displacement. IMPRESSION: New oblique fracture of the infratrochanteric left femur with 6 mm medial displacement. Electronically Signed   By: Harmon Pier M.D.   On: 08/13/2015 15:52    EKG:  pending  Assessment/Plan  Principal Problem:   Intertrochanteric fracture of left femur (HCC) Secondary to Mechanical fall. Recent left hip fracture s/p repair and receiving therapy at SNF.  Transfer to [facility] med surg. Dr Magnus Ivan to see pt tomorrow. Keep NPO  IV fluids Check UA.  Active Problems:   Hypothyroidism Continue Synthroid.    Delirium superimposed on dementia Patient appears more confused then usual. Has mild to moderate dementia. He has minimal narcotics. Check UA and chest x-ray. Continue IV hydration.    CKD (chronic kidney disease) stage 3, GFR 30-59 ml/min Renal function at baseline.    Diet:NPO  DVT prophylaxis: sq lovenox   Code Status: DNR Family Communication: discussed with sons at bedside Disposition Plan: transfer to [facility] med surg bed. Discussed and updated my partner Dr Toniann Fail at [facility] of patient's transfer.  Eddie North [facility] Pager [phone]  Total time spent on admission :60 minutes  If 7PM-7AM, please contact night-coverage [email or web address] Password [email or web address] 08/13/2015, 9:07 PM

## 2015-08-13 NOTE — ED Notes (Signed)
Sent from [place] center. Family report pt fell last night approx 5pm. Pt complaining of worsening pain in left hip. Xray showed new oblique fx of intratrochanteric  Left femur with 6 mm displacement. S/P left hip fx sx 2 weeks ago by Dr. Magnus Ivan

## 2015-08-14 ENCOUNTER — Encounter (HOSPITAL_COMMUNITY): Payer: Self-pay | Admitting: Certified Registered Nurse Anesthetist

## 2015-08-14 ENCOUNTER — Inpatient Hospital Stay (HOSPITAL_COMMUNITY): Payer: Medicare Other

## 2015-08-14 ENCOUNTER — Inpatient Hospital Stay (HOSPITAL_COMMUNITY): Payer: Medicare Other | Admitting: Anesthesiology

## 2015-08-14 ENCOUNTER — Encounter (HOSPITAL_COMMUNITY)
Admission: EM | Disposition: A | Discharge status: Skilled Nursing Facility | Payer: Self-pay | Source: Home / Self Care | Attending: Internal Medicine

## 2015-08-14 DIAGNOSIS — E038 Other specified hypothyroidism: Secondary | ICD-10-CM

## 2015-08-14 DIAGNOSIS — F0390 Unspecified dementia without behavioral disturbance: Secondary | ICD-10-CM

## 2015-08-14 DIAGNOSIS — D638 Anemia in other chronic diseases classified elsewhere: Secondary | ICD-10-CM

## 2015-08-14 DIAGNOSIS — N183 Chronic kidney disease, stage 3 (moderate): Secondary | ICD-10-CM

## 2015-08-14 HISTORY — PX: ORIF FEMUR FRACTURE: SHX2119

## 2015-08-14 LAB — C DIFFICILE QUICK SCREEN W PCR REFLEX
C DIFFICILE (CDIFF) TOXIN: NEGATIVE
C Diff antigen: POSITIVE — AB

## 2015-08-14 LAB — CREATININE, SERUM
CREATININE: 1.74 mg/dL — AB (ref 0.44–1.00)
GFR, EST AFRICAN AMERICAN: 28 mL/min — AB (ref >60)
GFR, EST NON AFRICAN AMERICAN: 24 mL/min — AB (ref >60)

## 2015-08-14 LAB — CBC
HCT: 30 % — ABNORMAL LOW (ref 36.0–46.0)
Hemoglobin: 9.2 g/dL — ABNORMAL LOW (ref 12.0–15.0)
MCH: 29.3 pg (ref 26.0–34.0)
MCHC: 30.7 g/dL (ref 30.0–36.0)
MCV: 95.5 fL (ref 78.0–100.0)
Platelets: 321 10*3/uL (ref 150–400)
RBC: 3.14 MIL/uL — ABNORMAL LOW (ref 3.87–5.11)
RDW: 15 % (ref 11.5–15.5)
WBC: 10.5 10*3/uL (ref 4.0–10.5)

## 2015-08-14 SURGERY — OPEN REDUCTION INTERNAL FIXATION (ORIF) DISTAL FEMUR FRACTURE
Anesthesia: General | Site: Hip | Laterality: Left

## 2015-08-14 MED ORDER — ASPIRIN EC 325 MG PO TBEC
325.0000 mg | DELAYED_RELEASE_TABLET | Freq: Every day | ORAL | Status: DC
  Administered 2015-08-15 – 2015-08-17 (×3): 325 mg via ORAL
  Filled 2015-08-14 (×5): qty 1

## 2015-08-14 MED ORDER — LIDOCAINE HCL (CARDIAC) 20 MG/ML IV SOLN
INTRAVENOUS | Status: AC
Start: 2015-08-14 — End: 2015-08-14
  Filled 2015-08-14: qty 5

## 2015-08-14 MED ORDER — MIDAZOLAM HCL 2 MG/2ML IJ SOLN
INTRAMUSCULAR | Status: AC
  Filled 2015-08-14: qty 2

## 2015-08-14 MED ORDER — ONDANSETRON HCL 4 MG PO TABS: 4.0000 mg | ORAL_TABLET | Freq: Four times a day (QID) | ORAL | Status: DC | PRN

## 2015-08-14 MED ORDER — MORPHINE SULFATE (PF) 2 MG/ML IV SOLN
0.5000 mg | INTRAVENOUS | Status: DC | PRN
  Administered 2015-08-14 – 2015-08-15 (×3): 0.5 mg via INTRAVENOUS
  Filled 2015-08-14 (×3): qty 1

## 2015-08-14 MED ORDER — CEFAZOLIN SODIUM-DEXTROSE 2-3 GM-% IV SOLR
INTRAVENOUS | Status: DC | PRN
  Administered 2015-08-14: 2 g via INTRAVENOUS

## 2015-08-14 MED ORDER — PHENOL 1.4 % MT LIQD: 1.0000 | OROMUCOSAL | Status: DC | PRN

## 2015-08-14 MED ORDER — DEXAMETHASONE SODIUM PHOSPHATE 10 MG/ML IJ SOLN
INTRAMUSCULAR | Status: DC | PRN
  Administered 2015-08-14: 10 mg via INTRAVENOUS

## 2015-08-14 MED ORDER — ACETAMINOPHEN 325 MG PO TABS: 650.0000 mg | ORAL_TABLET | Freq: Four times a day (QID) | ORAL | Status: DC | PRN

## 2015-08-14 MED ORDER — PROPOFOL 10 MG/ML IV BOLUS
INTRAVENOUS | Status: AC
  Filled 2015-08-14: qty 20

## 2015-08-14 MED ORDER — LACTATED RINGERS IV SOLN
INTRAVENOUS | Status: DC | PRN
  Administered 2015-08-14: 18:00:00 via INTRAVENOUS

## 2015-08-14 MED ORDER — ACETAMINOPHEN 650 MG RE SUPP: 650.0000 mg | Freq: Four times a day (QID) | RECTAL | Status: DC | PRN

## 2015-08-14 MED ORDER — SODIUM CHLORIDE 0.9 % IV SOLN
INTRAVENOUS | Status: DC
  Administered 2015-08-15 – 2015-08-16 (×2): via INTRAVENOUS

## 2015-08-14 MED ORDER — CEFAZOLIN SODIUM-DEXTROSE 2-3 GM-% IV SOLR
INTRAVENOUS | Status: AC
  Filled 2015-08-14: qty 50

## 2015-08-14 MED ORDER — ONDANSETRON HCL 4 MG/2ML IJ SOLN
INTRAMUSCULAR | Status: DC | PRN
  Administered 2015-08-14: 4 mg via INTRAVENOUS

## 2015-08-14 MED ORDER — HYDROMORPHONE HCL 1 MG/ML IJ SOLN: 0.2500 mg | INTRAMUSCULAR | Status: DC | PRN

## 2015-08-14 MED ORDER — PHENYLEPHRINE HCL 10 MG/ML IJ SOLN
INTRAMUSCULAR | Status: DC | PRN
  Administered 2015-08-14 (×2): 40 ug via INTRAVENOUS

## 2015-08-14 MED ORDER — HYDROCODONE-ACETAMINOPHEN 5-325 MG PO TABS
1.0000 | ORAL_TABLET | Freq: Four times a day (QID) | ORAL | Status: DC | PRN
  Administered 2015-08-16: 2 via ORAL
  Filled 2015-08-14 (×2): qty 1

## 2015-08-14 MED ORDER — MEPERIDINE HCL 25 MG/ML IJ SOLN: 6.2500 mg | INTRAMUSCULAR | Status: DC | PRN

## 2015-08-14 MED ORDER — METOCLOPRAMIDE HCL 5 MG/ML IJ SOLN: 5.0000 mg | Freq: Three times a day (TID) | INTRAMUSCULAR | Status: DC | PRN

## 2015-08-14 MED ORDER — LACTATED RINGERS IV SOLN
INTRAVENOUS | Status: DC | PRN
  Administered 2015-08-14: 17:00:00 via INTRAVENOUS

## 2015-08-14 MED ORDER — METHOCARBAMOL 500 MG PO TABS
500.0000 mg | ORAL_TABLET | Freq: Four times a day (QID) | ORAL | Status: DC | PRN
  Administered 2015-08-16: 500 mg via ORAL
  Filled 2015-08-14 (×2): qty 1

## 2015-08-14 MED ORDER — ONDANSETRON HCL 4 MG/2ML IJ SOLN
INTRAMUSCULAR | Status: AC
  Filled 2015-08-14: qty 2

## 2015-08-14 MED ORDER — CEFAZOLIN SODIUM-DEXTROSE 2-3 GM-% IV SOLR: 2.0000 g | Freq: Once | INTRAVENOUS | Status: DC

## 2015-08-14 MED ORDER — ONDANSETRON HCL 4 MG/2ML IJ SOLN: 4.0000 mg | Freq: Four times a day (QID) | INTRAMUSCULAR | Status: DC | PRN

## 2015-08-14 MED ORDER — LIDOCAINE HCL (CARDIAC) 20 MG/ML IV SOLN
INTRAVENOUS | Status: DC | PRN
  Administered 2015-08-14: 75 mg via INTRAVENOUS

## 2015-08-14 MED ORDER — METOCLOPRAMIDE HCL 10 MG PO TABS: 5.0000 mg | ORAL_TABLET | Freq: Three times a day (TID) | ORAL | Status: DC | PRN

## 2015-08-14 MED ORDER — OXYCODONE HCL 5 MG/5ML PO SOLN: 5.0000 mg | Freq: Once | ORAL | Status: DC | PRN

## 2015-08-14 MED ORDER — METHOCARBAMOL 1000 MG/10ML IJ SOLN
500.0000 mg | Freq: Four times a day (QID) | INTRAVENOUS | Status: DC | PRN
  Filled 2015-08-14: qty 5

## 2015-08-14 MED ORDER — PROPOFOL 10 MG/ML IV BOLUS
INTRAVENOUS | Status: DC | PRN
  Administered 2015-08-14: 75 mg via INTRAVENOUS

## 2015-08-14 MED ORDER — ROCURONIUM BROMIDE 100 MG/10ML IV SOLN
INTRAVENOUS | Status: AC
  Filled 2015-08-14: qty 1

## 2015-08-14 MED ORDER — FENTANYL CITRATE (PF) 250 MCG/5ML IJ SOLN
INTRAMUSCULAR | Status: AC
  Filled 2015-08-14: qty 5

## 2015-08-14 MED ORDER — LIDOCAINE HCL (CARDIAC) 20 MG/ML IV SOLN: INTRAVENOUS | Status: DC | PRN

## 2015-08-14 MED ORDER — MENTHOL 3 MG MT LOZG: 1.0000 | LOZENGE | OROMUCOSAL | Status: DC | PRN

## 2015-08-14 MED ORDER — OXYCODONE HCL 5 MG PO TABS: 5.0000 mg | ORAL_TABLET | Freq: Once | ORAL | Status: DC | PRN

## 2015-08-14 SURGICAL SUPPLY — 40 items
BAG SPEC THK2 15X12 ZIP CLS (MISCELLANEOUS) ×1
BAG ZIPLOCK 12X15 (MISCELLANEOUS) ×3 IMPLANT
BIT DRILL SHORT 4.0 (BIT) IMPLANT
BNDG GAUZE ELAST 4 BULKY (GAUZE/BANDAGES/DRESSINGS) ×3 IMPLANT
COVER PERINEAL POST (MISCELLANEOUS) ×3 IMPLANT
DRAPE STERI IOBAN 125X83 (DRAPES) ×3 IMPLANT
DRILL BIT SHORT 4.0 (BIT) ×3
DRSG MEPILEX BORDER 4X4 (GAUZE/BANDAGES/DRESSINGS) ×2 IMPLANT
DRSG PAD ABDOMINAL 8X10 ST (GAUZE/BANDAGES/DRESSINGS) ×3 IMPLANT
DRSG TEGADERM 4X4.75 (GAUZE/BANDAGES/DRESSINGS) IMPLANT
DURAPREP 26ML APPLICATOR (WOUND CARE) ×3 IMPLANT
ELECT REM PT RETURN 9FT ADLT (ELECTROSURGICAL) ×3
ELECTRODE REM PT RTRN 9FT ADLT (ELECTROSURGICAL) ×1 IMPLANT
FACESHIELD WRAPAROUND (MASK) IMPLANT
FACESHIELD WRAPAROUND OR TEAM (MASK) IMPLANT
GAUZE SPONGE 4X4 12PLY STRL (GAUZE/BANDAGES/DRESSINGS) ×3 IMPLANT
GAUZE XEROFORM 1X8 LF (GAUZE/BANDAGES/DRESSINGS) ×2 IMPLANT
GAUZE XEROFORM 5X9 LF (GAUZE/BANDAGES/DRESSINGS) ×3 IMPLANT
GLOVE BIO SURGEON STRL SZ7.5 (GLOVE) ×3 IMPLANT
GLOVE BIOGEL PI IND STRL 8 (GLOVE) ×1 IMPLANT
GLOVE BIOGEL PI INDICATOR 8 (GLOVE) ×2
GLOVE ECLIPSE 8.0 STRL XLNG CF (GLOVE) ×3 IMPLANT
GOWN STRL REUS W/TWL XL LVL3 (GOWN DISPOSABLE) ×3 IMPLANT
KIT BASIN OR (CUSTOM PROCEDURE TRAY) ×2 IMPLANT
NS IRRIG 1000ML POUR BTL (IV SOLUTION) ×3 IMPLANT
PACK GENERAL/GYN (CUSTOM PROCEDURE TRAY) ×3 IMPLANT
PAD CAST 4YDX4 CTTN HI CHSV (CAST SUPPLIES) ×1 IMPLANT
PADDING CAST COTTON 4X4 STRL (CAST SUPPLIES) ×3
POSITIONER SURGICAL ARM (MISCELLANEOUS) ×3 IMPLANT
SCREW TRIGEN LOW PROF 5.0X37.5 (Screw) ×2 IMPLANT
SPONGE LAP 18X18 X RAY DECT (DISPOSABLE) IMPLANT
STAPLER VISISTAT 35W (STAPLE) ×3 IMPLANT
SUT VIC AB 0 CT1 27 (SUTURE) ×3
SUT VIC AB 0 CT1 27XBRD ANTBC (SUTURE) ×1 IMPLANT
SUT VIC AB 1 CT1 27 (SUTURE) ×6
SUT VIC AB 1 CT1 27XBRD ANTBC (SUTURE) ×2 IMPLANT
SUT VIC AB 2-0 CT1 27 (SUTURE) ×3
SUT VIC AB 2-0 CT1 TAPERPNT 27 (SUTURE) ×1 IMPLANT
TOWEL OR 17X26 10 PK STRL BLUE (TOWEL DISPOSABLE) ×6 IMPLANT
WATER STERILE IRR 1500ML POUR (IV SOLUTION) ×3 IMPLANT

## 2015-08-14 NOTE — Anesthesia Procedure Notes (Signed)
Procedure Name: LMA Insertion Date/Time: 08/14/2015 5:24 PM Performed by: Edison PaceGRAY, Vivyan Biggers E Pre-anesthesia Checklist: Patient identified, Timeout performed, Emergency Drugs available, Suction available and Patient being monitored Patient Re-evaluated:Patient Re-evaluated prior to inductionOxygen Delivery Method: Circle system utilized Preoxygenation: Pre-oxygenation with 100% oxygen Intubation Type: IV induction LMA: LMA inserted LMA Size: 4.0 Number of attempts: 1 Placement Confirmation: positive ETCO2 Tube secured with: Tape Dental Injury: Teeth and Oropharynx as per pre-operative assessment

## 2015-08-14 NOTE — Transfer of Care (Signed)
Immediate Anesthesia Transfer of Care Note  Patient: [name]  Procedure(s) Performed: Procedure(s): PLACEMENT OF LEFT DISTAL INTERLOCKING SCREW FEMUR (Left)  Patient Location: PACU  Anesthesia Type:General  Level of Consciousness: awake, pateint uncooperative, confused, lethargic and responds to stimulation  Airway & Oxygen Therapy: Patient Spontanous Breathing and Patient connected to face mask oxygen  Post-op Assessment: Report given to RN, Post -op Vital signs reviewed and stable and Patient moving all extremities  Post vital signs: Reviewed and stable  Last Vitals:  Filed Vitals:   08/14/15 1400 08/14/15 1611  BP: 135/67 137/74  Pulse: 77 70  Temp: 36.9 C 36.8 C  Resp: 16 16    Complications: No apparent anesthesia complications

## 2015-08-14 NOTE — Progress Notes (Addendum)
Patient ID: [name], female   DOB: April 28, 1921, 82 y.o.   MRN: [id number] [facility] PROGRESS NOTE  CONSETTA COSNER WUJ:[id number] DOB: [birth year] DOA: 08/13/2015 PCP: [name], MD  Brief narrative:    82 year old female with history of hypothyroidism,  mild-to-moderate dementia, recently hospitalized at [facility] from 2/5-2/9 for left intertrochanteric hip fracture status post IM nailing and discharged to [facility] skilled nursing facility. She presented to ED again this time status post fall. X-ray done at the facility showed new oblique fracture of the left intertrochanteric femur with 6 mm medial displacement. Orthopedic surgery has seen her in consultation.   Assessment/Plan:    Principal Problem: Intertrochanteric fracture of left femur (HCC) - Secondary to Mechanical fall.  - Per ortho, surgery later on today  Active Problems:  Hypothyroidism - Continue Synthroid.   CKD (chronic kidney disease) stage 3, GFR 30-59 ml/min - Cr about 3 weeks ago was 1.77 - Cr on this admission 1.85 and today 1.75, at baseline     Anemia of chronic renal failure - Hemoglobin 9.2 - Monitor CBC daily    Dementia without behavioral disturbance - Stable    DVT Prophylaxis  - Lovenox subQ   Code Status: DNR/DNI Family Communication:  plan of care discussed with the patient's son at the bedside  Disposition Plan: likely by Monday 08/17/2015  IV access:  Peripheral IV  Procedures and diagnostic studies:     Chest Portable 1 View 08/14/2015  Medial right base atelectatic change. Lungs elsewhere clear. No change in cardiac silhouette. Electronically Signed   By: Bretta Bang III M.D.   On: 08/14/2015 07:22   Dg Hip Unilat With Pelvis 2-3 Views Left 08/13/2015  New oblique fracture of the infratrochanteric left femur with 6 mm medial displacement. Electronically Signed   By: Harmon Pier M.D.   On: 08/13/2015 15:52  ral intertrochanteric fracture. Electronically Signed   By:  Roanna Raider M.D.   On: 07/19/2015 23:23   Medical Consultants:  Dr. Magnus Ivan, Orthopedic surgery   Other Consultants:  PT  IAnti-Infectives:   None    Manson Passey, MD  [facility] Pager [phone]  Time spent in minutes: 25 minutes  If 7PM-7AM, please contact night-coverage [email or web address] Password [facility] 08/14/2015, 3:46 PM   LOS: 1 day    HPI/Subjective: No acute overnight events.   Objective: Filed Vitals:   08/13/15 2219 08/13/15 2341 08/14/15 0432 08/14/15 1400  BP: 90/58 131/55 126/64 135/67  Pulse: 63 70 58 77  Temp: 97.5 F (36.4 C) 99.1 F (37.3 C) 98.6 F (37 C) 98.5 F (36.9 C)  TempSrc:  Axillary Axillary   Resp: Height:      Weight:      SpO2: 100% 100% 96% 98%    Intake/Output Summary (Last 24 hours) at 08/14/15 1546 Last data filed at 08/14/15 0435  Gross per 24 hour  Intake      0 ml  Output    650 ml  Net   -650 ml    Exam:   General:  Pt is not in acute distress  Cardiovascular: Regular rate and rhythm, S1/S2 appreciated   Respiratory: Clear to auscultation bilaterally, no wheezing, no crackles, no rhonchi  Abdomen: Soft, non tender, non distended, bowel sounds present  Extremities: No swelling, pulses palpable bilaterally  Neuro: Grossly nonfocal  Data Reviewed: Basic Metabolic Panel:  Recent Labs Lab 08/10/15 0630 08/13/15 1930 08/14/15 0017  NA 144 137  --  K 4.7 4.9  --   CL 110 107  --   CO2 27 24  --   GLUCOSE 109* 127*  --   BUN 40* 48*  --   CREATININE 1.46* 1.85* 1.74*  CALCIUM 8.9 8.4*  --    Liver Function Tests: No results for input(s): AST, ALT, ALKPHOS, BILITOT, PROT, ALBUMIN in the last 168 hours. No results for input(s): LIPASE, AMYLASE in the last 168 hours. No results for input(s): AMMONIA in the last 168 hours. CBC:  Recent Labs Lab 08/13/15 1930 08/14/15 0215  WBC 9.9 10.5  NEUTROABS 6.5  --   HGB 10.3* 9.2*  HCT 32.4* 30.0*  MCV 93.4 95.5  PLT 365 321    Cardiac Enzymes: No results for input(s): CKTOTAL, CKMB, CKMBINDEX, TROPONINI in the last 168 hours. BNP: Invalid input(s): POCBNP CBG: No results for input(s): GLUCAP in the last 168 hours.  Recent Results (from the past 240 hour(s))  C difficile quick scan w PCR reflex     Status: Abnormal   Collection Time: 08/14/15  6:08 AM  Result Value Ref Range Status   C Diff antigen POSITIVE (A) NEGATIVE Final   C Diff toxin NEGATIVE NEGATIVE Final   C Diff interpretation   Final    C. difficile present, but toxin not detected. This indicates colonization. In most cases, this does not require treatment. If patient has signs and symptoms consistent with colitis, consider treatment. Requires ENTERIC precautions.     Scheduled Meds: . enoxaparin (LOVENOX) injection  30 mg Subcutaneous QHS  . levothyroxine  125 mcg Oral QAC breakfast  . oseltamivir  30 mg Oral QHS   Continuous Infusions: . sodium chloride    . dextrose 5 % and 0.45% NaCl 50 mL/hr at 08/14/15 0101

## 2015-08-14 NOTE — Progress Notes (Signed)
Nutrition Follow-up  DOCUMENTATION CODES:   Not applicable  INTERVENTION:  -Ensure Enlive po BID, each supplement provides 350 kcal and 20 grams of protein with diet advancement -RD to continue to monitor for needs   NUTRITION DIAGNOSIS:   Increased nutrient needs related to wound healing as evidenced by estimated needs.  GOAL:   Patient will meet greater than or equal to 90% of their needs  MONITOR:   PO intake, Diet advancement, I & O's, Labs, Weight trends  REASON FOR ASSESSMENT:   Consult Hip fracture protocol  ASSESSMENT:   82 year old female with history of hypothyroidism, hypertension, mild-to-moderate dementia recently hospitalized at [facility] from 2/5-2/9 for left intertrochanteric hip fracture status post IM nailing and discharged to [facility] skilled nursing facility.  Spoke with pt's son at bedside. Pt is confused, has dementia. He stated pt eats very well. No weight loss. No chewing or swallowing problems. Nutrition-Focused physical exam completed. Findings are no fat depletion, no muscle depletion, and no edema.   Labs and Medications reviewed No further interventions warranted  Diet Order:  Diet NPO time specified  Skin:  Wound (see comment) (Stg I to sacrum.)  Last BM:  3/2  Height:   Ht Readings from Last 1 Encounters:  08/13/15 5\' 2"  (1.575 m)    Weight:   Wt Readings from Last 1 Encounters:  08/13/15 160 lb (72.576 kg)    Ideal Body Weight:  50 kg  BMI:  Body mass index is 29.26 kg/(m^2).  Estimated Nutritional Needs:   Kcal:  1600-1900  Protein:  65-80 grams  Fluid:  >/= 1.5L/day  EDUCATION NEEDS:   No education needs identified at this time  Dionne AnoWilliam M. Boleslaus Holloway, MS, RD LDN After Hours/Weekend Pager [phone]

## 2015-08-14 NOTE — Clinical Social Work Note (Signed)
Clinical Social Work Assessment  Patient Details  Name: [name] MRN: [id number] Date of Birth: 08/27/1920  Date of referral:  08/14/15               Reason for consult:  Discharge Planning, Facility Placement                Permission sought to share information with:  Facility Art therapist granted to share information::  Yes, Verbal Permission Granted  Name::        Agency::     Relationship::     Contact Information:     Housing/Transportation Living arrangements for the past 2 months:  [place] of Information:  Adult Children Patient Interpreter Needed:  None Criminal Activity/Legal Involvement Pertinent to Current Situation/Hospitalization:  No - Comment as needed Significant Relationships:  Adult Children Lives with:  Facility Resident, Adult Children Do you feel safe going back to the place where you live?  Yes Need for family participation in patient care:  Yes (Comment)  Care giving concerns:  No concerns reported by pt's son.   Social Worker assessment / plan:  Pt hospitalized on 08/13/15 from [facility] with a fx left femur. Surgery is planned for today. Pt is disoriented x4 . CSW met with pt's son to assist with d/c planning. [name] would like his mother to return to [facility] at d/c. SNF has been contacted and clinicals sent for review. [facility] will readmit pt when stable for d/c. CSW will continue to follow to assist with d/c planning to SNF.  Employment status:  Retired Forensic scientist:  Medicare PT Recommendations:  Not assessed at this time Information / Referral to community resources:     Patient/Family's Response to care: Son would like pt to return to [place] at d/c.  Patient/Family's Understanding of and Emotional Response to Diagnosis, Current Treatment, and Prognosis:  [name] is aware of pt's medical status and pending surgery. " The fall was an unfortunate accident. " Support provided. Emotional  Assessment Appearance:  Appears stated age Attitude/Demeanor/Rapport:  Unable to Assess (cooperative) Affect (typically observed):  Unable to Assess Orientation:   (disoriented x 4) Alcohol / Substance use:  Not Applicable Psych involvement (Current and /or in the community):  No (Comment)  Discharge Needs  Concerns to be addressed:  Discharge Planning Concerns Readmission within the last 30 days:  Yes Current discharge risk:  None Barriers to Discharge:  No Barriers Identified   [name], [place] 08/14/2015, 2:17 PM

## 2015-08-14 NOTE — Care Management Note (Signed)
Case Management Note  Patient Details  Name: [name] MRN: [id number] Date of Birth: 1920-07-20  Subjective/Objective:   S/p mechanica fall and has a new subtrochanteric fracture, plan for surgical repair                Action/Plan: Discharge planning per CSW  Expected Discharge Date:                  Expected Discharge Plan:  Skilled Nursing Facility  In-House Referral:  Clinical Social Work  Discharge planning Services  CM Consult  Post Acute Care Choice:  NA Choice offered to:  NA  DME Arranged:  N/A DME Agency:  NA  HH Arranged:  NA HH Agency:  NA  Status of Service:  Completed, signed off  Medicare Important Message Given:    Date Medicare IM Given:    Medicare IM give by:    Date Additional Medicare IM Given:    Additional Medicare Important Message give by:     If discussed at Long Length of Stay Meetings, dates discussed:    Additional Comments:  Alexis Goodelleele, Larayah Clute K, RN 08/14/2015, 12:17 PM [phone]

## 2015-08-14 NOTE — Progress Notes (Signed)
Pt transported down to holding area OR for surgery with technician. Family with patient during transfer. Pt alert and in NAD.

## 2015-08-14 NOTE — Brief Op Note (Signed)
08/13/2015 - 08/14/2015  6:14 PM  PATIENT:  [name]  82 y.o. female  PRE-OPERATIVE DIAGNOSIS:  fractured left hip  POST-OPERATIVE DIAGNOSIS:  fractured left hip  PROCEDURE:  Procedure(s): PLACEMENT OF LEFT DISTAL INTERLOCKING SCREW FEMUR (Left)  SURGEON:  Surgeon(s) and Role:    * Kathryne Hitchhristopher Y Blackman, MD - Primary  ANESTHESIA:   general  EBL:  Total I/O In: -  Out: 75 [Blood:75]  DICTATION: .Other Dictation: Dictation Number [id number]  PLAN OF CARE: Admit to inpatient   PATIENT DISPOSITION:  PACU - hemodynamically stable.   Delay start of Pharmacological VTE agent (>24hrs) due to surgical blood loss or risk of bleeding: no

## 2015-08-14 NOTE — Op Note (Signed)
[name]:  [name] [name]                   ACCOUNT NO.:  [id number]  MEDICAL RECORD NO.:  [id number]  LOCATION:  1602                         FACILITY[facility]  PHYSICIAN:  Vanita PandaChristopher Y. Magnus IvanBlackman, M.D.DATE OF BIRTH:  July 25, 1920  DATE OF PROCEDURE:  08/14/2015 DATE OF DISCHARGE:                              OPERATIVE REPORT   PREOPERATIVE DIAGNOSIS:  Left subtrochanteric femur fracture, status post mechanical fall, status post open reduction and internal fixation of left intertrochanteric hip fracture using a long intramedullary nail and proximal hip screw.  POSTOPERATIVE DIAGNOSIS:  Left subtrochanteric femur fracture, status post mechanical fall, status post open reduction and internal fixation of left intertrochanteric hip fracture using a long intramedullary nail and proximal hip screw.  PROCEDURE:  Placement of distal interlocking screw through long intramedullary nail.  IMPLANTS:  [employer] 37.5-mm in length distal interlocking screw.  SURGEON:  Vanita PandaChristopher Y. Magnus IvanBlackman, M.D.  ANESTHESIA:  General.  BLOOD LOSS:  Less than 50 mL.  ANTIBIOTICS:  2 g of IV Ancef.  COMPLICATIONS:  None.  INDICATIONS:  [name] [name] is a 82 year old female, who has a history of a left intertrochanteric hip fracture in early February of this year.  She was taken to the operating room just about a month ago and underwent open reduction and internal fixation of her left intertrochanteric hip fracture.  This was done with a long intramedullary nail and then a hip screw construct and this was a stable fracture.  She had actually done well and I saw her in the office last week.  She does have a considerable fall risk and has some dementia.  She is at her skilled nursing facility and sustained a mechanical fall and had then difficulty ambulating and reporting left hip pain.  New x-rays were obtained and showed that her intertrochanteric fracture was in good alignment, but now she had a new  subtrochanteric fracture component.  With that being said, it is fortunate she has long intramedullary nail in place and the best way to secure this from a fracture standpoint would be to take her to the operating room, put some traction off the leg and then placed a distal interlocking screw to help with the rotation and stability.  I explained this to her son, who is her healthcare power of attorney and he did wish for her to proceed to surgery and amount of pain that she was having.  PROCEDURE DESCRIPTION:  After informed consent was obtained, appropriate left hip and left leg was marked.  She was brought to the operating room.  General anesthesia was obtained while she was on her stretcher. Next, she was placed supine on the fracture table with her left leg in inline skeletal traction, her right hip in abduction stirrup with appropriate padding.  A perineal post in place.  We then were able to assess the fracture radiographically and put little more traction and get it better reduced and locked this down.  We then prepped her left knee area and distal thigh with DuraPrep and sterile drapes.  A time-out was called and she was identified as correct patient and correct left knee.  Under direct fluoroscopic guidance, we  were able to make a small incision and then placed a single distal interlocking screw from lateral to medial to secure the intramedullary nail.  We then irrigated the wound with normal saline solution and closed the deep tissue with 2-0 Vicryl suture followed by interrupted staples on the skin.  Xeroform and well-padded sterile dressing was applied.  She was then taken off the fracture table, awakened, extubated and taken to the recovery room in stable condition.  All final counts were correct.  There were no complications noted.     Vanita Panda. Magnus Ivan, M.D.     CYB/MEDQ  D:  08/14/2015  T:  08/14/2015  Job:  [id number]

## 2015-08-14 NOTE — Anesthesia Preprocedure Evaluation (Addendum)
Anesthesia Evaluation  Patient identified by MRN, date of birth, ID band Patient confused    Reviewed: Allergy & Precautions, NPO status , Patient's Chart, lab work & pertinent test results, Unable to perform ROS - Chart review only  Airway Mallampati: I  TM Distance: >3 FB Neck ROM: Full    Dental  (+) Teeth Intact, Dental Advisory Given   Pulmonary    breath sounds clear to auscultation       Cardiovascular hypertension,  Rhythm:Regular Rate:Normal     Neuro/Psych    GI/Hepatic hiatal hernia,   Endo/Other    Renal/GU      Musculoskeletal   Abdominal   Peds  Hematology   Anesthesia Other Findings   Reproductive/Obstetrics                           Anesthesia Physical Anesthesia Plan  ASA: III  Anesthesia Plan: General   Post-op Pain Management:    Induction: Intravenous  Airway Management Planned: LMA  Additional Equipment:   Intra-op Plan:   Post-operative Plan: Extubation in OR  Informed Consent: I have reviewed the patients History and Physical, chart, labs and discussed the procedure including the risks, benefits and alternatives for the proposed anesthesia with the patient or authorized representative who has indicated his/her understanding and acceptance.   Dental advisory given  Plan Discussed with: CRNA, Anesthesiologist and Surgeon  Anesthesia Plan Comments:         Anesthesia Quick Evaluation

## 2015-08-14 NOTE — Progress Notes (Signed)
Patient ID: [name], female   DOB: 1920/12/30, 82 y.o.   MRN: [id number] The patient is well known to me.  She is demented and is in a nursing facility.  Her son is at the bedside.  She had surgery for a left hip intertrochanteric fracture 07/20/15 and has a long IM rod.  She now has had a new mechanica fall and has a new subtrochanteric fracture.  She is exhibiting significant pain.  I have recommended proceeding to surgery this evening for placement of distal interlocking screws in the femoral rod/nail to better stabilize her new fracture.

## 2015-08-14 NOTE — Progress Notes (Signed)
Unable to give pt her tamiflu. Pt clenches her teeth and closes mouth when tried to give her medications to her.  Notified the on-call PA and she said it was okay to not to give dose. Will continue to monitor.

## 2015-08-14 NOTE — NC FL2 (Signed)
[place] MEDICAID FL2 LEVEL OF CARE SCREENING TOOL     IDENTIFICATION  Patient Name: [name] Birthdate: [birth year] Sex: female Admission Date (Current Location): 08/13/2015  [employer] and [place] Number:  Aaron Edelman [id number] A Facility and Address:  [facility],  [address] [place] [address]      Provider Number: [id number]  Attending Physician Name and Address:  Eddie North, MD  Relative Name and Phone Number:       Current Level of Care: Hospital Recommended Level of Care: Skilled Nursing Facility Prior Approval Number:    Date Approved/Denied:   PASRR Number: [id number]  Discharge Plan: SNF    Current Diagnoses: Patient Active Problem List   Diagnosis Date Noted  . Intertrochanteric fracture of left femur (HCC) 08/13/2015  . Left displaced femoral neck fracture (HCC) 08/13/2015  . Hip fracture, left (HCC) 07/20/2015  . Hyperkalemia 07/20/2015  . CKD (chronic kidney disease) stage 3, GFR 30-59 ml/min 07/20/2015  . Hip fracture requiring operative repair (HCC) 07/20/2015  . Closed nondisplaced intertrochanteric fracture of left femur (HCC)   . Femoral neck fracture (HCC) 07/04/2014  . Fracture of femoral neck (HCC)   . Hip fracture (HCC) 07/03/2014  . Right ureteral calculus 06/26/2014  . Acute renal failure (HCC) 05/20/2014  . Ureteral stone with hydronephrosis 05/20/2014  . Hyperglycemia 05/20/2014  . Renal atrophy 05/20/2014  . Leukocytosis 05/20/2014  . Urinary tract infection with pyuria 05/20/2014  . Hypertension   . UTI (urinary tract infection) 07/17/2013  . UTI (lower urinary tract infection) 11/09/2012  . Fall 11/09/2012  . Delirium superimposed on dementia 11/09/2012  . Dysphagia 06/19/2012  . Esophageal dysphagia 09/06/2011  . Odynophagia 09/06/2011  . Hoarseness 09/06/2011  . Abdominal pain, generalized 10/21/2008  . Hypothyroidism 10/10/2008  . Personal history of other diseases of digestive system 10/10/2008     Orientation RESPIRATION BLADDER Height & Weight      (disoriented x 4)    Indwelling catheter Weight: 72.576 kg (160 lb) Height:   (157.5 cm)  BEHAVIORAL SYMPTOMS/MOOD NEUROLOGICAL BOWEL NUTRITION STATUS      Incontinent Diet  AMBULATORY STATUS COMMUNICATION OF NEEDS Skin   Extensive Assist Verbally Other (Comment) (stage 1 on sacrum)                       Personal Care Assistance Level of Assistance  Bathing, Feeding, Dressing Bathing Assistance: Maximum assistance Feeding assistance: Limited assistance Dressing Assistance: Maximum assistance     Functional Limitations Info  Sight, Speech, Hearing Sight Info: Adequate Hearing Info: Adequate Speech Info: Adequate    SPECIAL CARE FACTORS FREQUENCY  PT (By licensed PT), OT (By licensed OT)     PT Frequency: 5 x wk OT Frequency: 5 x wk            Contractures Contractures Info: Not present    Additional Factors Info  Psychotropic Code Status Info: DNR             Current Medications (08/14/2015):  This is the current hospital active medication list Current Facility-Administered Medications  Medication Dose Route Frequency Provider Last Rate Last Dose  . 0.9 %  sodium chloride infusion   Intravenous Continuous Samuel Jester, DO      . dextrose 5 %-0.45 % sodium chloride infusion   Intravenous Continuous Nishant Dhungel, MD 50 mL/hr at 08/14/15 0101    . enoxaparin (LOVENOX) injection 30 mg  30 mg Subcutaneous QHS Nishant Dhungel, MD   30  mg at 08/14/15 0100  . levothyroxine (SYNTHROID, LEVOTHROID) tablet 125 mcg  125 mcg Oral QAC breakfast Mallie DartingLaura A Harduk, PA-C   125 mcg at 08/14/15 [id number]  . morphine 2 MG/ML injection 0.5 mg  0.5 mg Intravenous Q3H PRN Nishant Dhungel, MD      . oseltamivir (TAMIFLU) capsule 30 mg  30 mg Oral QHS Vernona RiegerLaura A Harduk, PA-C   30 mg at 08/14/15 [id number]     Discharge Medications: Please see discharge summary for a list of discharge medications.  Relevant Imaging  Results:  Relevant Lab Results:   Additional Information ss # [id number], Enteric precautions, Droplet and Contact precautions 08/14/15  Rainn Zupko, Dickey GaveJamie Lee, LCSW

## 2015-08-15 DIAGNOSIS — D631 Anemia in chronic kidney disease: Secondary | ICD-10-CM

## 2015-08-15 DIAGNOSIS — S72142S Displaced intertrochanteric fracture of left femur, sequela: Secondary | ICD-10-CM

## 2015-08-15 LAB — CBC
HCT: 28.6 % — ABNORMAL LOW (ref 36.0–46.0)
HEMOGLOBIN: 8.7 g/dL — AB (ref 12.0–15.0)
MCH: 29.1 pg (ref 26.0–34.0)
MCHC: 30.4 g/dL (ref 30.0–36.0)
MCV: 95.7 fL (ref 78.0–100.0)
PLATELETS: 337 10*3/uL (ref 150–400)
RBC: 2.99 MIL/uL — AB (ref 3.87–5.11)
RDW: 15 % (ref 11.5–15.5)
WBC: 9.9 10*3/uL (ref 4.0–10.5)

## 2015-08-15 LAB — BASIC METABOLIC PANEL
ANION GAP: 8 (ref 5–15)
BUN: 40 mg/dL — ABNORMAL HIGH (ref 6–20)
CALCIUM: 8.7 mg/dL — AB (ref 8.9–10.3)
CO2: 22 mmol/L (ref 22–32)
Chloride: 113 mmol/L — ABNORMAL HIGH (ref 101–111)
Creatinine, Ser: 1.3 mg/dL — ABNORMAL HIGH (ref 0.44–1.00)
GFR, EST AFRICAN AMERICAN: 40 mL/min — AB (ref >60)
GFR, EST NON AFRICAN AMERICAN: 35 mL/min — AB (ref >60)
Glucose, Bld: 146 mg/dL — ABNORMAL HIGH (ref 65–99)
POTASSIUM: 5.5 mmol/L — AB (ref 3.5–5.1)
Sodium: 143 mmol/L (ref 135–145)

## 2015-08-15 MED ORDER — FUROSEMIDE 10 MG/ML IJ SOLN
20.0000 mg | Freq: Once | INTRAMUSCULAR | Status: AC
  Administered 2015-08-15: 20 mg via INTRAVENOUS
  Filled 2015-08-15: qty 2

## 2015-08-15 NOTE — Anesthesia Postprocedure Evaluation (Signed)
Anesthesia Post Note  Patient: [name]  Procedure(s) Performed: Procedure(s) (LRB): PLACEMENT OF LEFT DISTAL INTERLOCKING SCREW FEMUR (Left)  Patient location during evaluation: PACU Anesthesia Type: General Level of consciousness: awake, confused and responds to stimulation Pain management: pain level controlled Vital Signs Assessment: post-procedure vital signs reviewed and stable Respiratory status: spontaneous breathing, nonlabored ventilation, respiratory function stable and patient connected to nasal cannula oxygen Cardiovascular status: blood pressure returned to baseline and stable Postop Assessment: no signs of nausea or vomiting Anesthetic complications: no Comments: Pt in PACU with same condition of memory impairment and dementia as pre op. Breathing well.    Last Vitals:  Filed Vitals:   08/14/15 2142 08/14/15 2216  BP: 137/58 111/69  Pulse: 59 60  Temp: 36.7 C 36.7 C  Resp: 14     Last Pain:  Filed Vitals:   08/14/15 2221  PainSc: Asleep                 Kaz[place]o Clemence A

## 2015-08-15 NOTE — Progress Notes (Addendum)
Patient ID: [name], female   DOB: [birth year], 82 y.o.   MRN: [id number] [facility] PROGRESS NOTE  [name] MaeMary E Hollander OZH:[id number] DOB: [birth year] DOA: 08/13/2015 PCP: Alice ReichertMCINNIS,ANGUS G, MD  Brief narrative:    82 year old female with history of hypothyroidism,  mild-to-moderate dementia, recently hospitalized at [facility] from 2/5-2/9 for left intertrochanteric hip fracture status post IM nailing and discharged to [facility] skilled nursing facility. She presented to ED again this time status post fall. X-ray done at the facility showed new oblique fracture of the left intertrochanteric femur with 6 mm medial displacement. Orthopedic surgery has seen her in consultation. She is s/p ORIF 3/3.  Assessment/Plan:    Principal Problem: Intertrochanteric fracture of left femur (HCC) - Secondary to mechanical fall.  - S/P ORIF 3/3, no subsequent complications - to SNF likely by Monday   Active Problems:   Hyperkalemia - Will give 1 dose lasix 20 mg IV - BP stable - Repeat BMP in am   Hypothyroidism - Continue Synthroid.   CKD (chronic kidney disease) stage 3, GFR 30-59 ml/min - Cr about 3 weeks ago was 1.77 - Cr on this admission 1.85 and further improving to 1.3    Anemia of chronic renal failure - Hemoglobin 9.2 --> 8.7    Dementia without behavioral disturbance - Stable    DVT Prophylaxis  - Aspirin ordered   Code Status: DNR/DNI Family Communication:  plan of care discussed with the patient's son at the bedside  Disposition Plan: To SNF likely by Monday 08/17/2015  IV access:  Peripheral IV  Procedures and diagnostic studies:     Chest Portable 1 View 08/14/2015  Medial right base atelectatic change. Lungs elsewhere clear. No change in cardiac silhouette. Electronically Signed   By: Bretta BangWilliam  Woodruff III M.D.   On: 08/14/2015 07:22   Dg Hip Unilat With Pelvis 2-3 Views Left 08/13/2015  New oblique fracture of the infratrochanteric left femur with 6 mm medial  displacement. Electronically Signed   By: Harmon PierJeffrey  Hu M.D.   On: 08/13/2015 15:52  ral intertrochanteric fracture. Electronically Signed   By: Roanna RaiderJeffery  Chang M.D.   On: 07/19/2015 23:23   Medical Consultants:  Dr. Magnus IvanBlackman, Orthopedic surgery   Other Consultants:  PT  IAnti-Infectives:   None    Manson PasseyEVINE, Lian Tanori, MD  [facility] Pager [phone]  Time spent in minutes: 25 minutes  If 7PM-7AM, please contact night-coverage [email or web address] Password [email or web address] 08/15/2015, 4:47 PM   LOS: 2 days    HPI/Subjective: No acute overnight events.   Objective: Filed Vitals:   08/15/15 0613 08/15/15 0918 08/15/15 1355 08/15/15 1358  BP: 111/69 122/47 150/51   Pulse: 53 87 57   Temp: 98.6 F (37 C) 98.2 F (36.8 C) 98.2 F (36.8 C)   TempSrc: Axillary Axillary    Resp: 14 14 16    Height:      Weight:      SpO2: 97% 94% 89% 95%    Intake/Output Summary (Last 24 hours) at 08/15/15 1647 Last data filed at 08/15/15 1423  Gross per 24 hour  Intake   2534 ml  Output    900 ml  Net   1634 ml    Exam:   General:  Pt is not in distress  Cardiovascular: RRR, S1/S2 (+)  Respiratory: No wheezing, no crackles, no rhonchi  Abdomen: (+) BS, non tender   Extremities: Palpable pulses   Neuro: Nonfocal  Data Reviewed: Basic Metabolic Panel:  Recent Labs Lab 08/10/15 0630  08/13/15 1930 08/14/15 0017 08/15/15 0458  NA 144 137  --  143  K 4.7 4.9  --  5.5*  CL 110 107  --  113*  CO2 27 24  --  22  GLUCOSE 109* 127*  --  146*  BUN 40* 48*  --  40*  CREATININE 1.46* 1.85* 1.74* 1.30*  CALCIUM 8.9 8.4*  --  8.7*   Liver Function Tests: No results for input(s): AST, ALT, ALKPHOS, BILITOT, PROT, ALBUMIN in the last 168 hours. No results for input(s): LIPASE, AMYLASE in the last 168 hours. No results for input(s): AMMONIA in the last 168 hours. CBC:  Recent Labs Lab 08/13/15 1930 08/14/15 0215 08/15/15 0458  WBC 9.9 10.5 9.9  NEUTROABS 6.5  --   --   HGB 10.3* 9.2*  8.7*  HCT 32.4* 30.0* 28.6*  MCV 93.4 95.5 95.7  PLT 365 321 337   Cardiac Enzymes: No results for input(s): CKTOTAL, CKMB, CKMBINDEX, TROPONINI in the last 168 hours. BNP: Invalid input(s): POCBNP CBG: No results for input(s): GLUCAP in the last 168 hours.  Recent Results (from the past 240 hour(s))  C difficile quick scan w PCR reflex     Status: Abnormal   Collection Time: 08/14/15  6:08 AM  Result Value Ref Range Status   C Diff antigen POSITIVE (A) NEGATIVE Final   C Diff toxin NEGATIVE NEGATIVE Final   C Diff interpretation   Final    C. difficile present, but toxin not detected. This indicates colonization. In most cases, this does not require treatment. If patient has signs and symptoms consistent with colitis, consider treatment. Requires ENTERIC precautions.     Scheduled Meds: . aspirin EC  325 mg Oral Q breakfast  . furosemide  20 mg Intravenous Once   Continuous Infusions: . sodium chloride

## 2015-08-15 NOTE — Evaluation (Signed)
Physical Therapy Evaluation Patient Details Name: [name] MRN: [id number] DOB: Sep 21, 1920 Today's Date: 08/15/2015   History of Present Illness  82 yo admitted s/p fall at SNF with L hip fx and distal interlocking screw placement.  Pt wtih hx of L hip fx and IM nailing 08/02/15.  Pt also with hx of dementia, vertebral compression fx, and peripheral neuropathy  Clinical Impression  Pt s/p second L hip fx and presents with functional mobility limitations 2* pain, generalized weakness, dementia related cognitive status and anxiety.  Pt would benefit from follow up rehab at SNF level.    Follow Up Recommendations SNF    Equipment Recommendations  None recommended by PT    Recommendations for Other Services       Precautions / Restrictions Precautions Precautions: Fall Restrictions Weight Bearing Restrictions: No LLE Weight Bearing: Weight bearing as tolerated      Mobility  Bed Mobility Overal bed mobility: Needs Assistance;+2 for physical assistance Bed Mobility: Supine to Sit;Sit to Supine     Supine to sit: Total assist;+2 for physical assistance Sit to supine: Total assist;+2 for physical assistance   General bed mobility comments: Pt able to pivot to EOB with use of pad and helicopter technique but even with legs off of bed was unable to maintain upright trunk despite total assist of 2 people.. Pt with significant posterior lean resisting sitting and yelling "back in my bed". Pt returned to  supine with pivot .   Transfers                 General transfer comment: NT 2* reaction with attempt to sit at [facility]  Ambulation/Gait                Stairs            Wheelchair Mobility    Modified Rankin (Stroke Patients Only)       Balance Overall balance assessment: Needs assistance Sitting-balance support: Bilateral upper extremity supported Sitting balance-Leahy Scale: Zero                                       Pertinent  Vitals/Pain Pain Assessment: Faces Faces Pain Scale: Hurts even more Pain Location: L LE with attempt to move to EOB Pain Intervention(s): Limited activity within patient's tolerance;Monitored during session;Ice applied    Home Living Family/patient expects to be discharged to:: Skilled nursing facility                      Prior Function Level of Independence: Needs assistance         Comments: Pt unable to provide information.  Pt was in SNF since previous hip fx last month     Hand Dominance        Extremity/Trunk Assessment   Upper Extremity Assessment: Generalized weakness           Lower Extremity Assessment: Generalized weakness;LLE deficits/detail   LLE Deficits / Details: Pt tolerated gentle AAROM at L hip to 80 flex and 20 abd  Cervical / Trunk Assessment: Kyphotic  Communication   Communication: HOH  Cognition Arousal/Alertness: Awake/alert Behavior During Therapy: Flat affect Overall Cognitive Status: History of cognitive impairments - at baseline                      General Comments      Exercises General Exercises -  Lower Extremity Ankle Circles/Pumps: AAROM;Both;10 reps;Supine Heel Slides: AAROM;Left;Supine;10 reps Hip ABduction/ADduction: AAROM;Supine;Left;10 reps      Assessment/Plan    PT Assessment Patient needs continued PT services  PT Diagnosis Difficulty walking;Generalized weakness   PT Problem List Decreased strength;Decreased range of motion;Decreased activity tolerance;Decreased balance;Decreased mobility;Decreased cognition;Decreased knowledge of use of DME;Decreased safety awareness;Pain  PT Treatment Interventions DME instruction;Gait training;Functional mobility training;Therapeutic activities;Therapeutic exercise;Balance training;Patient/family education   PT Goals (Current goals can be found in the Care Plan section) Acute Rehab PT Goals Patient Stated Goal: No specific goals stated PT Goal Formulation:  Patient unable to participate in goal setting Time For Goal Achievement: 08/22/15 Potential to Achieve Goals: Fair    Frequency Min 3X/week   Barriers to discharge        Co-evaluation               End of Session   Activity Tolerance: Treatment limited secondary to agitation Patient left: in bed;with call bell/phone within reach;with bed alarm set Nurse Communication: Mobility status;Need for lift equipment         Time: 0920-0938 PT Time Calculation (min) (ACUTE ONLY): 18 min   Charges:   PT Evaluation $PT Eval Low Complexity: 1 Procedure     PT G Codes:        Landrum Carbonell 08/15/2015, 3:53 PM

## 2015-08-15 NOTE — Progress Notes (Signed)
08/15/15 - Patient NPO for surgery on 08/14/15.  Advanced diet as tolerated per order.  When given full liquid diet, patient had a lot of coughing and throat gurgling after eating.  Patient was able to cough and clear throat some, but not completely.  Did not see any swallowing issues noted in chart.  Patient may require swallow eval.

## 2015-08-15 NOTE — Progress Notes (Signed)
Patient ID: [name], female   DOB: May 19, 1921, 82 y.o.   MRN: [id number] Demented but follows commands.  Left knee dressing intact from placing a distal interlocking screw.  Acute blood loss anemia from her fracture.  No acute changes.  Can return to SNF when medically cleared.

## 2015-08-16 DIAGNOSIS — N390 Urinary tract infection, site not specified: Secondary | ICD-10-CM

## 2015-08-16 LAB — BASIC METABOLIC PANEL
Anion gap: 8 (ref 5–15)
BUN: 35 mg/dL — AB (ref 6–20)
CO2: 23 mmol/L (ref 22–32)
CREATININE: 1.36 mg/dL — AB (ref 0.44–1.00)
Calcium: 8.6 mg/dL — ABNORMAL LOW (ref 8.9–10.3)
Chloride: 112 mmol/L — ABNORMAL HIGH (ref 101–111)
GFR, EST AFRICAN AMERICAN: 38 mL/min — AB (ref >60)
GFR, EST NON AFRICAN AMERICAN: 33 mL/min — AB (ref >60)
Glucose, Bld: 104 mg/dL — ABNORMAL HIGH (ref 65–99)
POTASSIUM: 4.9 mmol/L (ref 3.5–5.1)
SODIUM: 143 mmol/L (ref 135–145)

## 2015-08-16 LAB — CBC
HCT: 28 % — ABNORMAL LOW (ref 36.0–46.0)
HEMOGLOBIN: 8.5 g/dL — AB (ref 12.0–15.0)
MCH: 29 pg (ref 26.0–34.0)
MCHC: 30.4 g/dL (ref 30.0–36.0)
MCV: 95.6 fL (ref 78.0–100.0)
PLATELETS: 339 10*3/uL (ref 150–400)
RBC: 2.93 MIL/uL — AB (ref 3.87–5.11)
RDW: 14.9 % (ref 11.5–15.5)
WBC: 8.1 10*3/uL (ref 4.0–10.5)

## 2015-08-16 MED ORDER — CIPROFLOXACIN HCL 500 MG PO TABS: 500.0000 mg | ORAL_TABLET | Freq: Every day | ORAL | Status: DC

## 2015-08-16 MED ORDER — ASPIRIN 325 MG PO TBEC: 325.0000 mg | DELAYED_RELEASE_TABLET | Freq: Every day | ORAL | Status: DC

## 2015-08-16 MED ORDER — CIPROFLOXACIN HCL 500 MG PO TABS
500.0000 mg | ORAL_TABLET | Freq: Every day | ORAL | Status: DC
  Administered 2015-08-16 – 2015-08-17 (×2): 500 mg via ORAL
  Filled 2015-08-16 (×3): qty 1

## 2015-08-16 MED ORDER — HYDROCODONE-ACETAMINOPHEN 5-325 MG PO TABS: 1.0000 | ORAL_TABLET | Freq: Four times a day (QID) | ORAL | Status: DC | PRN

## 2015-08-16 MED ORDER — CIPROFLOXACIN HCL 500 MG PO TABS: 500.0000 mg | ORAL_TABLET | Freq: Two times a day (BID) | ORAL | Status: DC

## 2015-08-16 NOTE — Discharge Summary (Addendum)
Physician Discharge Summary  [name] ZOX:[id number] DOB: [birth year] DOA: 08/13/2015  PCP: [name], MD  Admit date: 08/13/2015 Discharge date: 08/17/2015  Recommendations for Outpatient Follow-up:  1. Check CBC and BMP per SNF protocol  2. Please continue cipro for 5 days on discharge for UTI  Discharge Diagnoses:  Principal Problem:   Intertrochanteric fracture of left femur (HCC) Active Problems:   Hypothyroidism   Delirium superimposed on dementia   CKD (chronic kidney disease) stage 3, GFR 30-59 ml/min   Left displaced femoral neck fracture (HCC)    Discharge Condition: stable   Diet recommendation: as tolerated   History of present illness:  82 year old female with history of hypothyroidism, mild-to-moderate dementia, recently hospitalized at [facility] from 2/5-2/9 for left intertrochanteric hip fracture status post IM nailing and discharged to [facility] skilled nursing facility. She presented to ED again this time status post fall. X-ray done at the facility showed new oblique fracture of the left intertrochanteric femur with 6 mm medial displacement. Orthopedic surgery has seen her in consultation. She is s/p ORIF 3/3.  Hospital Course:   Assessment/Plan:    Principal Problem: Intertrochanteric fracture of left femur (HCC) - Secondary to mechanical fall.  - S/P ORIF 3/3, no subsequent complications - Aspirin for DVT prophylaxis per ortho   Active Problems:   UTI - Leukocytes and many bacteria on UA - Start Cipro 3/5 and will continue 3/6 and then for 5 days in SNF - Urine culture not obtained at the time of he admission   Hyperkalemia - Given 1 dose lasix 20 mg IV 3/4 - Potassium now WNL   Hypothyroidism - Continue Synthroid on discharge    CKD (chronic kidney disease) stage 3, GFR 30-59 ml/min - Cr about 3 weeks ago was 1.77 - Cr on this admission 1.85 and further improved to 1.3   Anemia of chronic renal failure - Hemoglobin 9.2 -->  8.7 --> 8.5   Dementia without behavioral disturbance - Stable    DVT Prophylaxis  - Aspirin per ortho on discharge   Code Status: DNR/DNI Family Communication: pt son not at the bedside this am   IV access:  Peripheral IV  Procedures and diagnostic studies:    Chest Portable 1 View 08/14/2015 Medial right base atelectatic change. Lungs elsewhere clear. No change in cardiac silhouette. Electronically Signed By: Bretta Bang III M.D. On: 08/14/2015 07:22   Dg Hip Unilat With Pelvis 2-3 Views Left 08/13/2015 New oblique fracture of the infratrochanteric left femur with 6 mm medial displacement. Electronically Signed By: Harmon Pier M.D. On: 08/13/2015 15:52  ral intertrochanteric fracture. Electronically Signed By: Roanna Raider M.D. On: 07/19/2015 23:23   Medical Consultants:  Dr. Magnus Ivan, Orthopedic surgery   Other Consultants:  PT IAnti-Infectives:   None   Signed:  Manson Passey, MD  [facility] 08/16/2015, 12:50 PM  Pager #: [phone]  Time spent in minutes: more than 30 minutes   Discharge Exam: Filed Vitals:   08/15/15 2255 08/16/15 0524  BP: 140/56 139/70  Pulse:  111  Temp:  99 F (37.2 C)  Resp:  15   Filed Vitals:   08/15/15 1358 08/15/15 2208 08/15/15 2255 08/16/15 0524  BP:  132/100 140/56 139/70  Pulse:  61  111  Temp:  97.5 F (36.4 C)  99 F (37.2 C)  TempSrc:  Axillary  Axillary  Resp:  14  15  Height:      Weight:      SpO2: 95% 98%  General: Pt is alert, not in acute distress Cardiovascular: Regular rate and rhythm, S1/S2 + Respiratory: Clear to auscultation bilaterally, no wheezing, no crackles, no rhonchi Abdominal: Soft, non tender, non distended, bowel sounds +, no guarding Extremities: no edema, no cyanosis, pulses palpable bilaterally DP and PT Neuro: Grossly nonfocal  Discharge Instructions  Discharge Instructions    Call MD for:  difficulty breathing, headache or visual  disturbances    Complete by:  As directed      Call MD for:  persistant dizziness or light-headedness    Complete by:  As directed      Call MD for:  persistant nausea and vomiting    Complete by:  As directed      Call MD for:  severe uncontrolled pain    Complete by:  As directed      Diet - low sodium heart healthy    Complete by:  As directed      Increase activity slowly    Complete by:  As directed             Medication List    STOP taking these medications        loratadine 10 MG tablet  Commonly known as:  CLARITIN     oseltamivir 30 MG capsule  Commonly known as:  TAMIFLU     tamsulosin 0.4 MG Caps capsule  Commonly known as:  FLOMAX     traMADol 50 MG tablet  Commonly known as:  ULTRAM      TAKE these medications        aspirin 325 MG EC tablet  Take 1 tablet (325 mg total) by mouth daily with breakfast.     famotidine 20 MG tablet  Commonly known as:  PEPCID  Take 1 tablet (20 mg total) by mouth 2 (two) times daily.     ferrous sulfate 325 (65 FE) MG tablet  Take 325 mg by mouth daily with breakfast.     gabapentin 300 MG capsule  Commonly known as:  NEURONTIN  Take 1 capsule by mouth 3 (three) times daily.     HYDROcodone-acetaminophen 5-325 MG tablet  Commonly known as:  NORCO/VICODIN  Take 1-2 tablets by mouth every 6 (six) hours as needed for moderate pain.     ipratropium-albuterol 0.5-2.5 (3) MG/3ML Soln  Commonly known as:  DUONEB  Take 3 mLs by nebulization every 6 (six) hours as needed (Shortness of breath). Reported on 07/19/2015     levothyroxine 125 MCG tablet  Commonly known as:  SYNTHROID, LEVOTHROID  Take 125 mcg by mouth daily before breakfast.     PAIN RELIEVER 325 MG tablet  Generic drug:  acetaminophen  Take 650 mg by mouth every 6 (six) hours as needed for mild pain or moderate pain.     senna-docusate 8.6-50 MG tablet  Commonly known as:  Senokot-S  Take 1 tablet by mouth at bedtime as needed for mild constipation.            Follow-up Information    Follow up with [name], MD. Schedule an appointment as soon as possible for a visit in 2 weeks.   Specialty:  Family Medicine   Why:  Follow up appt after recent hospitalization   Contact information:   [address] [place] [address] [phone]        The results of significant diagnostics from this hospitalization (including imaging, microbiology, ancillary and laboratory) are listed below for reference.    Significant  Diagnostic Studies: Dg Chest 1 View  07/19/2015  CLINICAL DATA:  82 year old female with fall and left hip pain. EXAM: CHEST 1 VIEW COMPARISON:  Chest radiograph dated 07/03/2014 FINDINGS: The patient is rotated. The lungs are clear. No pleural effusion or pneumothorax. Stable cardiomegaly. The bones are osteopenic. No acute fracture. IMPRESSION: No acute cardiopulmonary process. Electronically Signed   By: Elgie Collard M.D.   On: 07/19/2015 23:20   Dg Chest 2 View  07/27/2015  CLINICAL DATA:  Cough EXAM: CHEST  2 VIEW COMPARISON:  Chest x-ray of July 19, 2015 FINDINGS: The lungs are markedly hypoinflated. There is subsegmental atelectasis just above the left hemidiaphragm. There is no pleural effusion or pneumothorax. The cardiac silhouette is mildly enlarged but stable. The pulmonary vascularity is not engorged. There is prominent thoracic kyphosis. IMPRESSION: Bilateral pulmonary hypoinflation accentuated by kyphosis. Left basilar atelectasis is suspected. Stable cardiomegaly without CHF. Electronically Signed   By: David  Sw[place]and M.D.   On: 07/27/2015 14:10   Dg Knee 2 Views Left  07/19/2015  CLINICAL DATA:  Status post fall, with left hip pain. Initial encounter. EXAM: LEFT KNEE - 1-2 VIEW COMPARISON:  Left lower extremity CT performed 07/05/2003 FINDINGS: There is no evidence of fracture or dislocation. The joint spaces are preserved. Mild marginal osteophyte formation is noted at the medial and lateral  compartments. The patellofemoral compartment is grossly unremarkable. No significant joint effusion is seen. The visualized soft tissues are normal in appearance. IMPRESSION: No evidence of fracture or dislocation. Electronically Signed   By: Roanna Raider M.D.   On: 07/19/2015 23:20   Dg Knee 2 Views Right  07/19/2015  CLINICAL DATA:  Status post fall, with concern for right knee injury. Initial encounter. EXAM: RIGHT KNEE - 1-2 VIEW COMPARISON:  Right knee radiographs performed earlier today at 1:36 a.m. FINDINGS: There is no evidence of fracture or dislocation. There is narrowing of the medial compartment, with marginal osteophytes seen arising at all 3 compartments. A small knee joint effusion is suggested. The visualized soft tissues are normal in appearance. IMPRESSION: 1. No evidence of fracture or dislocation. 2. Mild tricompartmental osteoarthritis, with narrowing of the medial compartment. 3. Small knee joint effusion suggested. Electronically Signed   By: Roanna Raider M.D.   On: 07/19/2015 23:22   Ct Head Wo Contrast  07/19/2015  CLINICAL DATA:  Found in hall, lying on right side. Concern for head or cervical spine injury. Initial encounter. EXAM: CT HEAD WITHOUT CONTRAST CT CERVICAL SPINE WITHOUT CONTRAST TECHNIQUE: Multidetector CT imaging of the head and cervical spine was performed following the standard protocol without intravenous contrast. Multiplanar CT image reconstructions of the cervical spine were also generated. COMPARISON:  CT of the head and cervical spine performed 07/03/2014 FINDINGS: CT HEAD FINDINGS There is no evidence of acute infarction, mass lesion, or intra- or extra-axial hemorrhage on CT. Prominence of the ventricles and sulci reflects moderate cortical volume loss. Cerebellar atrophy is noted. Scattered periventricular and subcortical white matter change likely reflects small vessel ischemic microangiopathy. Chronic ischemic change is noted at the basal ganglia  bilaterally. The brainstem and fourth ventricle are within normal limits. The cerebral hemispheres demonstrate grossly normal gray-white differentiation. No mass effect or midline shift is seen. There is no evidence of fracture; visualized osseous structures are unremarkable in appearance. The visualized portions of the orbits are within normal limits. Mucosal thickening is noted at the right ethmoid air cells. The remaining paranasal sinuses and mastoid air cells are well-aerated. Mild soft  tissue swelling is noted overlying the left frontal calvarium. CT CERVICAL SPINE FINDINGS There is no evidence of fracture or subluxation. Vertebral bodies demonstrate normal height and alignment. Mild multilevel disc space narrowing is suggested along the cervical spine, with underlying facet disease noted. Prevertebral soft tissues are within normal limits. The thyroid gland is unremarkable in appearance. The visualized lung apices are clear. Mild calcification is noted at the carotid bifurcations bilaterally. IMPRESSION: 1. No evidence of traumatic intracranial injury or fracture. 2. No evidence of fracture or subluxation along the cervical spine. 3. Mild soft tissue swelling overlying the left frontal calvarium. 4. Moderate cortical volume loss and scattered small vessel ischemic microangiopathy. 5. Chronic ischemic change at the basal ganglia bilaterally. 6. Mild degenerative change along the cervical spine. 7. Mild calcification at the carotid bifurcations bilaterally. Carotid ultrasound would be helpful for further evaluation, when and as deemed clinically appropriate. Electronically Signed   By: Roanna RaiderJeffery  Chang M.D.   On: 07/19/2015 22:46   Ct Cervical Spine Wo Contrast  07/19/2015  CLINICAL DATA:  Found in hall, lying on right side. Concern for head or cervical spine injury. Initial encounter. EXAM: CT HEAD WITHOUT CONTRAST CT CERVICAL SPINE WITHOUT CONTRAST TECHNIQUE: Multidetector CT imaging of the head and cervical  spine was performed following the standard protocol without intravenous contrast. Multiplanar CT image reconstructions of the cervical spine were also generated. COMPARISON:  CT of the head and cervical spine performed 07/03/2014 FINDINGS: CT HEAD FINDINGS There is no evidence of acute infarction, mass lesion, or intra- or extra-axial hemorrhage on CT. Prominence of the ventricles and sulci reflects moderate cortical volume loss. Cerebellar atrophy is noted. Scattered periventricular and subcortical white matter change likely reflects small vessel ischemic microangiopathy. Chronic ischemic change is noted at the basal ganglia bilaterally. The brainstem and fourth ventricle are within normal limits. The cerebral hemispheres demonstrate grossly normal gray-white differentiation. No mass effect or midline shift is seen. There is no evidence of fracture; visualized osseous structures are unremarkable in appearance. The visualized portions of the orbits are within normal limits. Mucosal thickening is noted at the right ethmoid air cells. The remaining paranasal sinuses and mastoid air cells are well-aerated. Mild soft tissue swelling is noted overlying the left frontal calvarium. CT CERVICAL SPINE FINDINGS There is no evidence of fracture or subluxation. Vertebral bodies demonstrate normal height and alignment. Mild multilevel disc space narrowing is suggested along the cervical spine, with underlying facet disease noted. Prevertebral soft tissues are within normal limits. The thyroid gland is unremarkable in appearance. The visualized lung apices are clear. Mild calcification is noted at the carotid bifurcations bilaterally. IMPRESSION: 1. No evidence of traumatic intracranial injury or fracture. 2. No evidence of fracture or subluxation along the cervical spine. 3. Mild soft tissue swelling overlying the left frontal calvarium. 4. Moderate cortical volume loss and scattered small vessel ischemic microangiopathy. 5.  Chronic ischemic change at the basal ganglia bilaterally. 6. Mild degenerative change along the cervical spine. 7. Mild calcification at the carotid bifurcations bilaterally. Carotid ultrasound would be helpful for further evaluation, when and as deemed clinically appropriate. Electronically Signed   By: Roanna RaiderJeffery  Chang M.D.   On: 07/19/2015 22:46   Chest Portable 1 View  08/14/2015  CLINICAL DATA:  Preoperative evaluation ; left intertrochanteric femur fracture. EXAM: PORTABLE CHEST 1 VIEW COMPARISON:  July 27, 2015 FINDINGS: There is atelectatic change in the medial right base region. Lungs elsewhere clear. Heart size and pulmonary vascular normal. No adenopathy. Bones are osteoporotic.  IMPRESSION: Medial right base atelectatic change. Lungs elsewhere clear. No change in cardiac silhouette. Electronically Signed   By: Bretta Bang III M.D.   On: 08/14/2015 07:22   Dg C-arm 1-60 Min  07/20/2015  CLINICAL DATA:  .  ORIF of left hip. EXAM: DG C-ARM 61-120 MIN; LEFT FEMUR 2 VIEWS COMPARISON:  07/19/2015 FINDINGS: Intraoperative imaging demonstrating plate and screw fixation of previously described intertrochanteric left femur fracture. No acute hardware complication. IMPRESSION: Internal fixation of intertrochanteric femur fracture. Electronically Signed   By: Jeronimo Greaves M.D.   On: 07/20/2015 19:34   Dg C-arm 1-60 Min-no Report  08/14/2015  CLINICAL DATA: surgery C-ARM 1-60 MINUTES Fluoroscopy was utilized by the requesting physician.  No radiographic interpretation.   Dg Hip Unilat With Pelvis 1v Left  07/19/2015  CLINICAL DATA:  Status post fall, with left hip pain. Initial encounter. EXAM: DG HIP (WITH OR WITHOUT PELVIS) 1V*L* COMPARISON:  None. FINDINGS: There is a mildly displaced intertrochanteric fracture of the proximal left femur. The left femoral head remains seated at the acetabulum. The patient's right femoral prosthesis is grossly unremarkable in appearance. Degenerative change is noted  at the lower lumbar spine. The visualized bowel gas pattern is grossly unremarkable. IMPRESSION: Mildly displaced intertrochanteric fracture of the proximal left femur. Electronically Signed   By: Roanna Raider M.D.   On: 07/19/2015 23:19   Dg Hip Unilat With Pelvis 2-3 Views Left  08/13/2015  CLINICAL DATA:  82 year old female with acute left hip pain today. Left hip intertrochanteric fracture and internal fixation 3 weeks ago. EXAM: DG HIP (WITH OR WITHOUT PELVIS) 2-3V LEFT COMPARISON:  07/20/2015 postoperative spot films. FINDINGS: An intra medullary nail and screws within the left femur are again noted. The intertrochanteric fracture is again identified, but now visualized is an oblique fracture through the infratrochanteric region extending inferiorly from medial to lateral with 6 mm medial displacement. IMPRESSION: New oblique fracture of the infratrochanteric left femur with 6 mm medial displacement. Electronically Signed   By: Harmon Pier M.D.   On: 08/13/2015 15:52   Dg Femur Min 2 Views Left  08/14/2015  CLINICAL DATA:  Intraoperative PLACEMENT OF LEFT DISTAL INTERLOCKING SCREW FEMUR (LEFT) EXAM: DG C-ARM 1-60 MIN-NO REPORT; LEFT FEMUR 2 VIEWS FLUOROSCOPY TIME:  Radiation Exposure Index (as provided by the fluoroscopic device): 4.11 mGy COMPARISON:  07/20/2015 FINDINGS: An interlocking screw has been placed through the caudal and of the left femoral intra medullary nail. IMPRESSION: Postoperative change Electronically Signed   By: Esperanza Heir M.D.   On: 08/14/2015 18:24   Dg Femur Min 2 Views Left  07/20/2015  CLINICAL DATA:  .  ORIF of left hip. EXAM: DG C-ARM 61-120 MIN; LEFT FEMUR 2 VIEWS COMPARISON:  07/19/2015 FINDINGS: Intraoperative imaging demonstrating plate and screw fixation of previously described intertrochanteric left femur fracture. No acute hardware complication. IMPRESSION: Internal fixation of intertrochanteric femur fracture. Electronically Signed   By: Jeronimo Greaves M.D.   On:  07/20/2015 19:34   Dg Femur Min 2 Views Left  07/19/2015  CLINICAL DATA:  Status post fall, with left hip pain. Initial encounter. EXAM: LEFT FEMUR 2 VIEWS COMPARISON:  None. FINDINGS: There is a displaced left femoral intertrochanteric fracture. The left femoral head remains seated at the acetabulum. No additional fractures are seen. The left sacroiliac joint is unremarkable in appearance. No definite soft tissue abnormalities are characterized on radiograph. IMPRESSION: Displaced left femoral intertrochanteric fracture. Electronically Signed   By: Beryle Beams.D.  On: 07/19/2015 23:23    Microbiology: Recent Results (from the past 240 hour(s))  C difficile quick scan w PCR reflex     Status: Abnormal   Collection Time: 08/14/15  6:08 AM  Result Value Ref Range Status   C Diff antigen POSITIVE (A) NEGATIVE Final   C Diff toxin NEGATIVE NEGATIVE Final   C Diff interpretation   Final    C. difficile present, but toxin not detected. This indicates colonization. In most cases, this does not require treatment. If patient has signs and symptoms consistent with colitis, consider treatment. Requires ENTERIC precautions.     Labs: Basic Metabolic Panel:  Recent Labs Lab 08/10/15 0630 08/13/15 1930 08/14/15 0017 08/15/15 0458 08/16/15 0503  NA 144 137  --  143 143  K 4.7 4.9  --  5.5* 4.9  CL 110 107  --  113* 112*  CO2 27 24  --  22 23  GLUCOSE 109* 127*  --  146* 104*  BUN 40* 48*  --  40* 35*  CREATININE 1.46* 1.85* 1.74* 1.30* 1.36*  CALCIUM 8.9 8.4*  --  8.7* 8.6*   Liver Function Tests: No results for input(s): AST, ALT, ALKPHOS, BILITOT, PROT, ALBUMIN in the last 168 hours. No results for input(s): LIPASE, AMYLASE in the last 168 hours. No results for input(s): AMMONIA in the last 168 hours. CBC:  Recent Labs Lab 08/13/15 1930 08/14/15 0215 08/15/15 0458 08/16/15 0503  WBC 9.9 10.5 9.9 8.1  NEUTROABS 6.5  --   --   --   HGB 10.3* 9.2* 8.7* 8.5*  HCT 32.4* 30.0*  28.6* 28.0*  MCV 93.4 95.5 95.7 95.6  PLT 365 321 337 339   Cardiac Enzymes: No results for input(s): CKTOTAL, CKMB, CKMBINDEX, TROPONINI in the last 168 hours. BNP: BNP (last 3 results) No results for input(s): BNP in the last 8760 hours.  ProBNP (last 3 results) No results for input(s): PROBNP in the last 8760 hours.  CBG: No results for input(s): GLUCAP in the last 168 hours.

## 2015-08-16 NOTE — Discharge Instructions (Addendum)
Hip Fracture A hip fracture is a fracture of the upper part of your thigh bone (femur).  CAUSES A hip fracture is caused by a direct blow to the side of your hip. This is usually the result of a fall but can occur in other circumstances, such as an automobile accident. RISK FACTORS There is an increased risk of hip fractures in people with:  An unsteady walking pattern (gait) and those with conditions that contribute to poor balance, such as Parkinson's disease or dementia.  Osteopenia and osteoporosis.  Cancer that spreads to the leg bones.  Certain metabolic diseases. SYMPTOMS  Symptoms of hip fracture include:  Pain over the injured hip.  Inability to put weight on the leg in which the fracture occurred (although, some patients are able to walk after a hip fracture).  Toes and foot of the affected leg point outward when you lie down. DIAGNOSIS A physical exam can determine if a hip fracture is likely to have occurred. X-ray exams are needed to confirm the fracture and to look for other injuries. The X-ray exam can help to determine the type of hip fracture. Rarely, the fracture is not visible on an X-ray image and a CT scan or MRI will have to be done. TREATMENT  The treatment for a fracture is usually surgery. This means using a screw, nail, or rod to hold the bones in place.  HOME CARE INSTRUCTIONS Take all medicines as directed by your health care provider. SEEK MEDICAL CARE IF: Pain continues, even after taking pain medicine. MAKE SURE YOU:  Understand these instructions.   Will watch your condition.  Will get help right away if you are not doing well or get worse.   This information is not intended to replace advice given to you by your health care provider. Make sure you discuss any questions you have with your health care provider.   Document Released: 05/30/2005 Document Revised: 06/04/2013 Document Reviewed: 01/09/2013 Elsevier Interactive Patient Education 2016  [employer]   Can attempt full weight as tolerated left leg, but only up with assistance

## 2015-08-16 NOTE — Clinical Social Work Note (Signed)
CSW was informed by bedside nurse that per MD, patient should be ready for discharge on Monday, 08-17-15 pending discharge orders and patient being medically ready for discharge.  CSW to continue to follow patient's progress  Ervin Knackric R. Mickelle Goupil, MSW, Theresia MajorsLCSWA [phone] 08/16/2015 2:50 PM

## 2015-08-16 NOTE — Progress Notes (Signed)
Patient ID: [name], female   DOB: 1920/09/02, 82 y.o.   MRN: [id number] Keeps pulling her IV out even with [place]ens. Multiple re-starts.  Orthopedically stable.  SNF is plan.

## 2015-08-17 ENCOUNTER — Inpatient Hospital Stay
Admission: RE | Admit: 2015-08-17 | Discharge: 2015-09-09 | Disposition: A | Discharge status: Home / Self Care | Payer: Medicare Other | Source: Ambulatory Visit | Attending: Family Medicine | Admitting: Family Medicine

## 2015-08-17 ENCOUNTER — Encounter (HOSPITAL_COMMUNITY): Payer: Self-pay | Admitting: Orthopaedic Surgery

## 2015-08-17 LAB — CBC
HEMATOCRIT: 27.1 % — AB (ref 36.0–46.0)
Hemoglobin: 8.4 g/dL — ABNORMAL LOW (ref 12.0–15.0)
MCH: 29 pg (ref 26.0–34.0)
MCHC: 31 g/dL (ref 30.0–36.0)
MCV: 93.4 fL (ref 78.0–100.0)
Platelets: 352 10*3/uL (ref 150–400)
RBC: 2.9 MIL/uL — ABNORMAL LOW (ref 3.87–5.11)
RDW: 14.7 % (ref 11.5–15.5)
WBC: 7.1 10*3/uL (ref 4.0–10.5)

## 2015-08-17 NOTE — Progress Notes (Signed)
Pt seen and examined at the bedside Stable for discharge Please refer to D/C summary done  08/16/2015 No changes in medical management since 08/16/2015  Lazarius Rivkin [phone]  

## 2015-08-17 NOTE — Progress Notes (Signed)
Patient is set to discharge back to [facility] SNF today. Patient & son, [name] at bedside aware. Discharge packet given to RN, Amy. [facility] called for transport.     [name] MaxinKelly Zonia Caplin, LCSW [facility] Clinical Social Worker cell #: [phone]

## 2015-08-17 NOTE — Care Management Note (Signed)
Case Management Note  Patient Details  Name: Sanjuana MaeMary E Wendell MRN: [id number] Date of Birth: 03/19/1921  Subjective/Objective:                    Action/Plan:d/c SNF.   Expected Discharge Date:                  Expected Discharge Plan:  Skilled Nursing Facility  In-House Referral:  Clinical Social Work  Discharge planning Services  CM Consult  Post Acute Care Choice:  NA Choice offered to:  NA  DME Arranged:  N/A DME Agency:  NA  HH Arranged:  NA HH Agency:  NA  Status of Service:  Completed, signed off  Medicare Important Message Given:    Date Medicare IM Given:    Medicare IM give by:    Date Additional Medicare IM Given:    Additional Medicare Important Message give by:     If discussed at Long Length of Stay Meetings, dates discussed:    Additional Comments:  Lanier ClamMahabir, Kaevion Sinclair, RN 08/17/2015, 10:22 AM

## 2015-08-17 NOTE — Progress Notes (Signed)
Patient ID: [name], female   DOB: 10-20-1920, 82 y.o.   MRN: [id number] Changed left knee dressing and incision looks good.  Can return to skilled nursing at this standpoint.

## 2015-08-18 ENCOUNTER — Encounter (HOSPITAL_COMMUNITY)
Admission: RE | Admit: 2015-08-18 | Discharge: 2015-08-18 | Disposition: A | Discharge status: Home / Self Care | Payer: Medicare Other | Source: Skilled Nursing Facility | Attending: Internal Medicine | Admitting: Internal Medicine

## 2015-08-18 DIAGNOSIS — I10 Essential (primary) hypertension: Secondary | ICD-10-CM | POA: Insufficient documentation

## 2015-08-18 LAB — CBC
HEMATOCRIT: 30.7 % — AB (ref 36.0–46.0)
HEMOGLOBIN: 9.6 g/dL — AB (ref 12.0–15.0)
MCH: 28.9 pg (ref 26.0–34.0)
MCHC: 31.3 g/dL (ref 30.0–36.0)
MCV: 92.5 fL (ref 78.0–100.0)
Platelets: 381 10*3/uL (ref 150–400)
RBC: 3.32 MIL/uL — ABNORMAL LOW (ref 3.87–5.11)
RDW: 14.5 % (ref 11.5–15.5)
WBC: 6.8 10*3/uL (ref 4.0–10.5)

## 2015-08-18 LAB — BASIC METABOLIC PANEL
ANION GAP: 7 (ref 5–15)
BUN: 29 mg/dL — ABNORMAL HIGH (ref 6–20)
CALCIUM: 8.5 mg/dL — AB (ref 8.9–10.3)
CHLORIDE: 108 mmol/L (ref 101–111)
CO2: 24 mmol/L (ref 22–32)
CREATININE: 1.25 mg/dL — AB (ref 0.44–1.00)
GFR calc non Af Amer: 36 mL/min — ABNORMAL LOW (ref >60)
GFR, EST AFRICAN AMERICAN: 42 mL/min — AB (ref >60)
GLUCOSE: 101 mg/dL — AB (ref 65–99)
Potassium: 4.6 mmol/L (ref 3.5–5.1)
Sodium: 139 mmol/L (ref 135–145)

## 2015-08-19 ENCOUNTER — Non-Acute Institutional Stay (SKILLED_NURSING_FACILITY): Payer: Medicare Other | Admitting: Internal Medicine

## 2015-08-19 DIAGNOSIS — E038 Other specified hypothyroidism: Secondary | ICD-10-CM

## 2015-08-19 DIAGNOSIS — S72145G Nondisplaced intertrochanteric fracture of left femur, subsequent encounter for closed fracture with delayed healing: Secondary | ICD-10-CM

## 2015-08-19 DIAGNOSIS — D638 Anemia in other chronic diseases classified elsewhere: Secondary | ICD-10-CM

## 2015-08-19 DIAGNOSIS — N183 Chronic kidney disease, stage 3 unspecified: Secondary | ICD-10-CM

## 2015-08-19 NOTE — Progress Notes (Signed)
Patient ID: [name], female   DOB: [birth year], 82 y.o.   MRN: [id number]     This is an acute visit.  Level care skilled.  Facility [facility].  Chief complaint acute visit status post re hospitalization for a closed nondisplaced fracture of the left femur--status post repair of new oblique fracture.  History of present illness  Patient is a pleasant 82 year old female with a history of a right hip fracture approximately one year ago who presented to the ED after found on the floor by family member complaining of left hip pain.     She subsequently underwent an ORIF of a left femur fracture and tolerated the procedure well- On initial operation she did have postop anemia-that stabilized.  Patient was admitted skilled nursing for rehabilitation-she presented back to the ED again status post fall x-ray showed a new oblique fracture of the left intertrochanteric femur with slight medial displacement-subsequently an ORIF was done without complications.  She's been discharged on aspirin for prophylaxis.  She also was found to have leukocytes and many bacteria on a urinalysis and she has been started on ciprofloxacin.  She does have some history of mild hyperkalemia and did require a dose of Lasix in the hospital potassium normalized this will need to be rechecked.  Patient does have a history of chronic kidney disease on admission hospital was 1.85 this improved to 1.3 on discharge and actually on March 7 was down to 1.25.  In regards to anemia of chronic renal failure hemoglobin appears to be rising at 9.6.  At this point patient appears to be back at baseline she does have some dementia but does not appear to be uncomfortable apparently is back essentially to her baseline   .  Previous medical history.  History of left hip fracture status post repair x 2 secondary secondary to an oblique fracture after initial fracture  Delirium superimposed on  dementia.  Hypertension.  Leukocytosis thought reactionary.  Hyperkalemia.  Chronic kidney disease stage III.  Hypothyroidism.  Schataki's ring very  Hypertension.  Vertebral compression fracture.  Right knee are still arthritis.  Peripheral neuropathy.  Hiatal hernia.  Previous surgical history.  Appendectomy.  Abdominal hysterectomy.  Cholecystectomy.  Spine surgery.  Hemorrhoid surgery.  Knee surgery.  Previous history of right hip repair up roughly one year ago January 2016.  Medications.   Aspirin 325 mg daily.  Pepcid 20 mg twice a day.  Ferrous sulfate 325 mg daily.  Neurontin 300 mg 3 times a day.  Norco-5-3 25 mg 1 tab every 6 hours when necessary.  Dual nebs every 6 hours when necessary.  Synthroid 125 g daily.  Senokot-S 1 tablet daily at bedtime when necessary.  Tylenol 6 and 50 mg every 6 hours as needed for pain.    Social history patient lives with her brother-the palate she has lived with him for some time he is very supportive and in the room with her today-no history of tobacco use alcohol use or illicit drug use.  Family history significant for MI in her father.  Review of systems.  General does not complain of any fever or chills.  Skin does not complain of rashes or itching surgical site is currently covered.  Eyes does not clear visual changes.  Ear nose mouth and throat does not complaining of sore throat or nasal discharge.  Cardiac not complaining of chest pain fairly minimal lower extremity edema  GI does not complain of nausea vomiting diarrhea constipation or abdominal  discomfort this time.  GU is not complaining of dysuria.  Muscle skeletal at this point hip pain appears to be controlled on current meds.  Neurologic is not complaining of dizziness headache or numbness.  In psych she is not complaining of any depression or anxiety appears to be in good spirits.  Physical exam.  She is afebrile  pulse of 76 respirations 17 blood pressure pending  General this is a pleasant elderly female in no distress.  Her skin is warm and dry surgical site is covered with a left hip.-- What appears to be chronic upper extremity bruising   Eyes pupils appear reactive light sclera and duct were clear visual acuity appears grossly intact.  Oropharynx clear mucous membranes moist.  Chest is clear to auscultation there is no labored breathing.--Respiratory effort is quite poor  Heart regular rate and rhythm without murmur gallop or rub she has some minimal left lower extremity edema pedal pulse is intact somewhat reduced.  Abdomen soft nontender positive bowel sounds.  GU-cannot really appreciate overt suprapubic tenderness to palpation Musculoskeletal there is covering over her left hip site otherwise moves her other 3 extremities appears at baseline I do not note any deformities strength appears to be intact.--Limited exam-patient is sitting in a wheelchair  Neurologic is grossly intact her speech is clear no lateralizing findings.  Psych she is oriented to self she does follow simple verbal commands with some prompting.  Labs.  08/18/2015.  Sodium 139 potassium 4.6 BUN 29 creatinine 1.25  WBC 6.8 hemoglobin 9.6 platelets 381 07/24/2015.  Sodium 139 potassium 4.8 BUN 37 creatinine 1.77.  WBC 7.8 hemoglobin 9.3 platelets 209.  Gen. 25th 2017.  Albumin was 2.5 otherwise liver function tests within normal limits.  Urine culture grew 50,000 colonies of Escherichia coli.  Assessment plan.  1 history of left hip fracture status post repaix 2--with repair of new oblique fracture after fall --r she appears to be stable in this regard  Appears essentially to be baseline she is receiving Norco as needed for pain also has Tylenol for milder pain-she is on aspirin for DVT prophylaxis-will need continued PT and OT this may be complicated somewhat with her history of dementia.  #2 UTI-she  is on Cipro empirically urine culture was not obtainedin hospital-urinalysis did show leukocytes and many bacteria--she is currently afebrile-continue to monitor this     #3 leukocytosis this appears to have resolved is   #4 history of hyperkalemia on admission hospital This apparently is somewhat recurrent she did receive Lasix in the hospital potassium on lab done on March 7 is 4.6 this will need updating early next week  #5 history of chronic renal insufficiency stage III- Creatinine of 1.25 on lab done March 7 shows improvement this will need updating as well as early next week  #6 history of anemia hemoglobin 9.6 --this appears to be improving again will update next week --she is on iron  #7 history of hypothyroidism continues on Synthroid--TSH was elevated at 9.799 on February 13-appears Synthroid has been increased up to 125 g--I suspect this will need updating before her discharge  #8 history of GERD-type symptoms GI prophylaxis-she is on Pepcid twice a day  #9 peripheral neuropathy continues on Neurontin 300 mg 3 times a day this appears to be controlled presently  #10 dementia-this appears to be baseline she is pleasant and cooperative although confused-this may make rehabilitation somewhat more challenging she appears to be doing fairly well with supportive care   [id number]-of note  greater than 35 minutes spent assessing patient-reviewing her chart-and coordinating and formulating a plan of care for numerous diagnoses-of note greater than 50% of time spent coordinating plan of care for her numerous diagnoses

## 2015-08-22 ENCOUNTER — Non-Acute Institutional Stay (SKILLED_NURSING_FACILITY): Payer: Medicare Other | Admitting: Internal Medicine

## 2015-08-22 DIAGNOSIS — S72145D Nondisplaced intertrochanteric fracture of left femur, subsequent encounter for closed fracture with routine healing: Secondary | ICD-10-CM | POA: Diagnosis not present

## 2015-08-22 DIAGNOSIS — D631 Anemia in chronic kidney disease: Secondary | ICD-10-CM

## 2015-08-22 DIAGNOSIS — E038 Other specified hypothyroidism: Secondary | ICD-10-CM | POA: Diagnosis not present

## 2015-08-22 DIAGNOSIS — N183 Chronic kidney disease, stage 3 unspecified: Secondary | ICD-10-CM

## 2015-08-22 NOTE — Progress Notes (Signed)
Patient ID: [name], female   DOB: 04/28/1921, 82 y.o.   MRN: [id number]    Facility; [facility] Chief complaint; readmission to the facility post stay at [facility] 3/2 through 08/17/2015 History; this is a [name] patient who originally came here early in February after suffering a left intertrochanteric hip fracture status post ORIF. She was here for rehabilitation. She suffered a fall in the facility and was readmitted to hospital for a left subtrochanteric fracture of the left femur. This was again surgically repaired by Dr. Magnus Ivan. In the hospital it appeared that she was treated with Cipro for leukocytosis and many bacteria on UA. The urine culture was pending at the time of discharge. Reviewing this today shows that there is no culture to review. She had some degree of renal insufficiency with a creatinine of 1.85. Her potassium was also high. Discharge creatinine was 1.3. She was felt to have anemia of chronic renal failure    BMP Latest Ref Rng 08/18/2015 08/16/2015 08/15/2015  Glucose 65 - 99 mg/dL 782(N) 562(Z) 308(M)  BUN 6 - 20 mg/dL 57(Q) 46(N) 62(X)  Creatinine 0.44 - 1.00 mg/dL 5.28(U) 1.32(G) 4.01(U)  Sodium 135 - 145 mmol/L 139 143 143  Potassium 3.5 - 5.1 mmol/L 4.6 4.9 5.5(H)  Chloride 101 - 111 mmol/L 108 112(H) 113(H)  CO2 22 - 32 mmol/L Calcium 8.9 - 10.3 mg/dL 2.7(O) 5.3(G) 6.4(Q)    CBC Latest Ref Rng 08/18/2015 08/17/2015 08/16/2015  WBC 4.0 - 10.5 K/uL 6.8 7.1 8.1  Hemoglobin 12.0 - 15.0 g/dL 0.3(K) 7.4(Q) 5.9(D)  Hematocrit 36.0 - 46.0 % 30.7(L) 27.1(L) 28.0(L)  Platelets 150 - 400 K/uL 381 352 339    Past Medical History  Diagnosis Date  . Hypothyroidism   . Shortness of breath   . Schatzki's ring   . Hypertension   . Vertebral compression fracture (HCC)   . Arthritis     right knee  . Peripheral neuropathy (HCC)   . Hiatal hernia   . Fx intertrochanteric hip (HCC) 07/23/15    Past Surgical History  Procedure Laterality Date  .  Esophagogastroduodenoscopy  10/24/08    normal, status post [facility] dilator, small hiatal hernia deformity of the antrum/pylorus  . Colonoscopy  10/24/08    anal papilla otherwise normal, pan colonic diverticulum and colonic mucosa  . Appendectomy  1941  . Abdominal hysterectomy  1977  . Cholecystectomy  1967  . Spine surgery  1986  . Knee surgery  09/2008  . Hemorrhoid surgery  1999  . Colonoscopy  remote    Dr. Jerolyn Shin Smith-->polpys per patient  . Ercp  1995    Dr. Rourk--> for dilated biliary tree. no stone or neoplasm or stricture found  . Esophagogastroduodenoscopy  1995    Dr. Rourk--> empiric dilation of esophagus with resolution of dysphagia  . Esophagogastroduodenoscopy   09/21/2011    RMR: Noncritical Schatzki's ring s/p dilated/Abnormal esophageal mucosa/Small  hiatal hermia  . Shoulder surgery    . Cystoscopy with stent placement Bilateral 05/20/2014    Procedure: CYSTOSCOPY WITH BILATERAL DOUBLE J STENT PLACEMENT;  Surgeon: Chelsea Aus, MD;  Location: [facility];  Service: Urology;  Laterality: Bilateral;  . Holmium laser application Bilateral 06/26/2014    Procedure: HOLMIUM LASER APPLICATION;  Surgeon: Chelsea Aus, MD;  Location: [facility];  Service: Urology;  Laterality: Bilateral;  . Hip arthroplasty Right 07/04/2014    Procedure: RIGHT PARTIAL HIP REPLACEMENT;  Surgeon: Vickki Hearing, MD;  Location: [facility];  Service: Orthopedics;  Laterality: Right;  . Intramedullary (im) nail intertrochanteric Left 07/20/2015    Procedure: INTRAMEDULLARY (IM) NAIL INTERTROCHANTRIC LEFT HIP;  Surgeon: Kathryne Hitchhristopher Y Blackman, MD;  Location: [facility];  Service: Orthopedics;  Laterality: Left;  . Hip fracture surgery  left hip repair  . Orif femur fracture Left 08/14/2015    Procedure: PLACEMENT OF LEFT DISTAL INTERLOCKING SCREW FEMUR;  Surgeon: Kathryne Hitchhristopher Y Blackman, MD;  Location: [facility];  Service: Orthopedics;  Laterality: Le      Current medications Aspirin 325 daily Pepcid 20  twice a day Ferrous sulfate 325 daily Neurontin 303 times daily Nor cold 1 tablet every 6 hours when necessary Duonebs every 6 hours when necessary Synthroid 125 g daily      Physical Examination  Heent: oral exam normal Respiratory exam is clear Cardiac heart sounds are normal she appears to be euvolemic Abdomen no liver spleen or masses. Musculoskeletal; her incision is actually on her distal anterior lateral thigh just above her knee. Things look very clean here there is no tenderness Extremities no evidence of a DVT. GU; she has a Foley catheter and again I'll try to pull this on Monday. We were successful with this last time Mental status the patient appeared to be at her baseline I see no major issues.  Impression/plan #1 status post recurrent fracture of her subtrochanteric left femur. She has undergone operative repair and once again appears to be doing fairly well #2 recurrent falls. It is very clear that this patient is going to fall if she stands. This is a problem. #3 postoperative delirium on the background of dementia. This appears to have resolved #4 chronic renal failure stage III with anemia felt to be secondary to chronic renal failure and #5 hypothyroidism on replacement; her TSH was overnighting on 2/13. I note that on 2/14 her Synthroid was increased to 125 g. We will check this again in 2 weeks

## 2015-08-23 ENCOUNTER — Encounter: Payer: Self-pay | Admitting: Internal Medicine

## 2015-08-25 ENCOUNTER — Encounter (HOSPITAL_COMMUNITY)
Admission: AD | Admit: 2015-08-25 | Discharge: 2015-08-25 | Disposition: A | Discharge status: Home / Self Care | Payer: Medicare Other | Source: Skilled Nursing Facility | Attending: Internal Medicine | Admitting: Internal Medicine

## 2015-08-25 LAB — TSH: TSH: 35.756 u[IU]/mL — ABNORMAL HIGH (ref 0.350–4.500)

## 2015-08-26 ENCOUNTER — Other Ambulatory Visit: Payer: Self-pay

## 2015-08-26 MED ORDER — HYDROCODONE-ACETAMINOPHEN 5-325 MG PO TABS: 1.0000 | ORAL_TABLET | Freq: Four times a day (QID) | ORAL | Status: DC | PRN

## 2015-08-26 NOTE — Telephone Encounter (Signed)
RX faxed to [facility] @ [phone]. Phone number [phone]  

## 2015-09-04 ENCOUNTER — Encounter (HOSPITAL_COMMUNITY)
Admission: AD | Admit: 2015-09-04 | Discharge: 2015-09-04 | Disposition: A | Discharge status: Home / Self Care | Payer: Medicare Other | Source: Skilled Nursing Facility | Attending: Internal Medicine | Admitting: Internal Medicine

## 2015-09-04 LAB — CBC WITH DIFFERENTIAL/PLATELET
BASOS ABS: 0 10*3/uL (ref 0.0–0.1)
BASOS PCT: 1 %
EOS PCT: 9 %
Eosinophils Absolute: 0.7 10*3/uL (ref 0.0–0.7)
HEMATOCRIT: 34.2 % — AB (ref 36.0–46.0)
HEMOGLOBIN: 10.6 g/dL — AB (ref 12.0–15.0)
Lymphocytes Relative: 32 %
Lymphs Abs: 2.3 10*3/uL (ref 0.7–4.0)
MCH: 29.2 pg (ref 26.0–34.0)
MCHC: 31 g/dL (ref 30.0–36.0)
MCV: 94.2 fL (ref 78.0–100.0)
MONO ABS: 0.6 10*3/uL (ref 0.1–1.0)
Monocytes Relative: 8 %
Neutro Abs: 3.5 10*3/uL (ref 1.7–7.7)
Neutrophils Relative %: 50 %
Platelets: 392 10*3/uL (ref 150–400)
RBC: 3.63 MIL/uL — AB (ref 3.87–5.11)
RDW: 15.1 % (ref 11.5–15.5)
WBC: 7 10*3/uL (ref 4.0–10.5)

## 2015-09-04 LAB — CBC AND DIFFERENTIAL
Neutrophils Absolute: 4 /uL
WBC: 7 10*3/mL

## 2015-09-04 LAB — BASIC METABOLIC PANEL
ANION GAP: 8 (ref 5–15)
BUN: 43 mg/dL — ABNORMAL HIGH (ref 6–20)
CHLORIDE: 111 mmol/L (ref 101–111)
CO2: 25 mmol/L (ref 22–32)
CREATININE: 1.46 mg/dL — AB (ref 0.44–1.00)
Calcium: 8.8 mg/dL — ABNORMAL LOW (ref 8.9–10.3)
GFR calc non Af Amer: 30 mL/min — ABNORMAL LOW (ref >60)
GFR, EST AFRICAN AMERICAN: 35 mL/min — AB (ref >60)
Glucose, Bld: 98 mg/dL (ref 65–99)
Potassium: 4.9 mmol/L (ref 3.5–5.1)
SODIUM: 144 mmol/L (ref 135–145)

## 2015-09-04 LAB — TSH
TSH: 29.94 u[IU]/mL — AB (ref <=5.90)
TSH: 29.943 u[IU]/mL — ABNORMAL HIGH (ref 0.350–4.500)

## 2015-09-07 ENCOUNTER — Encounter (HOSPITAL_COMMUNITY)
Admission: RE | Admit: 2015-09-07 | Discharge: 2015-09-07 | Disposition: A | Discharge status: Home / Self Care | Payer: Medicare Other | Source: Skilled Nursing Facility | Attending: *Deleted | Admitting: *Deleted

## 2015-09-07 DIAGNOSIS — I10 Essential (primary) hypertension: Secondary | ICD-10-CM | POA: Diagnosis present

## 2015-09-07 LAB — CBC WITH DIFFERENTIAL/PLATELET
Basophils Absolute: 0 10*3/uL (ref 0.0–0.1)
Basophils Relative: 0 %
EOS ABS: 0.5 10*3/uL (ref 0.0–0.7)
EOS PCT: 6 %
HCT: 37.2 % (ref 36.0–46.0)
Hemoglobin: 11.4 g/dL — ABNORMAL LOW (ref 12.0–15.0)
LYMPHS ABS: 2.5 10*3/uL (ref 0.7–4.0)
Lymphocytes Relative: 28 %
MCH: 29.1 pg (ref 26.0–34.0)
MCHC: 30.6 g/dL (ref 30.0–36.0)
MCV: 94.9 fL (ref 78.0–100.0)
MONOS PCT: 6 %
Monocytes Absolute: 0.5 10*3/uL (ref 0.1–1.0)
Neutro Abs: 5.3 10*3/uL (ref 1.7–7.7)
Neutrophils Relative %: 60 %
PLATELETS: 412 10*3/uL — AB (ref 150–400)
RBC: 3.92 MIL/uL (ref 3.87–5.11)
RDW: 15.3 % (ref 11.5–15.5)
WBC: 8.8 10*3/uL (ref 4.0–10.5)

## 2015-09-07 LAB — TSH: TSH: 23.343 u[IU]/mL — ABNORMAL HIGH (ref 0.350–4.500)

## 2015-09-07 LAB — BASIC METABOLIC PANEL
Anion gap: 7 (ref 5–15)
BUN: 41 mg/dL — ABNORMAL HIGH (ref 6–20)
BUN: 41 mg/dL — AB (ref 4–21)
CALCIUM: 8.7 mg/dL — AB (ref 8.9–10.3)
CO2: 24 mmol/L (ref 22–32)
CREATININE: 1.38 mg/dL — AB (ref 0.44–1.00)
CREATININE: 1.4 mg/dL — AB (ref <=1.1)
Chloride: 112 mmol/L — ABNORMAL HIGH (ref 101–111)
GFR calc Af Amer: 37 mL/min — ABNORMAL LOW (ref >60)
GFR, EST NON AFRICAN AMERICAN: 32 mL/min — AB (ref >60)
GLUCOSE: 102 mg/dL — AB (ref 65–99)
GLUCOSE: 102 mg/dL
Potassium: 5 mmol/L (ref 3.5–5.1)
SODIUM: 135 mmol/L — AB (ref 137–147)
Sodium: 143 mmol/L (ref 135–145)

## 2015-09-08 ENCOUNTER — Non-Acute Institutional Stay (SKILLED_NURSING_FACILITY): Payer: Medicare Other | Admitting: Internal Medicine

## 2015-09-08 ENCOUNTER — Encounter: Payer: Self-pay | Admitting: Internal Medicine

## 2015-09-08 DIAGNOSIS — E038 Other specified hypothyroidism: Secondary | ICD-10-CM | POA: Diagnosis not present

## 2015-09-08 DIAGNOSIS — N183 Chronic kidney disease, stage 3 unspecified: Secondary | ICD-10-CM

## 2015-09-08 DIAGNOSIS — S72002D Fracture of unspecified part of neck of left femur, subsequent encounter for closed fracture with routine healing: Secondary | ICD-10-CM

## 2015-09-08 DIAGNOSIS — E875 Hyperkalemia: Secondary | ICD-10-CM | POA: Diagnosis not present

## 2015-09-08 NOTE — Progress Notes (Deleted)
Patient ID: [name], female   DOB: 1920-10-08, 82 y.o.   MRN: [id number]

## 2015-09-08 NOTE — Progress Notes (Signed)
Patient ID: [name], female   DOB: Jun 22, 1920, 82 y.o.   MRN: [id number]  Location:  [facility]   Place of Service:  SNF (31)  This is a discharge note  PCP: [name], MD Patient Care Team: Butch Penny, MD as PCP - General (Family Medicine) Corbin Ade, MD (Gastroenterology)  Extended Emergency Contact Information Primary Emergency Contact: Janelle,Bobby Address: [address]          [place], [place] [address] [place] Home Phone: [phone] Mobile Phone: [phone] Relation: Son Secondary Emergency Contact: Gretchen Portela Address: [address]          [place], [place] [address] [place] Home Phone: [phone] Relation: Daughter   Goals of care:  Advanced Directive information Advanced Directives 08/13/2015  Does patient have an advance directive? Yes  Type of Advance Directive Out of facility DNR (pink MOST or yellow form)  Pre-existing out of facility DNR order (yellow form or pink MOST form) Yellow form placed in chart (order not valid for inpatient use)     Allergies  Allergen Reactions  . Lactulose     REACTION: unknown reaction  . Penicillins     REACTION: unknown reaction  . Prilosec [Omeprazole] Itching and Rash    Chief Complaint  Patient presents with  . Discharge Note    HPI:  82 y.o. female  who is being seen for discharge--she was allegedly admitted here with a left hip fracture-she underwent an ORIF and tolerated the procedure well.  She was admitted to skilled nursing for rehabilitation been present back to the ER after a fall that showed a new oblique fracture of the left intertrochanteric femur with slight medial displacement-subsequently an ORIF was done without complications.  She was discharged on aspirin for prophylaxis.  She also was treated for a UTI.  She also had mild hyperkalemia in the hospital and did require a dose Lasix testing did normalize and this has remained largely  stable subsequently.  She does have a history of chronic kidney disease on admission to hospital was 1.85 this has improved somewhat most recently 1.38 on lab done yesterday.  I do note her potassium is high normal at 5.  In regards to anemia this was thought secondary to chronic renal failure as well as I suspect an element of blood loss from surgery-this is responding well it is up to 11.4 on lab than yesterday.  Patient apparently has not progressed very well with therapy therapy has discharged her-she will be going home she does have a very supportive son who lives with her.  She has no acute complaints today vital signs been stable she is a poor historian secondary to dementia  I do note her TSH is elevated 23.343 on lab done yesterday this actually is trending down however in mid February her Synthroid was increased this appears to be going in the correct direction but will need updating by primary care provider in the near future    Past Medical History  Diagnosis Date  . Hypothyroidism   . Shortness of breath   . Schatzki's ring   . Hypertension   . Vertebral compression fracture (HCC)   . Arthritis     right knee  . Peripheral neuropathy (HCC)   . Hiatal hernia   . Fx intertrochanteric hip (HCC) 07/23/15    Past Surgical History  Procedure Laterality Date  . Esophagogastroduodenoscopy  10/24/08    normal, status post [facility] dilator,  small hiatal hernia deformity of the antrum/pylorus  . Colonoscopy  10/24/08    anal papilla otherwise normal, pan colonic diverticulum and colonic mucosa  . Appendectomy  1941  . Abdominal hysterectomy  1977  . Cholecystectomy  1967  . Spine surgery  1986  . Knee surgery  09/2008  . Hemorrhoid surgery  1999  . Colonoscopy  remote    Dr. Jerolyn Shin Smith-->polpys per patient  . Ercp  1995    Dr. Rourk--> for dilated biliary tree. no stone or neoplasm or stricture found  . Esophagogastroduodenoscopy  1995    Dr. Rourk--> empiric dilation of  esophagus with resolution of dysphagia  . Esophagogastroduodenoscopy   09/21/2011    RMR: Noncritical Schatzki's ring s/p dilated/Abnormal esophageal mucosa/Small  hiatal hermia  . Shoulder surgery    . Cystoscopy with stent placement Bilateral 05/20/2014    Procedure: CYSTOSCOPY WITH BILATERAL DOUBLE J STENT PLACEMENT;  Surgeon: Chelsea Aus, MD;  Location: [facility];  Service: Urology;  Laterality: Bilateral;  . Holmium laser application Bilateral 06/26/2014    Procedure: HOLMIUM LASER APPLICATION;  Surgeon: Chelsea Aus, MD;  Location: [facility];  Service: Urology;  Laterality: Bilateral;  . Hip arthroplasty Right 07/04/2014    Procedure: RIGHT PARTIAL HIP REPLACEMENT;  Surgeon: Vickki Hearing, MD;  Location: [facility];  Service: Orthopedics;  Laterality: Right;  . Intramedullary (im) nail intertrochanteric Left 07/20/2015    Procedure: INTRAMEDULLARY (IM) NAIL INTERTROCHANTRIC LEFT HIP;  Surgeon: Kathryne Hitch, MD;  Location: [facility];  Service: Orthopedics;  Laterality: Left;  . Hip fracture surgery  left hip repair  . Orif femur fracture Left 08/14/2015    Procedure: PLACEMENT OF LEFT DISTAL INTERLOCKING SCREW FEMUR;  Surgeon: Kathryne Hitch, MD;  Location: [facility];  Service: Orthopedics;  Laterality: Left;      reports that she has never smoked. She does not have any smokeless tobacco history on file. She reports that she does not drink alcohol or use illicit drugs. Social History   Social History  . Marital Status: Widowed    Spouse Name: N/A  . Number of Children: 3  . Years of Education: N/A   Occupational History  . Retired Neurosurgeon    Social History Main Topics  . Smoking status: Never Smoker   . Smokeless tobacco: Not on file  . Alcohol Use: No  . Drug Use: No  . Sexual Activity: Not on file   Other Topics Concern  . Not on file   Social History Narrative   Functional Status Survey:    Allergies  Allergen Reactions  . Lactulose      REACTION: unknown reaction  . Penicillins     REACTION: unknown reaction  . Prilosec [Omeprazole] Itching and Rash    Pertinent  Health Maintenance Due  Topic Date Due  . DEXA SCAN  09/05/1987  . PNA vac Low Risk Adult (1 of 2 - PCV13) 09/05/1987  . INFLUENZA VACCINE  01/12/2015     Medication List    Notice    This visit is during an admission. Changes to the med list made in this visit will be reflected in the After Visit Summary of the admission.          Review of Systems   Essentially unattainable secondary to dementia and nursing staff does not report any acute issues apparently there have been difficulties getting participation with therapy her pain appears to be controlled does not complain of shortness of breath or  chest pain  Filed Vitals:   09/08/15 0830  BP: 143/68  Pulse: 61  Temp: 97.1 F (36.2 C)  TempSrc: Oral  Resp: 19  Weight: 153 lb (69.4 kg)   Body mass index is 27.98 kg/(m^2). Physical Exam    In general this is a pleasant LE female in no distress sitting comfortably in her wheelchair.  Her skin is warm and dry.  .  Eyes pupils appear reactive to light visual acuity appears grossly intact.  Oropharynx is clear.  Chest is clear to auscultation with somewhat poor respiratory effort there is no labored breathing.  Heart is distant heart sounds radial pulse is regular rate and rhythm-she does not really have significant lower extremity edema pedal pulses are intact although somewhat reduced bilaterally.  Abdomen is soft nontender with positive bowel sounds.  Musculoskeletal is able to move all extremities 4 but has bilateral lower extremity weakness she is ambulatory in a wheelchair again getting her to cooperate with therapy has been a challenge  Neurologic is grossly intact speech is clear but she does not really speak much at could not appreciate any lateralizing findings.  Psych she is oriented to self with prompting will follow  simple verbal commands  Labs reviewed: Basic Metabolic Panel:  Recent Labs  56/21/3000/12/27 0320  08/18/15 0735 09/04/15 0750 09/07/15 09/07/15 0630  NA 141  < > 139 144 135* 143  K 4.9  < > 4.6 4.9  --  5.0  CL 106  < > 108 111  --  112*  CO2 24  < > 24 25  --  24  GLUCOSE 147*  < > 101* 98  --  102*  BUN 25*  < > 29* 43* 41* 41*  CREATININE 1.45*  < > 1.25* 1.46* 1.4* 1.38*  CALCIUM 8.8*  < > 8.5* 8.8*  --  8.7*  MG 1.6*  --   --   --   --   --   < > = values in this interval not displayed. Liver Function Tests: No results for input(s): AST, ALT, ALKPHOS, BILITOT, PROT, ALBUMIN in the last 8760 hours. No results for input(s): LIPASE, AMYLASE in the last 8760 hours. No results for input(s): AMMONIA in the last 8760 hours. CBC:  Recent Labs  08/18/15 0735 09/04/15 09/04/15 0750 09/07/15 1145  WBC 6.8 7.0 7.0 8.8  NEUTROABS  --  4 3.5 5.3  HGB 9.6*  --  10.6* 11.4*  HCT 30.7*  --  34.2* 37.2  MCV 92.5  --  94.2 94.9  PLT 381  --  392 412*   Cardiac Enzymes: No results for input(s): CKTOTAL, CKMB, CKMBINDEX, TROPONINI in the last 8760 hours. BNP: Invalid input(s): POCBNP CBG:  Recent Labs  07/20/15 0714 07/20/15 1426 07/20/15 1722  GLUCAP 96 108* 95    Procedures and Imaging Studies During Stay: Chest Portable 1 View  08/14/2015  CLINICAL DATA:  Preoperative evaluation ; left intertrochanteric femur fracture. EXAM: PORTABLE CHEST 1 VIEW COMPARISON:  July 27, 2015 FINDINGS: There is atelectatic change in the medial right base region. Lungs elsewhere clear. Heart size and pulmonary vascular normal. No adenopathy. Bones are osteoporotic. IMPRESSION: Medial right base atelectatic change. Lungs elsewhere clear. No change in cardiac silhouette. Electronically Signed   By: Bretta BangWilliam  Woodruff III M.D.   On: 08/14/2015 07:22   Dg C-arm 1-60 Min-no Report  08/14/2015  CLINICAL DATA: surgery C-ARM 1-60 MINUTES Fluoroscopy was utilized by the requesting physician.  No  radiographic interpretation.  Dg Hip Unilat With Pelvis 2-3 Views Left  08/13/2015  CLINICAL DATA:  82 year old female with acute left hip pain today. Left hip intertrochanteric fracture and internal fixation 3 weeks ago. EXAM: DG HIP (WITH OR WITHOUT PELVIS) 2-3V LEFT COMPARISON:  07/20/2015 postoperative spot films. FINDINGS: An intra medullary nail and screws within the left femur are again noted. The intertrochanteric fracture is again identified, but now visualized is an oblique fracture through the infratrochanteric region extending inferiorly from medial to lateral with 6 mm medial displacement. IMPRESSION: New oblique fracture of the infratrochanteric left femur with 6 mm medial displacement. Electronically Signed   By: Harmon Pier M.D.   On: 08/13/2015 15:52   Dg Femur Min 2 Views Left  08/14/2015  CLINICAL DATA:  Intraoperative PLACEMENT OF LEFT DISTAL INTERLOCKING SCREW FEMUR (LEFT) EXAM: DG C-ARM 1-60 MIN-NO REPORT; LEFT FEMUR 2 VIEWS FLUOROSCOPY TIME:  Radiation Exposure Index (as provided by the fluoroscopic device): 4.11 mGy COMPARISON:  07/20/2015 FINDINGS: An interlocking screw has been placed through the caudal and of the left femoral intra medullary nail. IMPRESSION: Postoperative change Electronically Signed   By: Esperanza Heir M.D.   On: 08/14/2015 18:24    Assessment/Plan:   #1-history of left hip fractures and we fracture-clinically she appears to be stable although participation with therapy has been a challenge-she is on aspirin for anticoagulation receiving Vicodin as needed for pain which appears to be controlled at this time.-She will need a wheelchair for ambulation-also attempt PTOT home health basis  #2 history hypothyroidism as noted TSH has been high but is trending down after medication adjustment will defer to primary care provider for follow-up.  #3 history of anemia hemoglobin actually is responding well at 11.4 on lab done yesterday she continues on  iron.  #4-history of hyperkalemia this has normalized although appears to be on the high end of normal this will need rechecking later this week with follow-up by primary care provider.  #5 history of chronic kidney disease-this appears relatively baseline with creatinine 1.38 BUN of 41.  #6 history of dementia she appears to be doing relatively well with supportive care although again participation with therapy has been quite a challenge at this point will monitor she does have strong family support from her son at home.        Patient has been advised to f/u with their PCP in 1-2 weeks to bring them up to date on their rehab stay.  Social services at facility was responsible for arranging this appointment.  Pt was provided with a 30 day supply of prescriptions for medications and refills must be obtained from their PCP.  For controlled substances, a more limited supply may be provided adequate until PCP appointment only.  Future labs/tests needed:   We will need an updated TSH by primary care provider.  In regards to renal function will need an updated BMP later this week with primary care provider notified of results  [phone] note greater than 30 minutes spent assessing patient-reviewing her chart-in coordinating plan of care on discharge-of note greater than 50% of time spent coordinating plan of care for numerous diagnoses-.  Medications have been reviewed  Current Outpatient Prescriptions on File Prior to Visit  Medication Sig Dispense Refill  . acetaminophen (PAIN RELIEVER) 325 MG tablet Take 650 mg by mouth every 6 (six) hours as needed for mild pain or moderate pain.    Marland Kitchen aspirin EC 325 MG EC tablet Take 1 tablet (325 mg total) by mouth  daily with breakfast. 30 tablet 0  . famotidine (PEPCID) 20 MG tablet Take 1 tablet (20 mg total) by mouth 2 (two) times daily.    . ferrous sulfate 325 (65 FE) MG tablet Take 325 mg by mouth daily with breakfast.    . gabapentin  (NEURONTIN) 300 MG capsule Take 1 capsule by mouth 3 (three) times daily.    Marland Kitchen HYDROcodone-acetaminophen (NORCO/VICODIN) 5-325 MG tablet Take 1-2 tablets by mouth every 6 (six) hours as needed for moderate pain. DO NOT EXCEED 4GM OF APAP IN 24 HOURS FROM ALL SOURCES 120 tablet 0  . ipratropium-albuterol (DUONEB) 0.5-2.5 (3) MG/3ML SOLN Take 3 mLs by nebulization every 6 (six) hours as needed (Shortness of breath). Reported on 07/19/2015    . levothyroxine (SYNTHROID, LEVOTHROID) 125 MCG tablet Take 125 mcg by mouth daily before breakfast.    . senna-docusate (SENOKOT-S) 8.6-50 MG tablet Take 1 tablet by mouth at bedtime as needed for mild constipation.     No current facility-administered medications on file prior to visit.

## 2015-12-03 IMAGING — CR DG CHEST 1V PORT
1 series · 1 of 1 positions shown · non-contrast
Comparison: DG RIBS UNILATERAL W/CHEST*R* dated 08/28/2013

CLINICAL DATA: Fever

EXAM:
PORTABLE CHEST - 1 VIEW

[portable]
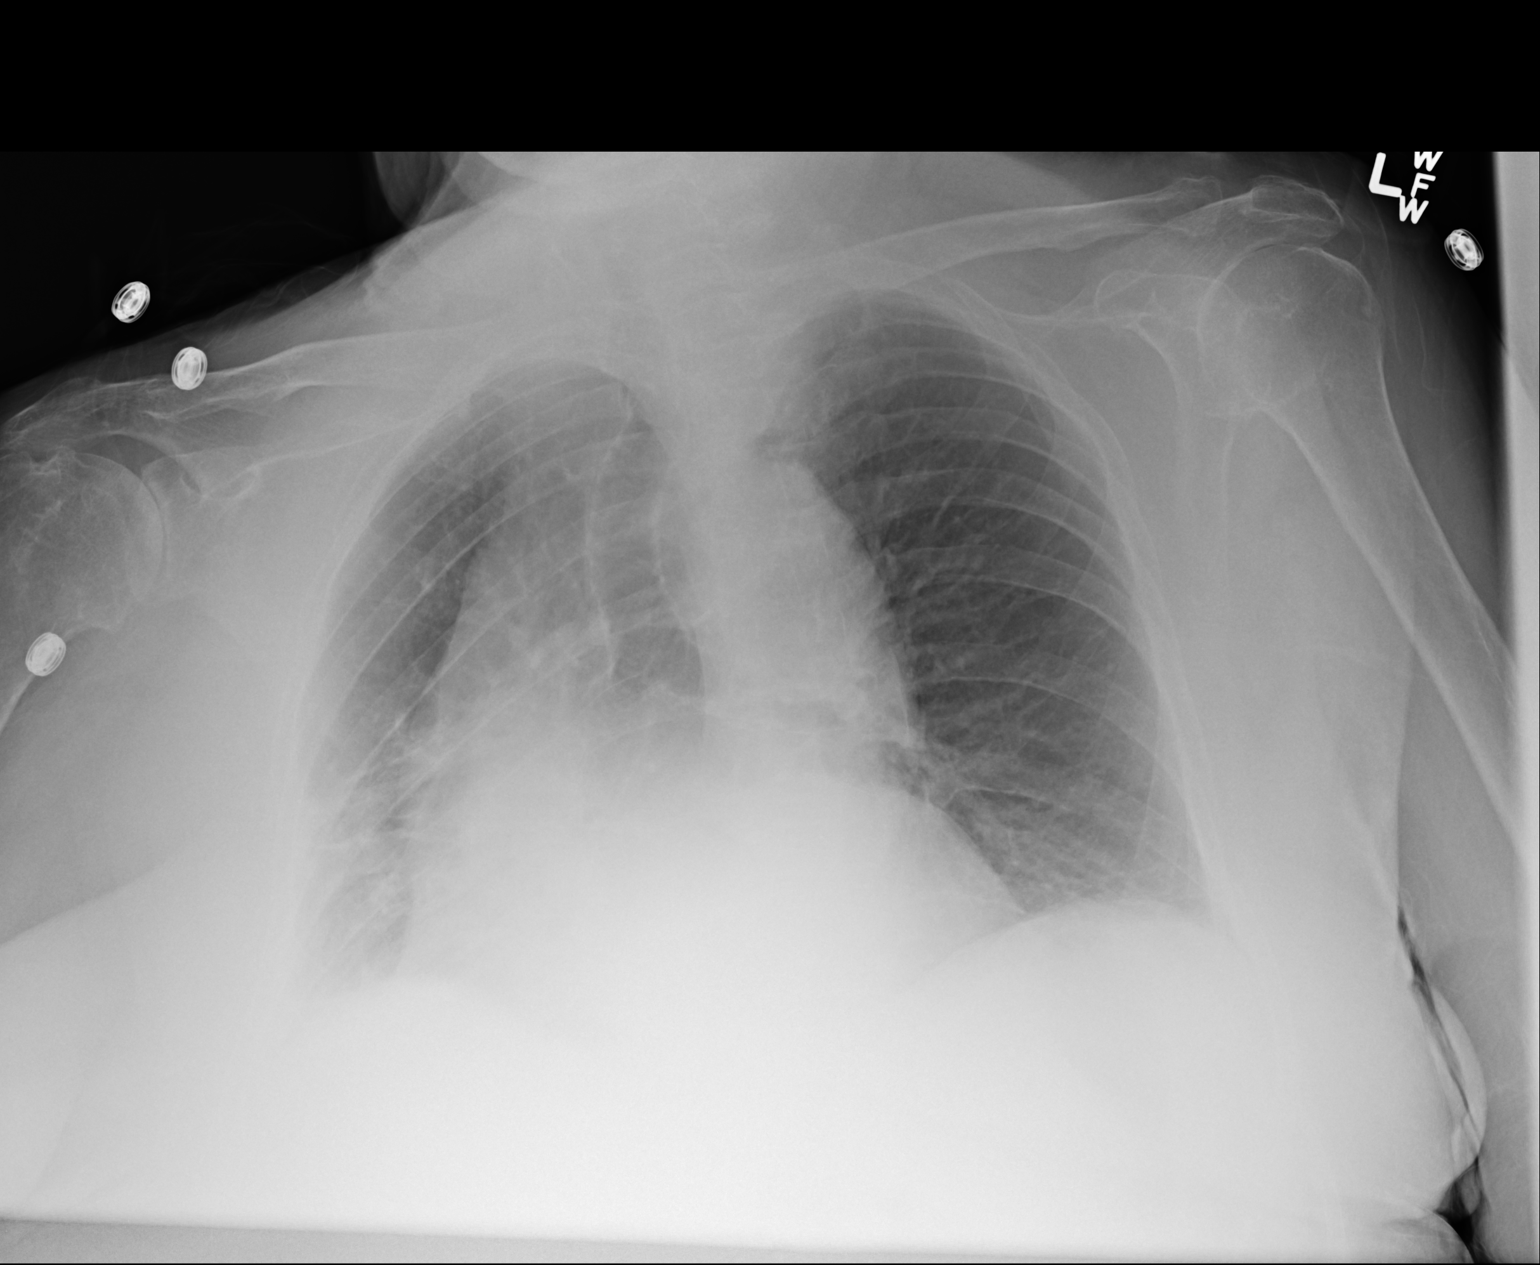

[1 of 1 positions shown; findings below may reference images not displayed]

FINDINGS: Patient is rotated rightward. The cardiac silhouette is enlarged
similar prior. There is right basilar atelectasis or potential
infiltrate not changed from prior. Left lung is relatively clear. No
significant change.
IMPRESSION: Right lower lobe atelectasis versus infiltrate.

## 2015-12-03 IMAGING — CT CT HEAD W/O CM
1 of 2 series · 9 of 30 positions shown, 12 images · non-contrast
Comparison: 11/09/2012

CLINICAL DATA: Weakness, feeling bad for couple days, fever, fell
tonight striking head on ground, history hypertension

EXAM:
CT HEAD WITHOUT CONTRAST
TECHNIQUE: Contiguous axial images were obtained from the base of the skull
through the vertex without intravenous contrast.

[Series 2: headtrauma 4.8 h37s · axial · 0.43mm/px · z∈[+87,+231]mm · 9 of 36 slices shown, 12 images]
[im 4/36  brain]
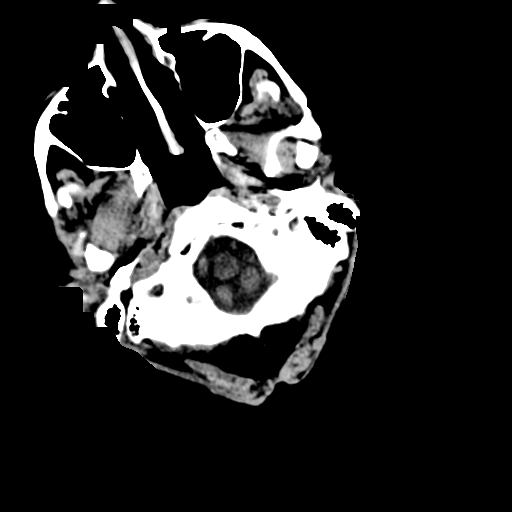
[im 4/36  bone]
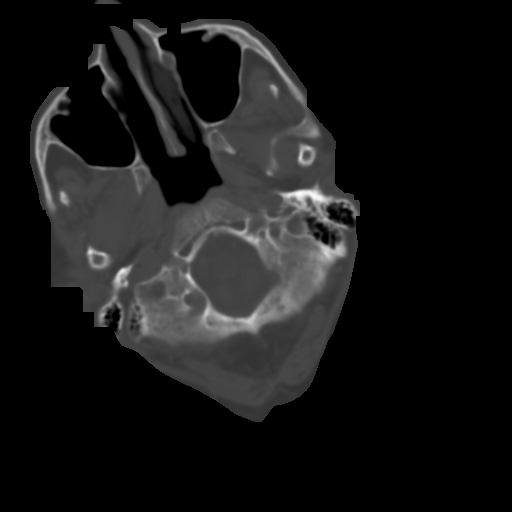
[im 8/36  brain]
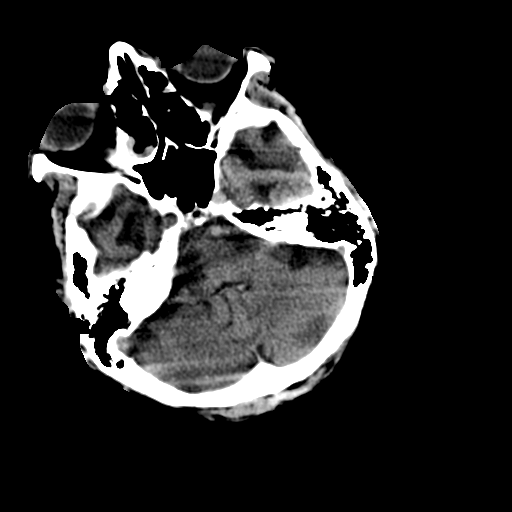
[im 11/36  brain]
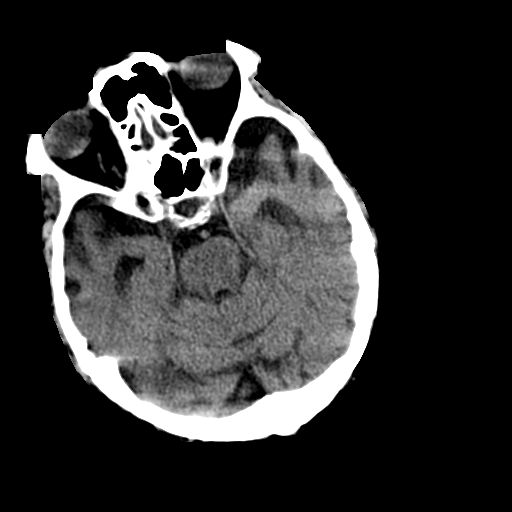
[im 15/36  brain]
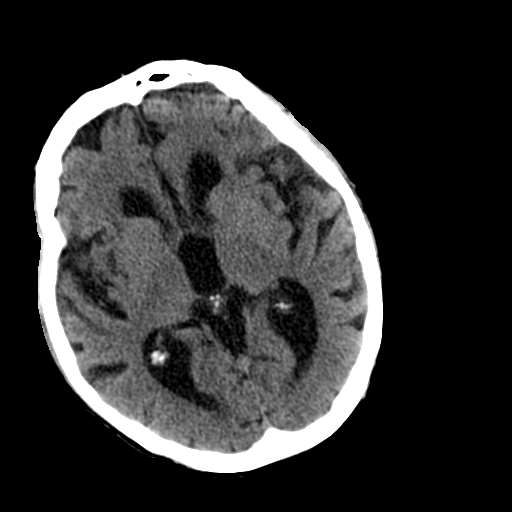
[im 18/36  brain]
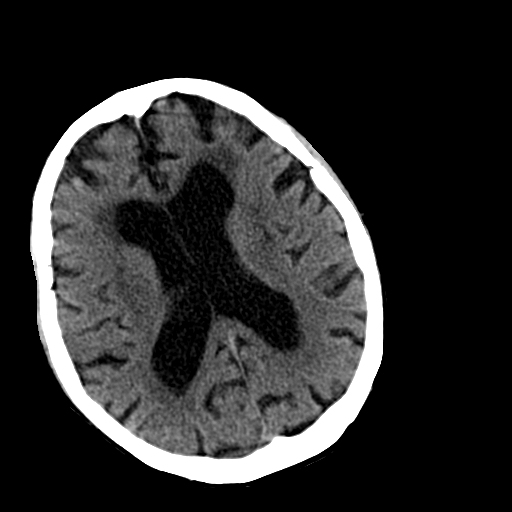
[im 18/36  bone]
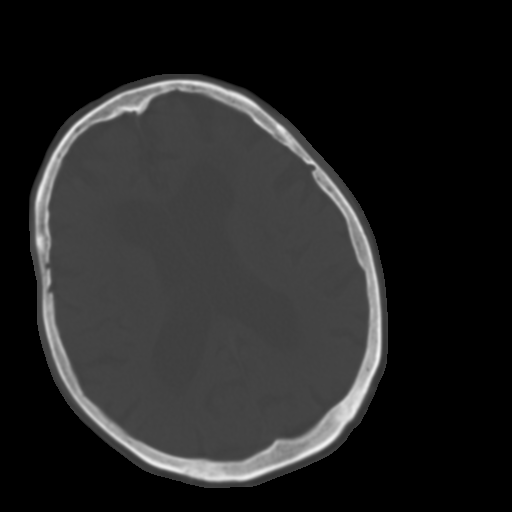
[im 22/36  brain]
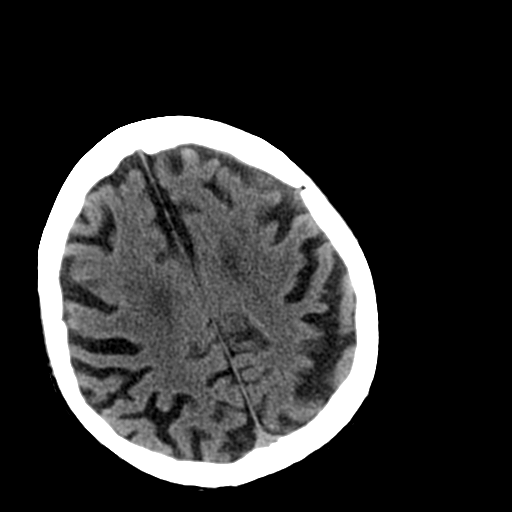
[im 25/36  brain]
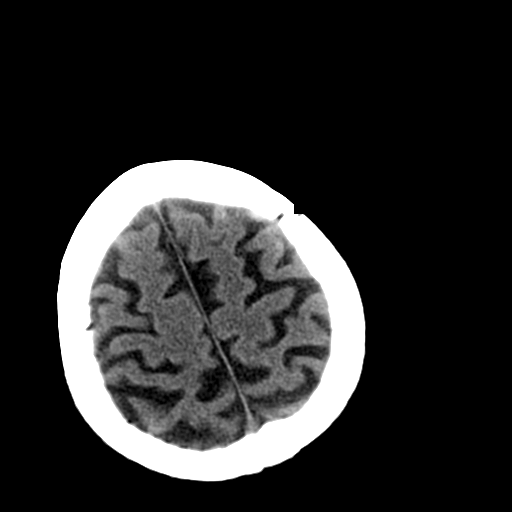
[im 29/36  brain]
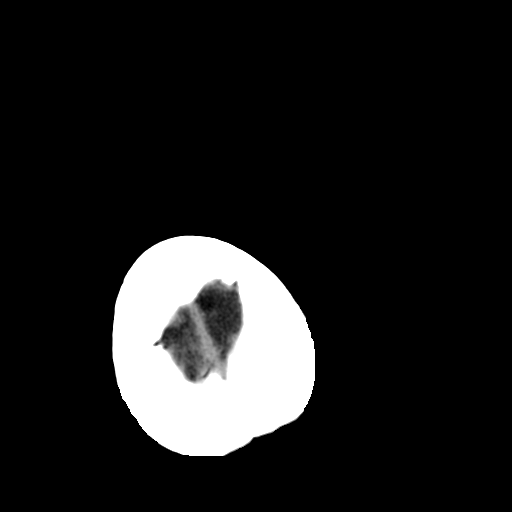
[im 32/36  brain]
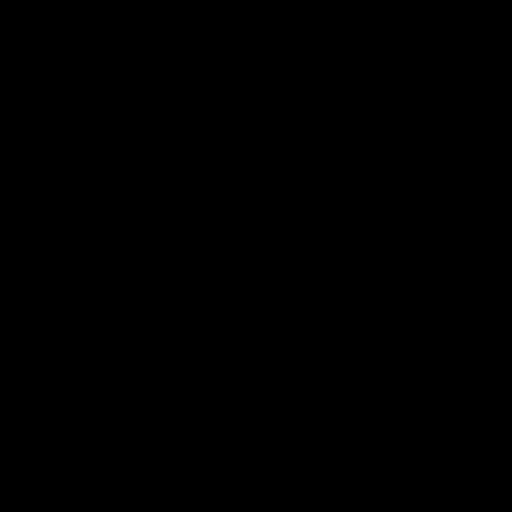
[im 32/36  bone]
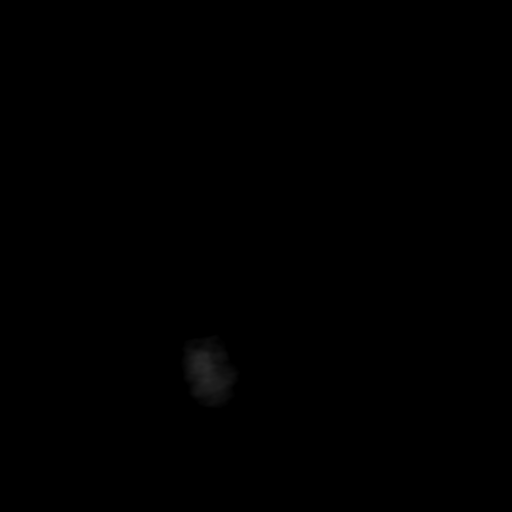

[9 of 30 positions shown; findings below may reference images not displayed]

FINDINGS: Streak artifacts at skull base.

Generalized atrophy.

Normal ventricular morphology.

No midline shift or mass effect.

Small vessel chronic ischemic changes of deep cerebral white matter.

No intracranial hemorrhage, mass lesion, or acute infarction.

Partial opacification of right ethmoid air cells.

Visualized paranasal sinuses and mastoid air cells otherwise clear.

Bones demineralized.
IMPRESSION: Atrophy with small vessel chronic ischemic changes of deep cerebral
white matter.

No acute intracranial abnormalities identified on exam mildly
limited by patient motion artifacts.

## 2017-08-20 ENCOUNTER — Other Ambulatory Visit: Payer: Self-pay

## 2017-08-20 ENCOUNTER — Emergency Department (HOSPITAL_COMMUNITY): Payer: Medicare Other

## 2017-08-20 ENCOUNTER — Encounter (HOSPITAL_COMMUNITY): Payer: Self-pay | Admitting: *Deleted

## 2017-08-20 ENCOUNTER — Inpatient Hospital Stay (HOSPITAL_COMMUNITY)
Admission: EM | Admit: 2017-08-20 | Discharge: 2017-08-23 | DRG: 689 | Disposition: A | Discharge status: Skilled Nursing Facility | Payer: Medicare Other | Attending: Internal Medicine | Admitting: Internal Medicine

## 2017-08-20 DIAGNOSIS — M1990 Unspecified osteoarthritis, unspecified site: Secondary | ICD-10-CM | POA: Diagnosis present

## 2017-08-20 DIAGNOSIS — G92 Toxic encephalopathy: Secondary | ICD-10-CM | POA: Diagnosis present

## 2017-08-20 DIAGNOSIS — R1310 Dysphagia, unspecified: Secondary | ICD-10-CM | POA: Diagnosis present

## 2017-08-20 DIAGNOSIS — Z880 Allergy status to penicillin: Secondary | ICD-10-CM

## 2017-08-20 DIAGNOSIS — Z66 Do not resuscitate: Secondary | ICD-10-CM | POA: Diagnosis present

## 2017-08-20 DIAGNOSIS — G629 Polyneuropathy, unspecified: Secondary | ICD-10-CM | POA: Diagnosis present

## 2017-08-20 DIAGNOSIS — E860 Dehydration: Secondary | ICD-10-CM | POA: Diagnosis present

## 2017-08-20 DIAGNOSIS — Z23 Encounter for immunization: Secondary | ICD-10-CM

## 2017-08-20 DIAGNOSIS — Z888 Allergy status to other drugs, medicaments and biological substances status: Secondary | ICD-10-CM

## 2017-08-20 DIAGNOSIS — E039 Hypothyroidism, unspecified: Secondary | ICD-10-CM | POA: Diagnosis present

## 2017-08-20 DIAGNOSIS — F0280 Dementia in other diseases classified elsewhere without behavioral disturbance: Secondary | ICD-10-CM | POA: Diagnosis present

## 2017-08-20 DIAGNOSIS — Z8781 Personal history of (healed) traumatic fracture: Secondary | ICD-10-CM

## 2017-08-20 DIAGNOSIS — I129 Hypertensive chronic kidney disease with stage 1 through stage 4 chronic kidney disease, or unspecified chronic kidney disease: Secondary | ICD-10-CM | POA: Diagnosis present

## 2017-08-20 DIAGNOSIS — Z79899 Other long term (current) drug therapy: Secondary | ICD-10-CM | POA: Diagnosis not present

## 2017-08-20 DIAGNOSIS — N183 Chronic kidney disease, stage 3 (moderate): Secondary | ICD-10-CM | POA: Diagnosis present

## 2017-08-20 DIAGNOSIS — I10 Essential (primary) hypertension: Secondary | ICD-10-CM | POA: Diagnosis present

## 2017-08-20 DIAGNOSIS — G9340 Encephalopathy, unspecified: Secondary | ICD-10-CM | POA: Diagnosis not present

## 2017-08-20 DIAGNOSIS — E440 Moderate protein-calorie malnutrition: Secondary | ICD-10-CM | POA: Diagnosis present

## 2017-08-20 DIAGNOSIS — Z6824 Body mass index (BMI) 24.0-24.9, adult: Secondary | ICD-10-CM

## 2017-08-20 DIAGNOSIS — N179 Acute kidney failure, unspecified: Secondary | ICD-10-CM | POA: Diagnosis present

## 2017-08-20 DIAGNOSIS — E038 Other specified hypothyroidism: Secondary | ICD-10-CM

## 2017-08-20 DIAGNOSIS — N390 Urinary tract infection, site not specified: Secondary | ICD-10-CM | POA: Diagnosis not present

## 2017-08-20 DIAGNOSIS — F0390 Unspecified dementia without behavioral disturbance: Secondary | ICD-10-CM | POA: Diagnosis present

## 2017-08-20 DIAGNOSIS — Z96641 Presence of right artificial hip joint: Secondary | ICD-10-CM | POA: Diagnosis present

## 2017-08-20 LAB — URINALYSIS, COMPLETE (UACMP) WITH MICROSCOPIC
Bilirubin Urine: NEGATIVE
Glucose, UA: NEGATIVE mg/dL
Ketones, ur: 5 mg/dL — AB
NITRITE: POSITIVE — AB
Protein, ur: 30 mg/dL — AB
Specific Gravity, Urine: 1.013 (ref 1.005–1.030)
pH: 5 (ref 5.0–8.0)

## 2017-08-20 LAB — AMMONIA: AMMONIA: 11 umol/L (ref 9–35)

## 2017-08-20 LAB — CBC WITH DIFFERENTIAL/PLATELET
BASOS PCT: 0 %
Basophils Absolute: 0 10*3/uL (ref 0.0–0.1)
Eosinophils Absolute: 0.2 10*3/uL (ref 0.0–0.7)
Eosinophils Relative: 2 %
HCT: 39.8 % (ref 36.0–46.0)
HEMOGLOBIN: 12.4 g/dL (ref 12.0–15.0)
LYMPHS ABS: 1.1 10*3/uL (ref 0.7–4.0)
Lymphocytes Relative: 11 %
MCH: 28.8 pg (ref 26.0–34.0)
MCHC: 31.2 g/dL (ref 30.0–36.0)
MCV: 92.3 fL (ref 78.0–100.0)
MONOS PCT: 7 %
Monocytes Absolute: 0.8 10*3/uL (ref 0.1–1.0)
NEUTROS PCT: 80 %
Neutro Abs: 8.4 10*3/uL — ABNORMAL HIGH (ref 1.7–7.7)
Platelets: 366 10*3/uL (ref 150–400)
RBC: 4.31 MIL/uL (ref 3.87–5.11)
RDW: 14.4 % (ref 11.5–15.5)
WBC: 10.5 10*3/uL (ref 4.0–10.5)

## 2017-08-20 LAB — COMPREHENSIVE METABOLIC PANEL
ALT: 14 U/L (ref 14–54)
ANION GAP: 13 (ref 5–15)
AST: 22 U/L (ref 15–41)
Albumin: 3.4 g/dL — ABNORMAL LOW (ref 3.5–5.0)
Alkaline Phosphatase: 71 U/L (ref 38–126)
BUN: 46 mg/dL — ABNORMAL HIGH (ref 6–20)
CHLORIDE: 112 mmol/L — AB (ref 101–111)
CO2: 18 mmol/L — AB (ref 22–32)
Calcium: 9.1 mg/dL (ref 8.9–10.3)
Creatinine, Ser: 1.82 mg/dL — ABNORMAL HIGH (ref 0.44–1.00)
GFR calc non Af Amer: 23 mL/min — ABNORMAL LOW (ref >60)
GFR, EST AFRICAN AMERICAN: 26 mL/min — AB (ref >60)
Glucose, Bld: 108 mg/dL — ABNORMAL HIGH (ref 65–99)
Potassium: 4.7 mmol/L (ref 3.5–5.1)
SODIUM: 143 mmol/L (ref 135–145)
Total Bilirubin: 0.8 mg/dL (ref 0.3–1.2)
Total Protein: 6.5 g/dL (ref 6.5–8.1)

## 2017-08-20 LAB — I-STAT TROPONIN, ED: TROPONIN I, POC: 0.03 ng/mL (ref 0.00–0.08)

## 2017-08-20 LAB — ETHANOL: Alcohol, Ethyl (B): <10 mg/dL (ref <10)

## 2017-08-20 LAB — I-STAT CG4 LACTIC ACID, ED: Lactic Acid, Venous: 1.46 mmol/L (ref 0.5–1.9)

## 2017-08-20 MED ORDER — SODIUM CHLORIDE 0.9 % IV SOLN
INTRAVENOUS | Status: DC
  Administered 2017-08-20: 22:00:00 via INTRAVENOUS

## 2017-08-20 MED ORDER — SODIUM CHLORIDE 0.9 % IV SOLN: INTRAVENOUS | Status: DC

## 2017-08-20 MED ORDER — SODIUM CHLORIDE 0.9 % IV SOLN
1.0000 g | Freq: Once | INTRAVENOUS | Status: AC
  Administered 2017-08-20: 1 g via INTRAVENOUS
  Filled 2017-08-20: qty 10

## 2017-08-20 NOTE — ED Triage Notes (Signed)
Pt was found by her son at 4pm today after getting up from sleep, son reported to ems that pt normally goes to bed at 7pm and then wakes up the previous day in the afternoon, last time son saw pt was 7pm yesterday, son reported that pt did not respond to him, speech slurred, ems reports that pt speech is  "garbled" with them as well, pt not following commands upon arrival to er.

## 2017-08-20 NOTE — H&P (Signed)
TRH H&P    Patient Demographics:    [name], is a 84 y.o. female  MRN: [id number]  DOB - 1920-09-13  Admit Date - 08/20/2017  Referring MD/NP/PA: Eber Hong  Outpatient Primary MD for the patient is Butch Penny, MD (Inactive)  Patient coming from: Home  Chief complaint- Lethargy   HPI:    [name]  is a 84 y.o. female, with h/o dementia, hypertension, prior hip fracture, hypothyroidism, vertebral compression fracture was brought to the hospital for lethargy, poor po intake, altered mental status. Patient is usually significantly debilitated, walks with a walker. Over the past few days she has had poor po intake.  As per son there has been no history of vomiting or diarrhea.  Patient did not complain of chest pain or shortness of breath. In the ED patient was found to have abnormal UA and acute kidney injury. No other history is obtainable. Patient is unable to provide any history    Review of systems:      All other systems reviewed and are negative.   With Past History of the following :    Past Medical History:  Diagnosis Date  . Arthritis    right knee  . Fx intertrochanteric hip (HCC) 07/23/15  . Hiatal hernia   . Hypertension   . Hypothyroidism   . Peripheral neuropathy   . Schatzki's ring   . Shortness of breath   . Vertebral compression fracture [facility])       Past Surgical History:  Procedure Laterality Date  . ABDOMINAL HYSTERECTOMY  1977  . APPENDECTOMY  1941  . CHOLECYSTECTOMY  1967  . COLONOSCOPY  10/24/08   anal papilla otherwise normal, pan colonic diverticulum and colonic mucosa  . COLONOSCOPY  remote   Dr. Jerolyn Shin Smith-->polpys per patient  . CYSTOSCOPY WITH STENT PLACEMENT Bilateral 05/20/2014   Procedure: CYSTOSCOPY WITH BILATERAL DOUBLE J STENT PLACEMENT;  Surgeon: Chelsea Aus, MD;  Location: [facility];  Service: Urology;  Laterality: Bilateral;  . ERCP  1995     Dr. Rourk--> for dilated biliary tree. no stone or neoplasm or stricture found  . ESOPHAGOGASTRODUODENOSCOPY  10/24/08   normal, status post [facility] dilator, small hiatal hernia deformity of the antrum/pylorus  . ESOPHAGOGASTRODUODENOSCOPY  1995   Dr. Rourk--> empiric dilation of esophagus with resolution of dysphagia  . ESOPHAGOGASTRODUODENOSCOPY   09/21/2011   RMR: Noncritical Schatzki's ring s/p dilated/Abnormal esophageal mucosa/Small  hiatal hermia  . HEMORRHOID SURGERY  1999  . HIP ARTHROPLASTY Right 07/04/2014   Procedure: RIGHT PARTIAL HIP REPLACEMENT;  Surgeon: Vickki Hearing, MD;  Location: [facility];  Service: Orthopedics;  Laterality: Right;  . HIP FRACTURE SURGERY  left hip repair  . HOLMIUM LASER APPLICATION Bilateral 06/26/2014   Procedure: HOLMIUM LASER APPLICATION;  Surgeon: Chelsea Aus, MD;  Location: [facility];  Service: Urology;  Laterality: Bilateral;  . INTRAMEDULLARY (IM) NAIL INTERTROCHANTERIC Left 07/20/2015   Procedure: INTRAMEDULLARY (IM) NAIL INTERTROCHANTRIC LEFT HIP;  Surgeon: Kathryne Hitch, MD;  Location: [facility];  Service: Orthopedics;  Laterality:  Left;  . KNEE SURGERY  09/2008  . ORIF FEMUR FRACTURE Left 08/14/2015   Procedure: PLACEMENT OF LEFT DISTAL INTERLOCKING SCREW FEMUR;  Surgeon: Kathryne Hitch, MD;  Location: [facility];  Service: Orthopedics;  Laterality: Left;  . SHOULDER SURGERY    . SPINE SURGERY  1986      Social History:      Social History   Tobacco Use  . Smoking status: Never Smoker  . Smokeless tobacco: Never Used  Substance Use Topics  . Alcohol use: No    Alcohol/week: 0.0 oz       Family History :     Family History  Problem Relation Age of Onset  . Heart attack Father 39       deceased      Home Medications:   Prior to Admission medications   Medication Sig Start Date End Date Taking? Authorizing Provider  CRANBERRY FRUIT PO Take 1 capsule by mouth daily.   Yes [provider]   gabapentin (NEURONTIN) 300 MG capsule Take 1 capsule by mouth 3 (three) times daily. 07/14/15  Yes [provider]  levothyroxine (SYNTHROID, LEVOTHROID) 137 MCG tablet Take 137 mcg by mouth daily before breakfast.    Yes [provider]     Allergies:     Allergies  Allergen Reactions  . Lactulose     REACTION: unknown reaction  . Penicillins     REACTION: unknown reaction  . Prilosec [Omeprazole] Itching and Rash     Physical Exam:   Vitals  Blood pressure 108/77, pulse (!) 107, temperature (!) 97.5 F (36.4 C), temperature source Rectal, resp. rate (!) 22, height 5' (1.524 m), weight 61.2 kg (135 lb), SpO2 97 %.  1.  General: Appears lethargic  2. Psychiatric: Not tested  3. Neurologic: Somnolent but arousable, moving all extremities  4. Eyes :  anicteric sclerae, moist conjunctivae with no lid lag.   5. ENMT:  Oropharynx clear with moist mucous membranes  6. Neck:  supple, no cervical lymphadenopathy appriciated, No thyromegaly  7. Respiratory : Normal respiratory effort, good air movement bilaterally,clear to  auscultation bilaterally  8. Cardiovascular : RRR, no gallops, rubs or murmurs, no leg edema  9. Gastrointestinal:  Positive bowel sounds, abdomen soft, non-tender to palpation,no hepatosplenomegaly, no rigidity or guarding       10. Skin:  Superficial skin tears noted at the right elbow  11.Musculoskeletal:  Good muscle tone,  joints appear normal , no effusions,  normal range of motion    Data Review:    CBC Recent Labs  Lab 08/20/17 2055  WBC 10.5  HGB 12.4  HCT 39.8  PLT 366  MCV 92.3  MCH 28.8  MCHC 31.2  RDW 14.4  LYMPHSABS 1.1  MONOABS 0.8  EOSABS 0.2  BASOSABS 0.0   ------------------------------------------------------------------------------------------------------------------  Chemistries  Recent Labs  Lab 08/20/17 2055  NA 143  K 4.7  CL 112*  CO2 18*  GLUCOSE 108*  BUN 46*  CREATININE  1.82*  CALCIUM 9.1  AST 22  ALT 14  ALKPHOS 71  BILITOT 0.8   ------------------------------------------------------------------------------------------------------------------  ------------------------------------------------------------------------------------------------------------------ GFR: Estimated Creatinine Clearance: 15.5 mL/min (A) (by C-G formula based on SCr of 1.82 mg/dL (H)). Liver Function Tests: Recent Labs  Lab 08/20/17 2055  AST 22  ALT 14  ALKPHOS 71  BILITOT 0.8  PROT 6.5  ALBUMIN 3.4*   No results for input(s): LIPASE, AMYLASE in the last 168 hours. Recent Labs  Lab 08/20/17 2056  AMMONIA 11    --------------------------------------------------------------------------------------------------------------- Urine analysis:    Component Value Date/Time   COLORURINE YELLOW 08/20/2017 2140   APPEARANCEUR CLOUDY (A) 08/20/2017 2140   LABSPEC 1.013 08/20/2017 2140   PHURINE 5.0 08/20/2017 2140   GLUCOSEU NEGATIVE 08/20/2017 2140   HGBUR SMALL (A) 08/20/2017 2140   BILIRUBINUR NEGATIVE 08/20/2017 2140   KETONESUR 5 (A) 08/20/2017 2140   PROTEINUR 30 (A) 08/20/2017 2140   UROBILINOGEN 0.2 07/03/2014 0315   NITRITE POSITIVE (A) 08/20/2017 2140   LEUKOCYTESUR TRACE (A) 08/20/2017 2140      Imaging Results:    Ct Head Wo Contrast  Result Date: 08/20/2017 CLINICAL DATA:  Pt was found by her son at 4pm today after getting up from sleep, son reported to ems that pt normally goes to bed at 7pm and then wakes up the previous day in the afternoon, last time son saw pt was 7pm yesterday, son reported that pt did not respond to him, speech slurred, ems reports that pt speech is "garbled" with them as well, pt not following commands upon arrival to er. EXAM: CT HEAD WITHOUT CONTRAST TECHNIQUE: Contiguous axial images were obtained from the base of the skull through the vertex without intravenous contrast. COMPARISON:  07/19/2015 FINDINGS: Brain: No evidence of  acute infarction, hemorrhage, hydrocephalus, extra-axial collection or mass lesion/mass effect. There is ventricular and sulcal enlargement reflecting moderate to advanced atrophy. Periventricular white matter hypoattenuation is also noted consistent with mild chronic microvascular ischemic change. Vascular: No hyperdense vessel or unexpected calcification. Skull: Normal. Negative for fracture or focal lesion. Sinuses/Orbits: Globes and orbits are unremarkable. Mucosal thickening mostly opacifies posterior ethmoid air cells on the right. Remaining sinuses are clear. Clear mastoid air cells. Other: None. IMPRESSION: 1. No acute intracranial abnormalities. 2. Moderate to advanced atrophy and mild chronic microvascular ischemic change. Electronically Signed   By: Amie Portland M.D.   On: 08/20/2017 21:36   Dg Chest Port 1 View  Result Date: 08/20/2017 CLINICAL DATA:  Pt was found by her son at 4pm today after getting up from sleep, son reported to ems that pt normally goes to bed at 7pm and then wakes up the previous day in the afternoon, last time son saw pt was 7pm yesterday, son reported that pt did not respond to him, speech slurred, ems reports that pt speech is "garbled" with them as well, pt not following commands upon arrival to er. EXAM: PORTABLE CHEST 1 VIEW COMPARISON:  08/14/2015 FINDINGS: Cardiac silhouette is normal in size. No mediastinal or hilar masses. Lungs are clear. No convincing pleural effusion. No pneumothorax. Skeletal structures are demineralized but grossly intact. IMPRESSION: No acute cardiopulmonary disease. Electronically Signed   By: Amie Portland M.D.   On: 08/20/2017 21:37    My personal review of EKG: Rhythm NSR   Assessment & Plan:    Active Problems:   Hypothyroidism   Hypertension   Acute renal failure (HCC)   UTI (urinary tract infection)   1. UTI-patient has abnormal UA, started on ceftriaxone per pharmacy.  Follow urine culture results. 2. Acute kidney  injury-patient's baseline BUN/creatinine is 41/1.38, today BUN/creatinine is 46/1.82, start IV normal saline at 100 mL/h.  Follow BMP in a.m. 3. Altered mental status-likely from above, ammonia level is 11, CT head showed no acute abnormality.  Started on IV antibiotics and IV fluids as above.  Will follow. 4. Hypothyroidism-we will check TSH, continue Synthroid    DVT Prophylaxis-   Lovenox   AM Labs Ordered, also  please review Full Orders  Family Communication: Admission, patients condition and plan of care including tests being ordered have been discussed with the patient's son at bedside who indicate understanding and agree with the plan and Code Status.  Code Status:  DNR  Admission status: Inpatient  Time spent in minutes : 60 min   Meredeth IdeGagan S Nelva Hauk M.D on 08/20/2017 at 11:39 PM  Between 7am to 7pm - Pager - [phone]. After 7pm go to [email or web address] - password [email or web address]  [facility] - Office  [phone]

## 2017-08-20 NOTE — ED Notes (Signed)
Upon arrival patient was incontinent of urine and stool, dried stool noted to toes and bottom of feet.  Per son, patient fell out of bed a couple weeks ago, old bruising noted to right side of forehead and upper right lip, old scratches to both side of back, skin tear to left and right forearm, redness and drainage noted to abdominal folds, redness noted to right and left sacrum area, with blanching to left hip, bruising and skin tears to both lower legs.

## 2017-08-20 NOTE — ED Provider Notes (Signed)
[facility] EMERGENCY DEPARTMENT Provider Note   CSN: [id number] Arrival date & time: 08/20/17  2011     History   Chief Complaint Chief Complaint  Patient presents with  . Aphasia    HPI [name] is a 84 y.o. female.  HPI  The patient is a 84 year old female, history of dementia according to the son who is the primary historian, history of hypertension, prior hip fracture, hypothyroidism, vertebral compression fractures and according to the son she is usually significantly debilitated requiring significant assistance to get around the house including a walker and someone helping her.  The patient was found by paramedics when the call went out to [phone] unresponsive.  According to the son who is not a great historian, his mother has had several days of a gradual decline including decreased level of consciousness, decreased interaction and 2 days of sleeping most of the day.  There is been very little to eat or drink, paramedics reported that the patient was found with crusted stool on her feet, smelling of foul urine.  The patient is unable to give any history, level 5 caveat applies.  Past Medical History:  Diagnosis Date  . Arthritis    right knee  . Fx intertrochanteric hip (HCC) 07/23/15  . Hiatal hernia   . Hypertension   . Hypothyroidism   . Peripheral neuropathy   . Schatzki's ring   . Shortness of breath   . Vertebral compression fracture [facility])     Patient Active Problem List   Diagnosis Date Noted  . Intertrochanteric fracture of left femur (HCC) 08/13/2015  . Left displaced femoral neck fracture (HCC) 08/13/2015  . Hyperkalemia 07/20/2015  . CKD (chronic kidney disease) stage 3, GFR 30-59 ml/min (HCC) 07/20/2015  . Closed nondisplaced intertrochanteric fracture of left femur (HCC)   . Right ureteral calculus 06/26/2014  . Acute renal failure (HCC) 05/20/2014  . Ureteral stone with hydronephrosis 05/20/2014  . Hyperglycemia 05/20/2014  . Renal atrophy  05/20/2014  . Leukocytosis 05/20/2014  . Hypertension   . UTI (lower urinary tract infection) 11/09/2012  . Delirium superimposed on dementia 11/09/2012  . Esophageal dysphagia 09/06/2011  . Hoarseness 09/06/2011  . Hypothyroidism 10/10/2008    Past Surgical History:  Procedure Laterality Date  . ABDOMINAL HYSTERECTOMY  1977  . APPENDECTOMY  1941  . CHOLECYSTECTOMY  1967  . COLONOSCOPY  10/24/08   anal papilla otherwise normal, pan colonic diverticulum and colonic mucosa  . COLONOSCOPY  remote   Dr. Jerolyn Shin Smith-->polpys per patient  . CYSTOSCOPY WITH STENT PLACEMENT Bilateral 05/20/2014   Procedure: CYSTOSCOPY WITH BILATERAL DOUBLE J STENT PLACEMENT;  Surgeon: Chelsea Aus, MD;  Location: [facility];  Service: Urology;  Laterality: Bilateral;  . ERCP  1995   Dr. Rourk--> for dilated biliary tree. no stone or neoplasm or stricture found  . ESOPHAGOGASTRODUODENOSCOPY  10/24/08   normal, status post [facility] dilator, small hiatal hernia deformity of the antrum/pylorus  . ESOPHAGOGASTRODUODENOSCOPY  1995   Dr. Rourk--> empiric dilation of esophagus with resolution of dysphagia  . ESOPHAGOGASTRODUODENOSCOPY   09/21/2011   RMR: Noncritical Schatzki's ring s/p dilated/Abnormal esophageal mucosa/Small  hiatal hermia  . HEMORRHOID SURGERY  1999  . HIP ARTHROPLASTY Right 07/04/2014   Procedure: RIGHT PARTIAL HIP REPLACEMENT;  Surgeon: Vickki Hearing, MD;  Location: [facility];  Service: Orthopedics;  Laterality: Right;  . HIP FRACTURE SURGERY  left hip repair  . HOLMIUM LASER APPLICATION Bilateral 06/26/2014   Procedure: HOLMIUM LASER APPLICATION;  Surgeon:  Chelsea Aus, MD;  Location: [facility];  Service: Urology;  Laterality: Bilateral;  . INTRAMEDULLARY (IM) NAIL INTERTROCHANTERIC Left 07/20/2015   Procedure: INTRAMEDULLARY (IM) NAIL INTERTROCHANTRIC LEFT HIP;  Surgeon: Kathryne Hitch, MD;  Location: [facility];  Service: Orthopedics;  Laterality: Left;  . KNEE SURGERY  09/2008  .  ORIF FEMUR FRACTURE Left 08/14/2015   Procedure: PLACEMENT OF LEFT DISTAL INTERLOCKING SCREW FEMUR;  Surgeon: Kathryne Hitch, MD;  Location: [facility];  Service: Orthopedics;  Laterality: Left;  . SHOULDER SURGERY    . SPINE SURGERY  1986    OB History    No data available       Home Medications    Prior to Admission medications   Medication Sig Start Date End Date Taking? Authorizing Provider  CRANBERRY FRUIT PO Take 1 capsule by mouth daily.   Yes [provider]  gabapentin (NEURONTIN) 300 MG capsule Take 1 capsule by mouth 3 (three) times daily. 07/14/15  Yes [provider]  levothyroxine (SYNTHROID, LEVOTHROID) 137 MCG tablet Take 137 mcg by mouth daily before breakfast.    Yes [provider]    Family History Family History  Problem Relation Age of Onset  . Heart attack Father 38       deceased    Social History Social History   Tobacco Use  . Smoking status: Never Smoker  . Smokeless tobacco: Never Used  Substance Use Topics  . Alcohol use: No    Alcohol/week: 0.0 oz  . Drug use: No     Allergies   Lactulose; Penicillins; and Prilosec [omeprazole]   Review of Systems Review of Systems  Unable to perform ROS: Mental status change     Physical Exam Updated Vital Signs BP 108/77 (BP Location: Left Arm)   Pulse (!) 107   Temp (!) 97.5 F (36.4 C) (Rectal)   Resp (!) 22   Ht 5' (1.524 m)   Wt 61.2 kg (135 lb)   SpO2 97%   BMI 26.37 kg/m   Physical Exam  Constitutional: She appears distressed.  Altered mental status, confused, does not answer questions mumbling  HENT:  Head: Normocephalic and atraumatic.  Mouth/Throat: Oropharynx is clear and moist. No oropharyngeal exudate.  Eyes: Conjunctivae and EOM are normal. Pupils are equal, round, and reactive to light. Right eye exhibits no discharge. Left eye exhibits no discharge. No scleral icterus.  Neck: Normal range of motion. Neck supple. No JVD present. No  thyromegaly present.  Cardiovascular: Normal rate, regular rhythm and intact distal pulses. Exam reveals no gallop and no friction rub.  Murmur heard. Pulmonary/Chest: Effort normal and breath sounds normal. No respiratory distress. She has no wheezes. She has no rales.  Abdominal: Soft. Bowel sounds are normal. She exhibits no distension and no mass. There is no tenderness.  Musculoskeletal: Normal range of motion. She exhibits no edema or tenderness.  Lymphadenopathy:    She has no cervical adenopathy.  Neurological:  The patient lays with her eyes closed, she has some flexion contractures, she is very stiff all over, she does not follow commands very well, when she speaks it is just mumbling, nonsensical  Skin: Skin is warm and dry. Rash noted. No erythema.  Multiple skin tears across the body in multiple areas, early sacral decubitus ulcer  Nursing note and vitals reviewed.    ED Treatments / Results  Labs (all labs ordered are listed, but only abnormal results are displayed) Labs Reviewed  COMPREHENSIVE METABOLIC PANEL -  Abnormal; Notable for the following components:      Result Value   Chloride 112 (*)    CO2 18 (*)    Glucose, Bld 108 (*)    BUN 46 (*)    Creatinine, Ser 1.82 (*)    Albumin 3.4 (*)    GFR calc non Af Amer 23 (*)    GFR calc Af Amer 26 (*)    All other components within normal limits  CBC WITH DIFFERENTIAL/PLATELET - Abnormal; Notable for the following components:   Neutro Abs 8.4 (*)    All other components within normal limits  URINALYSIS, COMPLETE (UACMP) WITH MICROSCOPIC - Abnormal; Notable for the following components:   APPearance CLOUDY (*)    Hgb urine dipstick SMALL (*)    Ketones, ur 5 (*)    Protein, ur 30 (*)    Nitrite POSITIVE (*)    Leukocytes, UA TRACE (*)    Bacteria, UA MANY (*)    Squamous Epithelial / LPF 6-30 (*)    All other components within normal limits  URINE CULTURE  AMMONIA  ETHANOL  CBG MONITORING, ED  I-STAT CG4  LACTIC ACID, ED  I-STAT TROPONIN, ED    EKG  EKG Interpretation  Date/Time:  Sunday August 20 2017 20:33:21 EDT Ventricular Rate:  93 PR Interval:    QRS Duration: 91 QT Interval:  390 QTC Calculation: 488 R Axis:   -40 Text Interpretation:  Sinus or ectopic atrial rhythm Inferior infarct, old Anterior infarct, old Left axis deviation Poor R wave progression Abnormal ekg Since last tracing Poor R wave progression now more prominent Confirmed by Eber Hong ([id number]) on 08/20/2017 8:42:16 PM       Radiology Ct Head Wo Contrast  Result Date: 08/20/2017 CLINICAL DATA:  Pt was found by her son at 4pm today after getting up from sleep, son reported to ems that pt normally goes to bed at 7pm and then wakes up the previous day in the afternoon, last time son saw pt was 7pm yesterday, son reported that pt did not respond to him, speech slurred, ems reports that pt speech is "garbled" with them as well, pt not following commands upon arrival to er. EXAM: CT HEAD WITHOUT CONTRAST TECHNIQUE: Contiguous axial images were obtained from the base of the skull through the vertex without intravenous contrast. COMPARISON:  07/19/2015 FINDINGS: Brain: No evidence of acute infarction, hemorrhage, hydrocephalus, extra-axial collection or mass lesion/mass effect. There is ventricular and sulcal enlargement reflecting moderate to advanced atrophy. Periventricular white matter hypoattenuation is also noted consistent with mild chronic microvascular ischemic change. Vascular: No hyperdense vessel or unexpected calcification. Skull: Normal. Negative for fracture or focal lesion. Sinuses/Orbits: Globes and orbits are unremarkable. Mucosal thickening mostly opacifies posterior ethmoid air cells on the right. Remaining sinuses are clear. Clear mastoid air cells. Other: None. IMPRESSION: 1. No acute intracranial abnormalities. 2. Moderate to advanced atrophy and mild chronic microvascular ischemic change. Electronically  Signed   By: Amie Portland M.D.   On: 08/20/2017 21:36   Dg Chest Port 1 View  Result Date: 08/20/2017 CLINICAL DATA:  Pt was found by her son at 4pm today after getting up from sleep, son reported to ems that pt normally goes to bed at 7pm and then wakes up the previous day in the afternoon, last time son saw pt was 7pm yesterday, son reported that pt did not respond to him, speech slurred, ems reports that pt speech is "garbled" with them as well, pt not  following commands upon arrival to er. EXAM: PORTABLE CHEST 1 VIEW COMPARISON:  08/14/2015 FINDINGS: Cardiac silhouette is normal in size. No mediastinal or hilar masses. Lungs are clear. No convincing pleural effusion. No pneumothorax. Skeletal structures are demineralized but grossly intact. IMPRESSION: No acute cardiopulmonary disease. Electronically Signed   By: Amie Portland M.D.   On: 08/20/2017 21:37    Procedures Procedures (including critical care time)  Medications Ordered in ED Medications  0.9 %  sodium chloride infusion ( Intravenous New Bag/Given 08/20/17 2202)  0.9 %  sodium chloride infusion (not administered)  cefTRIAXone (ROCEPHIN) 1 g in sodium chloride 0.9 % 100 mL IVPB (not administered)     Initial Impression / Assessment and Plan / ED Course  I have reviewed the triage vital signs and the nursing notes.  Pertinent labs & imaging results that were available during my care of the patient were reviewed by me and considered in my medical decision making (see chart for details).    Altered mental status of unclear etiology  The patient has had a significant failure to thrive and does not appear to be getting adequate care in the home environment where she currently lives  At the minimum the patient has malnutrition, this could also be related to electrolyte abnormalities, hyponatremia or volume contracture, urinary tract infection, pneumonia, stroke, liver failure or any other number of significant abnormalities.  She  is not tachycardic hypotensive or febrile   CT head negative  CXR negative for pneumonia  CBC without leukocytosis  BMP shows AKI - Cr from 1.4 - 1.8  UA shows likley UTI   Antibiotics ordered - Rocephin Fluids, Son states thinks she had a rash with PCN. Consult to hospitalist for admission    Final Clinical Impressions(s) / ED Diagnoses   Final diagnoses:  Urinary tract infection without hematuria, site unspecified  Acute encephalopathy    ED Discharge Orders    None       Eber Hong, MD 08/20/17 2309

## 2017-08-21 LAB — COMPREHENSIVE METABOLIC PANEL
ALT: 12 U/L — AB (ref 14–54)
AST: 22 U/L (ref 15–41)
Albumin: 3.2 g/dL — ABNORMAL LOW (ref 3.5–5.0)
Alkaline Phosphatase: 62 U/L (ref 38–126)
Anion gap: 13 (ref 5–15)
BILIRUBIN TOTAL: 0.9 mg/dL (ref 0.3–1.2)
BUN: 45 mg/dL — AB (ref 6–20)
CALCIUM: 8.8 mg/dL — AB (ref 8.9–10.3)
CO2: 18 mmol/L — ABNORMAL LOW (ref 22–32)
CREATININE: 1.65 mg/dL — AB (ref 0.44–1.00)
Chloride: 115 mmol/L — ABNORMAL HIGH (ref 101–111)
GFR, EST AFRICAN AMERICAN: 30 mL/min — AB (ref >60)
GFR, EST NON AFRICAN AMERICAN: 25 mL/min — AB (ref >60)
Glucose, Bld: 89 mg/dL (ref 65–99)
Potassium: 4.7 mmol/L (ref 3.5–5.1)
Sodium: 146 mmol/L — ABNORMAL HIGH (ref 135–145)
TOTAL PROTEIN: 6.2 g/dL — AB (ref 6.5–8.1)

## 2017-08-21 LAB — CBC
HEMATOCRIT: 38.8 % (ref 36.0–46.0)
Hemoglobin: 12.1 g/dL (ref 12.0–15.0)
MCH: 28.9 pg (ref 26.0–34.0)
MCHC: 31.2 g/dL (ref 30.0–36.0)
MCV: 92.8 fL (ref 78.0–100.0)
PLATELETS: 255 10*3/uL (ref 150–400)
RBC: 4.18 MIL/uL (ref 3.87–5.11)
RDW: 14.6 % (ref 11.5–15.5)
WBC: 9.1 10*3/uL (ref 4.0–10.5)

## 2017-08-21 LAB — TSH: TSH: 3.263 u[IU]/mL (ref 0.350–4.500)

## 2017-08-21 MED ORDER — ENOXAPARIN SODIUM 30 MG/0.3ML ~~LOC~~ SOLN
30.0000 mg | SUBCUTANEOUS | Status: DC
  Administered 2017-08-21 – 2017-08-23 (×3): 30 mg via SUBCUTANEOUS
  Filled 2017-08-21 (×3): qty 0.3

## 2017-08-21 MED ORDER — LEVOTHYROXINE SODIUM 137 MCG PO TABS
137.0000 ug | ORAL_TABLET | Freq: Every day | ORAL | Status: DC
  Filled 2017-08-21 (×3): qty 1

## 2017-08-21 MED ORDER — ACETAMINOPHEN 650 MG RE SUPP: 650.0000 mg | Freq: Four times a day (QID) | RECTAL | Status: DC | PRN

## 2017-08-21 MED ORDER — GABAPENTIN 300 MG PO CAPS
300.0000 mg | ORAL_CAPSULE | Freq: Three times a day (TID) | ORAL | Status: DC
  Filled 2017-08-21: qty 1

## 2017-08-21 MED ORDER — NYSTATIN 100000 UNIT/GM EX POWD
Freq: Three times a day (TID) | CUTANEOUS | Status: DC
  Administered 2017-08-22 – 2017-08-23 (×5): via TOPICAL
  Filled 2017-08-21 (×3): qty 15

## 2017-08-21 MED ORDER — SODIUM CHLORIDE 0.9 % IV SOLN
1.0000 g | INTRAVENOUS | Status: DC
  Administered 2017-08-21 – 2017-08-22 (×2): 1 g via INTRAVENOUS
  Filled 2017-08-21: qty 1
  Filled 2017-08-21: qty 10
  Filled 2017-08-21: qty 1

## 2017-08-21 MED ORDER — SODIUM CHLORIDE 0.9 % IV SOLN
INTRAVENOUS | Status: DC
  Administered 2017-08-21: 02:00:00 via INTRAVENOUS

## 2017-08-21 MED ORDER — DEXTROSE-NACL 5-0.45 % IV SOLN
INTRAVENOUS | Status: DC
  Administered 2017-08-21 – 2017-08-22 (×2): via INTRAVENOUS
  Filled 2017-08-21 (×4): qty 1000

## 2017-08-21 MED ORDER — LEVOTHYROXINE SODIUM 100 MCG IV SOLR
68.0000 ug | Freq: Every day | INTRAVENOUS | Status: DC
  Administered 2017-08-21 – 2017-08-22 (×2): 68 ug via INTRAVENOUS
  Filled 2017-08-21 (×3): qty 5

## 2017-08-21 MED ORDER — ONDANSETRON HCL 4 MG PO TABS: 4.0000 mg | ORAL_TABLET | Freq: Four times a day (QID) | ORAL | Status: DC | PRN

## 2017-08-21 MED ORDER — INFLUENZA VAC SPLIT HIGH-DOSE 0.5 ML IM SUSY: 0.5000 mL | PREFILLED_SYRINGE | INTRAMUSCULAR | Status: DC

## 2017-08-21 MED ORDER — FAMOTIDINE IN NACL 20-0.9 MG/50ML-% IV SOLN
20.0000 mg | INTRAVENOUS | Status: DC
  Administered 2017-08-21: 20 mg via INTRAVENOUS
  Filled 2017-08-21: qty 50

## 2017-08-21 MED ORDER — ENOXAPARIN SODIUM 40 MG/0.4ML ~~LOC~~ SOLN: 40.0000 mg | SUBCUTANEOUS | Status: DC

## 2017-08-21 MED ORDER — ACETAMINOPHEN 325 MG PO TABS: 650.0000 mg | ORAL_TABLET | Freq: Four times a day (QID) | ORAL | Status: DC | PRN

## 2017-08-21 MED ORDER — ONDANSETRON HCL 4 MG/2ML IJ SOLN: 4.0000 mg | Freq: Four times a day (QID) | INTRAMUSCULAR | Status: DC | PRN

## 2017-08-21 NOTE — Progress Notes (Signed)
Per monitor tech, patient had some beats of IVR which were non sustained. [place] notified and information passed to on coming nurse during shift report.

## 2017-08-21 NOTE — Progress Notes (Signed)
[facility] PROGRESS NOTE  [name] ZOX:[id number] DOB: Feb 06, 1921 DOA: 08/20/2017 PCP: Butch Penny, MD (Inactive)  Interim summary and HPI 84 y.o. female, with h/o dementia, hypertension, prior hip fracture, hypothyroidism, vertebral compression fracture was brought to the hospital for lethargy, poor po intake, altered mental status. Patient is usually debilitated, walks with a walker. Over the past few days she has had poor po intake unable to get out of bed.  Found to have dehydration and acute UTI.  Assessment/Plan: 1-acute toxic encephalopathy superimposed on chronic dementia: -Continue supportive care, IV fluids and current antibiotics -Unknown baseline -Currently very lethargic and unable to follow commands or take any pills by mouth. -base on clinical progression might need to have discussion with palliative care.  2-hypothyroidism -continue synthroid  -switched to IV currently, due to inability to take PO  3-AKI -in the setting of dehydration and pre-renal azotemia -continue IVF's -minimize use of nephrotoxic agents   4-UTI -continue current antibiotics  -follow culture results   5-physical deconditioning.dysphagia -PT/SPL to assess patient once awake and able to participate    Code Status: DNR Family Communication: Son at bedside  Disposition Plan: Continue IV fluids, switch medications to IV, continue current antibiotics and follow culture results. PT and speech therapy once more alert and able to participate. Might need SNF at discharge   Consultants:  None  Procedures:  See below for x-ray reports  Antibiotics:  Rocephin 3/10  HPI/Subjective: Lethargic/obtunded, unable to follow commands or safely swallow pills at this moment.  Objective: Vitals:   08/21/17 0154 08/21/17 0619  BP: (!) 111/47 (!) 142/48  Pulse: 77 72  Resp: 17 16  Temp: 97.6 F (36.4 C) (!) 97.5 F (36.4 C)  SpO2: 99% 100%    Intake/Output Summary (Last 24  hours) at 08/21/2017 1650 Last data filed at 08/21/2017 1505 Gross per 24 hour  Intake 1373.33 ml  Output -  Net 1373.33 ml   Filed Weights   08/20/17 2027 08/21/17 0154  Weight: 61.2 kg (135 lb) 58 kg (127 lb 13.9 oz)    Exam:   General: Lethargic, no able to follow commands or provide any history during my assessment; just moaning when trying to be moved or to painful stimuli.  Cardiovascular: S1 and S2, no rubs, no gallops  Respiratory: Scattered rhonchi, no wheezing, no crackles  Abdomen: Soft, nontender, nondistended, positive bowel sounds  Musculoskeletal: No cyanosis, no clubbing.  Data Reviewed: Basic Metabolic Panel: Recent Labs  Lab 08/20/17 2055 08/21/17 0617  NA 143 146*  K 4.7 4.7  CL 112* 115*  CO2 18* 18*  GLUCOSE 108* 89  BUN 46* 45*  CREATININE 1.82* 1.65*  CALCIUM 9.1 8.8*   Liver Function Tests: Recent Labs  Lab 08/20/17 2055 08/21/17 0617  AST 22 22  ALT 14 12*  ALKPHOS 71 62  BILITOT 0.8 0.9  PROT 6.5 6.2*  ALBUMIN 3.4* 3.2*    Recent Labs  Lab 08/20/17 2056  AMMONIA 11   CBC: Recent Labs  Lab 08/20/17 2055 08/21/17 0617  WBC 10.5 9.1  NEUTROABS 8.4*  --   HGB 12.4 12.1  HCT 39.8 38.8  MCV 92.3 92.8  PLT 366 255    Studies: Ct Head Wo Contrast  Result Date: 08/20/2017 CLINICAL DATA:  Pt was found by her son at 4pm today after getting up from sleep, son reported to ems that pt normally goes to bed at 7pm and then wakes up the previous day in the afternoon, last time  son saw pt was 7pm yesterday, son reported that pt did not respond to him, speech slurred, ems reports that pt speech is "garbled" with them as well, pt not following commands upon arrival to er. EXAM: CT HEAD WITHOUT CONTRAST TECHNIQUE: Contiguous axial images were obtained from the base of the skull through the vertex without intravenous contrast. COMPARISON:  07/19/2015 FINDINGS: Brain: No evidence of acute infarction, hemorrhage, hydrocephalus, extra-axial  collection or mass lesion/mass effect. There is ventricular and sulcal enlargement reflecting moderate to advanced atrophy. Periventricular white matter hypoattenuation is also noted consistent with mild chronic microvascular ischemic change. Vascular: No hyperdense vessel or unexpected calcification. Skull: Normal. Negative for fracture or focal lesion. Sinuses/Orbits: Globes and orbits are unremarkable. Mucosal thickening mostly opacifies posterior ethmoid air cells on the right. Remaining sinuses are clear. Clear mastoid air cells. Other: None. IMPRESSION: 1. No acute intracranial abnormalities. 2. Moderate to advanced atrophy and mild chronic microvascular ischemic change. Electronically Signed   By: Amie Portlandavid  Ormond M.D.   On: 08/20/2017 21:36   Dg Chest Port 1 View  Result Date: 08/20/2017 CLINICAL DATA:  Pt was found by her son at 4pm today after getting up from sleep, son reported to ems that pt normally goes to bed at 7pm and then wakes up the previous day in the afternoon, last time son saw pt was 7pm yesterday, son reported that pt did not respond to him, speech slurred, ems reports that pt speech is "garbled" with them as well, pt not following commands upon arrival to er. EXAM: PORTABLE CHEST 1 VIEW COMPARISON:  08/14/2015 FINDINGS: Cardiac silhouette is normal in size. No mediastinal or hilar masses. Lungs are clear. No convincing pleural effusion. No pneumothorax. Skeletal structures are demineralized but grossly intact. IMPRESSION: No acute cardiopulmonary disease. Electronically Signed   By: Amie Portlandavid  Ormond M.D.   On: 08/20/2017 21:37    Scheduled Meds: . enoxaparin (LOVENOX) injection  30 mg Subcutaneous Q24H  . [START ON 08/22/2017] Influenza vac split quadrivalent PF  0.5 mL Intramuscular Tomorrow-1000  . levothyroxine  68 mcg Intravenous Daily   Continuous Infusions: . sodium chloride 100 mL/hr at 08/21/17 0151  . cefTRIAXone (ROCEPHIN)  IV    . famotidine (PEPCID) IV      Time  spent: 25 minutes   Vassie Lollarlos Correen Bubolz  MD [facility] Pager [phone]. If 7PM-7AM, please contact night-coverage at [email or web address], password [email or web address] 08/21/2017, 4:50 PM  LOS: 1 day

## 2017-08-21 NOTE — Progress Notes (Signed)
Pharmacy Antibiotic Note  [name] is a [age] y.o. female admitted on 08/20/2017 with UTI.  Pharmacy has been consulted for Ceftriaxone dosing.  Plan: Ceftriaxone 1gm IV q24h F/U cxs and clinical progress Monitor V/S, labs  Height: 5' (152.4 cm) Weight: 127 lb 13.9 oz (58 kg) IBW/kg (Calculated) : 45.5  Temp (24hrs), Avg:97.5 F (36.4 C), Min:97.5 F (36.4 C), Max:97.6 F (36.4 C)  Recent Labs  Lab 08/20/17 2055 08/20/17 2115 08/21/17 0617  WBC 10.5  --  9.1  CREATININE 1.82*  --  1.65*  LATICACIDVEN  --  1.46  --     Estimated Creatinine Clearance: 16.6 mL/min (A) (by C-G formula based on SCr of 1.65 mg/dL (H)).    Allergies  Allergen Reactions  . Lactulose     REACTION: unknown reaction  . Penicillins     REACTION: unknown reaction Patient tolerated ceftriaxone 08/20/2017  . Prilosec [Omeprazole] Itching and Rash    Antimicrobials this admission: Ceftriaxone 3/10>>   Microbiology results: 3/10 UCx: pending   Thank you for allowing pharmacy to be a part of this patient's care.  Elder CyphersLorie Margret Moat, BS Pharm D, [place] Clinical Pharmacist Pager [phone] 08/21/2017 7:53 AM

## 2017-08-21 NOTE — Progress Notes (Signed)
SLP Cancellation Note  Patient Details Name: [name] MRN: [id number] DOB: 1920/09/08   Cancelled treatment:       Reason Eval/Treat Not Completed: Fatigue/lethargy limiting ability to participate;Patient's level of consciousness; Pt would not awaken sufficiently for BSE despite bed repositioning, wash cloth to face, and oral care completion. [name] reports that Pt typically sleeps until noon on most days lately. SLP will check back tomorrow.   Thank you,  Havery MorosDabney Lawson Isabell, CCC-SLP [phone]    Tarick Parenteau 08/21/2017, 2:57 PM

## 2017-08-22 DIAGNOSIS — E440 Moderate protein-calorie malnutrition: Secondary | ICD-10-CM

## 2017-08-22 LAB — CBC
HEMATOCRIT: 38.9 % (ref 36.0–46.0)
HEMOGLOBIN: 11.8 g/dL — AB (ref 12.0–15.0)
MCH: 28.2 pg (ref 26.0–34.0)
MCHC: 30.3 g/dL (ref 30.0–36.0)
MCV: 93.1 fL (ref 78.0–100.0)
Platelets: 332 10*3/uL (ref 150–400)
RBC: 4.18 MIL/uL (ref 3.87–5.11)
RDW: 14.6 % (ref 11.5–15.5)
WBC: 7.6 10*3/uL (ref 4.0–10.5)

## 2017-08-22 LAB — BASIC METABOLIC PANEL
Anion gap: 11 (ref 5–15)
BUN: 37 mg/dL — ABNORMAL HIGH (ref 6–20)
CO2: 19 mmol/L — AB (ref 22–32)
Calcium: 8.7 mg/dL — ABNORMAL LOW (ref 8.9–10.3)
Chloride: 118 mmol/L — ABNORMAL HIGH (ref 101–111)
Creatinine, Ser: 1.39 mg/dL — ABNORMAL HIGH (ref 0.44–1.00)
GFR calc Af Amer: 36 mL/min — ABNORMAL LOW (ref >60)
GFR calc non Af Amer: 31 mL/min — ABNORMAL LOW (ref >60)
GLUCOSE: 112 mg/dL — AB (ref 65–99)
POTASSIUM: 4.2 mmol/L (ref 3.5–5.1)
Sodium: 148 mmol/L — ABNORMAL HIGH (ref 135–145)

## 2017-08-22 LAB — GLUCOSE, CAPILLARY: Glucose-Capillary: 103 mg/dL — ABNORMAL HIGH (ref 65–99)

## 2017-08-22 LAB — URINE CULTURE

## 2017-08-22 MED ORDER — ORAL CARE MOUTH RINSE
15.0000 mL | Freq: Two times a day (BID) | OROMUCOSAL | Status: DC
  Administered 2017-08-22 – 2017-08-23 (×2): 15 mL via OROMUCOSAL

## 2017-08-22 MED ORDER — LEVOTHYROXINE SODIUM 25 MCG PO TABS
137.0000 ug | ORAL_TABLET | ORAL | Status: DC
  Administered 2017-08-23: 137 ug via ORAL
  Filled 2017-08-22: qty 1

## 2017-08-22 MED ORDER — INFLUENZA VAC SPLIT HIGH-DOSE 0.5 ML IM SUSY
0.5000 mL | PREFILLED_SYRINGE | Freq: Once | INTRAMUSCULAR | Status: AC
  Administered 2017-08-23: 0.5 mL via INTRAMUSCULAR
  Filled 2017-08-22: qty 0.5

## 2017-08-22 MED ORDER — ADULT MULTIVITAMIN LIQUID CH
15.0000 mL | Freq: Every day | ORAL | Status: DC
  Administered 2017-08-22 – 2017-08-23 (×2): 15 mL via ORAL
  Filled 2017-08-22 (×4): qty 15

## 2017-08-22 MED ORDER — DEXTROSE-NACL 5-0.45 % IV SOLN
INTRAVENOUS | Status: DC
  Administered 2017-08-22 – 2017-08-23 (×2): via INTRAVENOUS

## 2017-08-22 MED ORDER — INFLUENZA VAC SPLIT HIGH-DOSE 0.5 ML IM SUSY: 0.5000 mL | PREFILLED_SYRINGE | Freq: Once | INTRAMUSCULAR | Status: DC

## 2017-08-22 MED ORDER — FAMOTIDINE 20 MG PO TABS
20.0000 mg | ORAL_TABLET | Freq: Every day | ORAL | Status: DC
  Administered 2017-08-22 – 2017-08-23 (×2): 20 mg via ORAL
  Filled 2017-08-22 (×2): qty 1

## 2017-08-22 NOTE — Progress Notes (Signed)
Initial Nutrition Assessment  DOCUMENTATION CODES:  Non-severe (moderate) malnutrition in context of chronic illness  INTERVENTION:  Once diet advanced: Magic cup TID with meals, each supplement provides 290 kcal and 9 grams of protein  Other favorite food tems as diet order allows  NUTRITION DIAGNOSIS:  Moderate Malnutrition(Chronic context) related to poor appetite, chronic illness(Dementia) as evidenced by moderate muscle/fat depletion  GOAL:  Patient will meet greater than or equal to 90% of their needs  MONITOR:  PO intake, Supplement acceptance, Diet advancement, Weight trends, Labs, Skin  REASON FOR ASSESSMENT:  Low Braden    ASSESSMENT:  84 y/o female PMHx Dementia, HTN, Prior hip fx. Brought to hospital by family due to worsened AMS, poor intake and was found in ED to have UTI w/ AKI. Admitted for management.     Patient unable to provide any history due to dementia. Son at bedside, who is caregiver, gives all history.   Per son, the patient has experienced a significant decline over the past few weeks. The patient has been eating minimal amounts. Family cites examples of a half a breakfast croissant and half of a sandwich. Fluid intake has largely been comprised of Diet Dr Reino Kent and milkshakes. She does not like Ensure/Boost and will only infrequently drink it. Son denies any troubles chewing/swallowing and patient does not follow a modified texture diet. Patient is apparently able to eat, and greatly enjoys, pizza. Patient does not take any vitamins at home. No n/v/c/d.   Son reports the patient was weighed at an outpatient appointment 2 months ago as 140 lbs. She is now ~128 lbs.  Long term, she has had significant weight decline in past 5 years. the patient was 152 lbs approximately 2 years ago. Was in 170's 4 years ago.  At this time, patient is NPO pending ST eval. Given patient does not like Ensure, RD asked about other favorite foods. Family states the patient will  scarf down icecream and pizza. Will order magic cups and pizza if diet allows  Meds: VF, IV abx, IV ppi Labs:Albumin:3.2, BUN/Creat:1.39/8.7  Recent Labs  Lab 08/20/17 2055 08/21/17 0617 08/22/17 0621  NA 143 146* 148*  K 4.7 4.7 4.2  CL 112* 115* 118*  CO2 18* 18* 19*  BUN 46* 45* 37*  CREATININE 1.82* 1.65* 1.39*  CALCIUM 9.1 8.8* 8.7*  GLUCOSE 108* 89 112*    NUTRITION - FOCUSED PHYSICAL EXAM:   Most Recent Value  Orbital Region  Moderate depletion  Upper Arm Region  Mild depletion  Thoracic and Lumbar Region  Moderate depletion  Buccal Region  No depletion  Temple Region  Moderate depletion  Clavicle Bone Region  Moderate depletion  Clavicle and Acromion Bone Region  Moderate depletion  Scapular Bone Region  Unable to assess  Dorsal Hand  No depletion  Patellar Region  Moderate depletion  Anterior Thigh Region  Moderate depletion  Posterior Calf Region  Moderate depletion       Diet Order:  Diet NPO time specified  EDUCATION NEEDS:  No education needs have been identified at this time  Skin: MSAD to groin, Skin tears to BL arms/legs  Last BM:  Unknown  Height:  Ht Readings from Last 1 Encounters:  08/20/17 5' (1.524 m)   Weight:  Wt Readings from Last 1 Encounters:  08/21/17 127 lb 13.9 oz (58 kg)   Wt Readings from Last 10 Encounters:  08/21/17 127 lb 13.9 oz (58 kg)  09/08/15 153 lb (69.4 kg)  08/13/15 160  lb (72.6 kg)  07/21/15 150 lb (68 kg)  10/07/14 150 lb (68 kg)  07/31/14 150 lb (68 kg)  07/04/14 150 lb (68 kg)  06/26/14 160 lb (72.6 kg)  06/23/14 160 lb (72.6 kg)  05/20/14 165 lb (74.8 kg)   Ideal Body Weight:  45.45 kg  BMI:  Body mass index is 24.97 kg/m.  Estimated Nutritional Needs:  Kcal:  1450-1600 kcals (25-28 kcal/kg bw) Protein:  65-75g Pro (1.1-1.3 g/kg bw) Fluid:  >1.5 L fluid (25 ml/kcal)  Christophe LouisNathan Jordana Dugue RD, LDN, CNSC Clinical Nutrition Pager: [phone] 08/22/2017 12:40 PM

## 2017-08-22 NOTE — Evaluation (Signed)
Clinical/Bedside Swallow Evaluation Patient Details  Name: [name] MRN: [id number] Date of Birth: 22-Sep-1920  Today's Date: 08/22/2017 Time: SLP Start Time (ACUTE ONLY): 1230 SLP Stop Time (ACUTE ONLY): 1255 SLP Time Calculation (min) (ACUTE ONLY): 25 min  Past Medical History:  Past Medical History:  Diagnosis Date  . Arthritis    right knee  . Fx intertrochanteric hip (HCC) 07/23/15  . Hiatal hernia   . Hypertension   . Hypothyroidism   . Peripheral neuropathy   . Schatzki's ring   . Shortness of breath   . Vertebral compression fracture [facility])    Past Surgical History:  Past Surgical History:  Procedure Laterality Date  . ABDOMINAL HYSTERECTOMY  1977  . APPENDECTOMY  1941  . CHOLECYSTECTOMY  1967  . COLONOSCOPY  10/24/08   anal papilla otherwise normal, pan colonic diverticulum and colonic mucosa  . COLONOSCOPY  remote   Dr. Jerolyn Shin Smith-->polpys per patient  . CYSTOSCOPY WITH STENT PLACEMENT Bilateral 05/20/2014   Procedure: CYSTOSCOPY WITH BILATERAL DOUBLE J STENT PLACEMENT;  Surgeon: Chelsea Aus, MD;  Location: [facility];  Service: Urology;  Laterality: Bilateral;  . ERCP  1995   Dr. Rourk--> for dilated biliary tree. no stone or neoplasm or stricture found  . ESOPHAGOGASTRODUODENOSCOPY  10/24/08   normal, status post [facility] dilator, small hiatal hernia deformity of the antrum/pylorus  . ESOPHAGOGASTRODUODENOSCOPY  1995   Dr. Rourk--> empiric dilation of esophagus with resolution of dysphagia  . ESOPHAGOGASTRODUODENOSCOPY   09/21/2011   RMR: Noncritical Schatzki's ring s/p dilated/Abnormal esophageal mucosa/Small  hiatal hermia  . HEMORRHOID SURGERY  1999  . HIP ARTHROPLASTY Right 07/04/2014   Procedure: RIGHT PARTIAL HIP REPLACEMENT;  Surgeon: Vickki Hearing, MD;  Location: [facility];  Service: Orthopedics;  Laterality: Right;  . HIP FRACTURE SURGERY  left hip repair  . HOLMIUM LASER APPLICATION Bilateral 06/26/2014   Procedure: HOLMIUM LASER APPLICATION;   Surgeon: Chelsea Aus, MD;  Location: [facility];  Service: Urology;  Laterality: Bilateral;  . INTRAMEDULLARY (IM) NAIL INTERTROCHANTERIC Left 07/20/2015   Procedure: INTRAMEDULLARY (IM) NAIL INTERTROCHANTRIC LEFT HIP;  Surgeon: Kathryne Hitch, MD;  Location: [facility];  Service: Orthopedics;  Laterality: Left;  . KNEE SURGERY  09/2008  . ORIF FEMUR FRACTURE Left 08/14/2015   Procedure: PLACEMENT OF LEFT DISTAL INTERLOCKING SCREW FEMUR;  Surgeon: Kathryne Hitch, MD;  Location: [facility];  Service: Orthopedics;  Laterality: Left;  . SHOULDER SURGERY    . SPINE SURGERY  1986   HPI:  [name]  is a 84 y.o. female, with h/o dementia, hypertension, prior hip fracture, hypothyroidism, vertebral compression fracture was brought to the hospital for lethargy, poor po intake, altered mental status. Patient is usually significantly debilitated, walks with a walker. Over the past few days she has had poor po intake.  As per son there has been no history of vomiting or diarrhea.  Patient did not complain of chest pain or shortness of breath. BSE ordered. Pt not alert enough yesterday to participate.    Assessment / Plan / Recommendation Clinical Impression  Pt alert, pleasantly confused, and cooperative for clinical swallow evlauation today. Her son, Reita Cliche, was at bedside and provided background information. He reports that Pt has been "sleepier" than usual and has been "chewing" her medications at times. She typical consumes a regular diet with thin liquids at home. Oral motor examination reveals poor dentition with broken teeth. Pt consumed ice chips, water via spoon/cup/straw, graham crackers, and mechanical soft textures  with SLP assist. Pt with primarily cognitive based dysphagia characterized by poor attention to task and talking with po during intake resulting in some labial spillage, however no overt signs or symptoms of aspiration at bedside. Pt will need supervision and prn assist with intake due  to cognitive impairment. Recommend D3/mech soft and thin liquids with po meds crushed as able in puree. SLP to follow during acute stay.   SLP Visit Diagnosis: Dysphagia, oral phase (R13.11)    Aspiration Risk  Mild aspiration risk    Diet Recommendation Dysphagia 3 (Mech soft);Thin liquid   Liquid Administration via: Cup;Straw Medication Administration: Crushed with puree Supervision: Patient able to self feed;Staff to assist with self feeding;Full supervision/cueing for compensatory strategies Compensations: Slow rate;Small sips/bites Postural Changes: Seated upright at 90 degrees;Remain upright for at least 30 minutes after po intake    Other  Recommendations Oral Care Recommendations: Oral care BID;Staff/trained caregiver to provide oral care Other Recommendations: Clarify dietary restrictions   Follow up Recommendations 24 hour supervision/assistance      Frequency and Duration min 2x/week  1 week       Prognosis Prognosis for Safe Diet Advancement: Good Barriers to Reach Goals: Cognitive deficits      Swallow Study   General Date of Onset: 08/20/17 HPI: [name]  is a 84 y.o. female, with h/o dementia, hypertension, prior hip fracture, hypothyroidism, vertebral compression fracture was brought to the hospital for lethargy, poor po intake, altered mental status. Patient is usually significantly debilitated, walks with a walker. Over the past few days she has had poor po intake.  As per son there has been no history of vomiting or diarrhea.  Patient did not complain of chest pain or shortness of breath. BSE ordered. Pt not alert enough yesterday to participate.  Type of Study: Bedside Swallow Evaluation Previous Swallow Assessment: 07/2015 BSE D3/thin Diet Prior to this Study: NPO Temperature Spikes Noted: No Respiratory Status: Room air History of Recent Intubation: No Behavior/Cognition: Alert;Cooperative;Pleasant mood;Confused;Requires cueing Oral Cavity Assessment:  Within Functional Limits Oral Care Completed by SLP: Recent completion by staff Oral Cavity - Dentition: Poor condition(some broken dentition) Vision: Functional for self-feeding Self-Feeding Abilities: Able to feed self;Needs assist Patient Positioning: Upright in bed Baseline Vocal Quality: Normal Volitional Cough: Cognitively unable to elicit Volitional Swallow: Unable to elicit    Oral/Motor/Sensory Function Overall Oral Motor/Sensory Function: Within functional limits   Ice Chips Ice chips: Within functional limits Presentation: Spoon   Thin Liquid Thin Liquid: Within functional limits Presentation: Cup;Self Fed;Straw;Spoon    Nectar Thick Nectar Thick Liquid: Not tested   Honey Thick Honey Thick Liquid: Not tested   Puree Puree: Within functional limits Presentation: Spoon   Solid      Solid: Impaired Presentation: Spoon(difficulty with self feeding) Oral Phase Impairments: Poor awareness of bolus Oral Phase Functional Implications: Prolonged oral transit       Thank you,  Havery MorosDabney Maitland Lesiak, CCC-SLP [phone]  Izzabella Besse 08/22/2017,1:34 PM

## 2017-08-22 NOTE — Plan of Care (Signed)
  Acute Rehab PT Goals(only PT should resolve) Pt Will Go Supine/Side To Sit 08/22/2017 1209 - Progressing by Ocie BobWatkins, Khaliah Barnick, PT Flowsheets Taken 08/22/2017 1209  Pt will go Supine/Side to Sit with moderate assist Patient Will Transfer Sit To/From Stand 08/22/2017 1209 - Progressing by Ocie BobWatkins, Yazmina Pareja, PT Flowsheets Taken 08/22/2017 1209  Patient will transfer sit to/from stand with moderate assist Pt Will Transfer Bed To Chair/Chair To Bed 08/22/2017 1209 - Progressing by Ocie BobWatkins, Finneus Kaneshiro, PT Flowsheets Taken 08/22/2017 1209  Pt will Transfer Bed to Chair/Chair to Bed with mod assist Pt Will Ambulate 08/22/2017 1209 - Progressing by Ocie BobWatkins, Antwonette Feliz, PT Flowsheets Taken 08/22/2017 1209  Pt will Ambulate 15 feet;with moderate assist;with rolling walker  12:10 PM, 08/22/17 Ocie BobJames Michaelia Beilfuss, MPT Physical Therapist with [facility] [phone] office [phone] mobile phone

## 2017-08-22 NOTE — Evaluation (Signed)
Physical Therapy Evaluation Patient Details Name: [name] MaeMary E [name] MRN: [id number] DOB: September 21, 1920 Today's Date: 08/22/2017   History of Present Illness  [name]  is a 84 y.o. female, with h/o dementia, hypertension, prior hip fracture, hypothyroidism, vertebral compression fracture was brought to the hospital for lethargy, poor po intake, altered mental status. Patient is usually significantly debilitated, walks with a walker. Over the past few days she has had poor po intake.  As per son there has been no history of vomiting or diarrhea.  Patient did not complain of chest pain or shortness of breath.    Clinical Impression  Patient requires Max verbal/tactile cueing to participate with therapy due to confusion/slightly agitated and more cooperative with family members present/assisting.  Patient limited to standing for up to 10 seconds due to BLE weakness and fatigue.  Patient required Max/total assist to reposition when put back to bed.  Patient will benefit from continued physical therapy in hospital and recommended venue below to increase strength, balance, endurance for safe ADLs and gait.    Follow Up Recommendations SNF    Equipment Recommendations  None recommended by PT    Recommendations for Other Services       Precautions / Restrictions Precautions Precautions: Fall Restrictions Weight Bearing Restrictions: No      Mobility  Bed Mobility Overal bed mobility: Needs Assistance Bed Mobility: Supine to Sit;Sit to Supine     Supine to sit: Max assist Sit to supine: Max assist      Transfers Overall transfer level: Needs assistance Equipment used: Rolling walker (2 wheeled) Transfers: Sit to/from Stand Sit to Stand: Max assist            Ambulation/Gait                Stairs            Wheelchair Mobility    Modified Rankin (Stroke Patients Only)       Balance Overall balance assessment: Needs assistance Sitting-balance support: Feet  unsupported;Bilateral upper extremity supported Sitting balance-Leahy Scale: Fair     Standing balance support: Bilateral upper extremity supported;During functional activity Standing balance-Leahy Scale: Poor                               Pertinent Vitals/Pain Pain Assessment: Faces Pain Score: 0-No pain    Home Living Family/patient expects to be discharged to:: Private residence Living Arrangements: Children(son) Available Help at Discharge: Family Type of Home: House Home Access: Ramped entrance     Home Layout: One level Home Equipment: Environmental consultantWalker - 2 wheels;Cane - single point;Shower seat;Bedside commode;Wheelchair - manual      Prior Function Level of Independence: Needs assistance   Gait / Transfers Assistance Needed: assisted household ambulation with RW  ADL's / Homemaking Assistance Needed: assisted by her son        Hand Dominance        Extremity/Trunk Assessment   Upper Extremity Assessment Upper Extremity Assessment: Generalized weakness    Lower Extremity Assessment Lower Extremity Assessment: Generalized weakness    Cervical / Trunk Assessment Cervical / Trunk Assessment: Kyphotic  Communication   Communication: HOH(appears confused, information provided by her sons)  Cognition Arousal/Alertness: Awake/alert Behavior During Therapy: Restless;Agitated Overall Cognitive Status: Impaired/Different from baseline Area of Impairment: Following commands                       Following Commands: Follows  one step commands inconsistently;Follows one step commands with increased time       General Comments: requires repeated verbal/tactile cueing to participate, more cooperative with family assisting      General Comments      Exercises     Assessment/Plan    PT Assessment Patient needs continued PT services  PT Problem List Decreased strength;Decreased activity tolerance;Decreased balance;Decreased mobility       PT  Treatment Interventions Gait training;Functional mobility training;Therapeutic activities;Therapeutic exercise;Patient/family education    PT Goals (Current goals can be found in the Care Plan section)  Acute Rehab PT Goals Patient Stated Goal: return home Time For Goal Achievement: 09/05/17 Potential to Achieve Goals: Good    Frequency Min 3X/week   Barriers to discharge        Co-evaluation               AM-PAC PT "6 Clicks" Daily Activity  Outcome Measure Difficulty turning over in bed (including adjusting bedclothes, sheets and blankets)?: A Lot Difficulty moving from lying on back to sitting on the side of the bed? : A Lot Difficulty sitting down on and standing up from a chair with arms (e.g., wheelchair, bedside commode, etc,.)?: A Lot Help needed moving to and from a bed to chair (including a wheelchair)?: Total Help needed walking in hospital room?: Total Help needed climbing 3-5 steps with a railing? : Total 6 Click Score: 9    End of Session Equipment Utilized During Treatment: Gait belt Activity Tolerance: Patient limited by fatigue;Treatment limited secondary to agitation Patient left: in bed;with call bell/phone within reach;with bed alarm set;with family/visitor present Nurse Communication: Mobility status PT Visit Diagnosis: Unsteadiness on feet (R26.81);Other abnormalities of gait and mobility (R26.89);Muscle weakness (generalized) (M62.81)    Time: 1610-9604 PT Time Calculation (min) (ACUTE ONLY): 33 min   Charges:   PT Evaluation $PT Eval Moderate Complexity: 1 Mod PT Treatments $Therapeutic Activity: 23-37 mins   PT G Codes:        12:08 PM, Sep 18, 2017 Ocie Bob, MPT Physical Therapist with [facility] [phone] office [phone] mobile phone

## 2017-08-22 NOTE — Clinical Social Work Note (Addendum)
Clinical Social Work Assessment  Patient Details  Name: [name] MRN: [id number] Date of Birth: 01-Dec-1920  Date of referral:  08/22/17               Reason for consult:  Facility Placement                Permission sought to share information with:  Facility Industrial/product designerContact Representative Permission granted to share information::  Yes, Verbal Permission Granted  Name::        Agency::     Relationship::  [facility]  Contact Information:     Housing/Transportation Living arrangements for the past 2 months:  Single Family Home Source of Information:  Adult Children Patient Interpreter Needed:  None Criminal Activity/Legal Involvement Pertinent to Current Situation/Hospitalization:  No - Comment as needed Significant Relationships:  Adult Children Lives with:  Adult Children Do you feel safe going back to the place where you live?  No Need for family participation in patient care:  Yes (Comment)  Care giving concerns:  Pt being recommended for SNF rehab. ED notes indicate pt arrived with stool crusted on her feet and with minor bruising and abrasions reportedly from a fall out of bed last week. Notes indicate it does not appear current living situation is appropriate. Will work on SNF referrals and will likely also refer to APS.   Social Worker assessment / plan: Pt is a 84 year old female referred to CSW for SNF rehab. Reviewed pt's record today and discussed SNF referral options with pt's son, [name], by phone. Bobby [facility] He does not wish to provide an alternative option at this time. Will start referral and follow for dc planning.  Employment status:  Retired Health and safety inspectornsurance information:  Medicare PT Recommendations:  Skilled Nursing Facility Information / Referral to community resources:     Patient/Family's Response to care: Pt has dementia. Family accepting of care.  Patient/Family's Understanding of and Emotional Response to Diagnosis, Current Treatment, and Prognosis:  Family appear to have basic understanding of pt's diagnosis and treatment recommendations. Family wish to pursue SNF rehab at this time. No emotional distress identified.  Emotional Assessment Appearance:  Appears stated age Attitude/Demeanor/Rapport:  Unable to Assess Affect (typically observed):  Unable to Assess Orientation:    Alcohol / Substance use:  Not Applicable Psych involvement (Current and /or in the community):  No (Comment)  Discharge Needs  Concerns to be addressed:  Discharge Planning Concerns Readmission within the last 30 days:  No Current discharge risk:  Physical Impairment, Dependent with Mobility Barriers to Discharge:  No Barriers Identified   Elliot GaultKathleen Irwin Toran, LCSW 08/22/2017, 12:22 PM

## 2017-08-22 NOTE — Progress Notes (Signed)
[facility] PROGRESS NOTE  [name] WUJ:[id number] DOB: Jan 03, 1921 DOA: 08/20/2017 PCP: Butch Penny, MD (Inactive)  Interim summary and HPI 84 y.o. female, with h/o dementia, hypertension, prior hip fracture, hypothyroidism, vertebral compression fracture was brought to the hospital for lethargy, poor po intake, altered mental status. Patient is usually debilitated, walks with a walker. Over the past few days she has had poor po intake unable to get out of bed.  Found to have dehydration and acute UTI.  Assessment/Plan: 1-acute toxic encephalopathy superimposed on chronic dementia: -Continue supportive care, IV fluids and current antibiotics therapy (see below) -Unknown baseline -more alert, talking understandable words, but able to follow simple commands and saying yes/no. -clinically improving -will need SNF at discharge  2-hypothyroidism -continue synthroid, back to oral route as diet advance today -TSH WNL  3-AKI -in the setting of dehydration and pre-renal azotemia -continue IVF's and encourage PO intake -continue to minimize use of nephrotoxic agents  -renal function improving   4-UTI -Continue IV Rocephin; plan is to treat with 3 days IV and subsequently switched to oral regimen (on 3/13) using Ceftin for another 4 days and complete therapy. -Cultures unfortunately demonstrated multiple microorganisms and unable to assist with antibiotics decision.  5-physical deconditioning and dysphagia -continue PT while inpatient; recommendations are for SNF -SW aware and searching  -following SPL recommendations will start dysphagia 3 with thin liquids  6-moderate protein calorie malnutrition  -start feeding supplements as per nutritional service rec's   Code Status: DNR Family Communication: Son at bedside  Disposition Plan: Continue IV fluids, continue IV antibiotics; physical therapy recommending sniff; speech therapy recommended dysphagia 3 diet.      Consultants:  None  Procedures:  See below for x-ray reports  Antibiotics:  Rocephin 3/10  HPI/Subjective: More alert, oriented x1 and able to follow simple commands, patient talking none understandable words.  Objective: Vitals:   08/22/17 0638 08/22/17 1155  BP: 120/79   Pulse: 90   Resp: 15   Temp: 97.9 F (36.6 C)   SpO2: 100% 99%    Intake/Output Summary (Last 24 hours) at 08/22/2017 1320 Last data filed at 08/22/2017 1100 Gross per 24 hour  Intake 1482.08 ml  Output -  Net 1482.08 ml   Filed Weights   08/20/17 2027 08/21/17 0154  Weight: 61.2 kg (135 lb) 58 kg (127 lb 13.9 oz)    Exam:   General: Afebrile, more awake and currently talking (even unable to truly understand her words); expressing no pain or discomfort.  Patient significantly weak and deconditioned.    Cardiovascular: S1 and S2, no rubs, no gallops, no JVD.    Respiratory: No using accessory muscles, no wheezing, no crackles, scattered rhonchi.    Abdomen: Soft, nontender, nondistended, positive bowel sounds.   Musculoskeletal: No edema, cyanosis or clubbing on exam.  Data Reviewed: Basic Metabolic Panel: Recent Labs  Lab 08/20/17 2055 08/21/17 0617 08/22/17 0621  NA 143 146* 148*  K 4.7 4.7 4.2  CL 112* 115* 118*  CO2 18* 18* 19*  GLUCOSE 108* 89 112*  BUN 46* 45* 37*  CREATININE 1.82* 1.65* 1.39*  CALCIUM 9.1 8.8* 8.7*   Liver Function Tests: Recent Labs  Lab 08/20/17 2055 08/21/17 0617  AST 22 22  ALT 14 12*  ALKPHOS 71 62  BILITOT 0.8 0.9  PROT 6.5 6.2*  ALBUMIN 3.4* 3.2*    Recent Labs  Lab 08/20/17 2056  AMMONIA 11   CBC: Recent Labs  Lab 08/20/17 2055 08/21/17 0617 08/22/17  0621  WBC 10.5 9.1 7.6  NEUTROABS 8.4*  --   --   HGB 12.4 12.1 11.8*  HCT 39.8 38.8 38.9  MCV 92.3 92.8 93.1  PLT 366 255 332    Studies: Ct Head Wo Contrast  Result Date: 08/20/2017 CLINICAL DATA:  Pt was found by her son at 4pm today after getting up from sleep,  son reported to ems that pt normally goes to bed at 7pm and then wakes up the previous day in the afternoon, last time son saw pt was 7pm yesterday, son reported that pt did not respond to him, speech slurred, ems reports that pt speech is "garbled" with them as well, pt not following commands upon arrival to er. EXAM: CT HEAD WITHOUT CONTRAST TECHNIQUE: Contiguous axial images were obtained from the base of the skull through the vertex without intravenous contrast. COMPARISON:  07/19/2015 FINDINGS: Brain: No evidence of acute infarction, hemorrhage, hydrocephalus, extra-axial collection or mass lesion/mass effect. There is ventricular and sulcal enlargement reflecting moderate to advanced atrophy. Periventricular white matter hypoattenuation is also noted consistent with mild chronic microvascular ischemic change. Vascular: No hyperdense vessel or unexpected calcification. Skull: Normal. Negative for fracture or focal lesion. Sinuses/Orbits: Globes and orbits are unremarkable. Mucosal thickening mostly opacifies posterior ethmoid air cells on the right. Remaining sinuses are clear. Clear mastoid air cells. Other: None. IMPRESSION: 1. No acute intracranial abnormalities. 2. Moderate to advanced atrophy and mild chronic microvascular ischemic change. Electronically Signed   By: Amie Portlandavid  Ormond M.D.   On: 08/20/2017 21:36   Dg Chest Port 1 View  Result Date: 08/20/2017 CLINICAL DATA:  Pt was found by her son at 4pm today after getting up from sleep, son reported to ems that pt normally goes to bed at 7pm and then wakes up the previous day in the afternoon, last time son saw pt was 7pm yesterday, son reported that pt did not respond to him, speech slurred, ems reports that pt speech is "garbled" with them as well, pt not following commands upon arrival to er. EXAM: PORTABLE CHEST 1 VIEW COMPARISON:  08/14/2015 FINDINGS: Cardiac silhouette is normal in size. No mediastinal or hilar masses. Lungs are clear. No  convincing pleural effusion. No pneumothorax. Skeletal structures are demineralized but grossly intact. IMPRESSION: No acute cardiopulmonary disease. Electronically Signed   By: Amie Portlandavid  Ormond M.D.   On: 08/20/2017 21:37    Scheduled Meds: . enoxaparin (LOVENOX) injection  30 mg Subcutaneous Q24H  . [START ON 08/23/2017] Influenza vac split quadrivalent PF  0.5 mL Intramuscular Once  . levothyroxine  68 mcg Intravenous Daily  . nystatin   Topical TID   Continuous Infusions: . cefTRIAXone (ROCEPHIN)  IV Stopped (08/21/17 2355)  . dextrose 5 % and 0.45% NaCl 75 mL/hr at 08/22/17 1011  . famotidine (PEPCID) IV Stopped (08/21/17 1810)    Time spent: 25 minutes   Vassie Lollarlos Jeyden Coffelt  MD [facility] Pager [phone]. If 7PM-7AM, please contact night-coverage at [email or web address], password [email or web address] 08/22/2017, 1:20 PM  LOS: 2 days

## 2017-08-22 NOTE — Progress Notes (Signed)
PT Cancellation Note  Patient Details Name: [name] MRN: [id number] DOB: 08-Mar-1921   Cancelled Treatment:    Reason Eval/Treat Not Completed: Patient declined, no reason specified.  Patient resisted movement, not cooperative for getting up.  Will wait until family members are present to see if patient more cooperative.    8:39 AM, 08/22/17 Ocie BobJames Fleetwood Pierron, MPT Physical Therapist with [facility] [phone] office [phone] mobile phone

## 2017-08-22 NOTE — NC FL2 (Signed)
[place] MEDICAID FL2 LEVEL OF CARE SCREENING TOOL     IDENTIFICATION  Patient Name: [name] Birthdate: Jun 29, 1920 Sex: female Admission Date (Current Location): 08/20/2017  [facility] [place]   Facility and Address:  [facility],  [address], [place] [id number]      Provider Number: [id number]  Attending Physician Name and Address:  Vassie LollMadera, Carlos, MD  Relative Name and Phone Number:  Tedd SiasBobby Dziedzic (son) [phone]    Current Level of Care: Hospital Recommended Level of Care: Skilled Nursing Facility Prior Approval Number: [id number]  Date Approved/Denied: 05/23/14 PASRR Number:    Discharge Plan: SNF    Current Diagnoses: Patient Active Problem List   Diagnosis Date Noted  . UTI (urinary tract infection) 08/20/2017  . Intertrochanteric fracture of left femur (HCC) 08/13/2015  . Left displaced femoral neck fracture (HCC) 08/13/2015  . Hyperkalemia 07/20/2015  . CKD (chronic kidney disease) stage 3, GFR 30-59 ml/min (HCC) 07/20/2015  . Closed nondisplaced intertrochanteric fracture of left femur (HCC)   . Right ureteral calculus 06/26/2014  . Acute renal failure (HCC) 05/20/2014  . Ureteral stone with hydronephrosis 05/20/2014  . Hyperglycemia 05/20/2014  . Renal atrophy 05/20/2014  . Leukocytosis 05/20/2014  . Hypertension   . UTI (lower urinary tract infection) 11/09/2012  . Delirium superimposed on dementia 11/09/2012  . Esophageal dysphagia 09/06/2011  . Hoarseness 09/06/2011  . Hypothyroidism 10/10/2008    Orientation RESPIRATION BLADDER Height & Weight     Self  Normal Incontinent Weight: 127 lb 13.9 oz (58 kg) Height:  5' (152.4 cm)  BEHAVIORAL SYMPTOMS/MOOD NEUROLOGICAL BOWEL NUTRITION STATUS      Incontinent Diet(see dc summary)  AMBULATORY STATUS COMMUNICATION OF NEEDS Skin   Extensive Assist Verbally Normal                       Personal Care Assistance Level of Assistance  Bathing, Feeding,  Dressing Bathing Assistance: Limited assistance Feeding assistance: Limited assistance Dressing Assistance: Limited assistance     Functional Limitations Info  Sight, Hearing, Speech Sight Info: Adequate Hearing Info: Adequate Speech Info: Adequate    SPECIAL CARE FACTORS FREQUENCY  PT (By licensed PT)     PT Frequency: 5 times week              Contractures Contractures Info: Not present    Additional Factors Info  Code Status, Allergies Code Status Info: DNR Allergies Info: Lactulose, Penicillins, Prilosec           Current Medications (08/22/2017):  This is the current hospital active medication list Current Facility-Administered Medications  Medication Dose Route Frequency Provider Last Rate Last Dose  . acetaminophen (TYLENOL) tablet 650 mg  650 mg Oral Q6H PRN Meredeth IdeLama, Gagan S, MD       Or  . acetaminophen (TYLENOL) suppository 650 mg  650 mg Rectal Q6H PRN Meredeth IdeLama, Gagan S, MD      . cefTRIAXone (ROCEPHIN) 1 g in sodium chloride 0.9 % 100 mL IVPB  1 g Intravenous Q24H Vassie LollMadera, Carlos, MD   Stopped at 08/21/17 2355  . dextrose 5 %-0.45 % sodium chloride infusion   Intravenous Continuous Vassie LollMadera, Carlos, MD 75 mL/hr at 08/22/17 1011    . enoxaparin (LOVENOX) injection 30 mg  30 mg Subcutaneous Q24H Vassie LollMadera, Carlos, MD   30 mg at 08/22/17 1011  . famotidine (PEPCID) IVPB 20 mg premix  20 mg Intravenous Q24H Vassie LollMadera, Carlos, MD   Stopped at 08/21/17 1810  . [  START ON 08/23/2017] Influenza vac split quadrivalent PF (FLUZONE HIGH-DOSE) injection 0.5 mL  0.5 mL Intramuscular Once Vassie Loll, MD      . levothyroxine (SYNTHROID, LEVOTHROID) injection 68 mcg  68 mcg Intravenous Daily Vassie Loll, MD   68 mcg at 08/22/17 1011  . nystatin (MYCOSTATIN/NYSTOP) topical powder   Topical TID Schorr, Roma Kayser, NP      . ondansetron (ZOFRAN) tablet 4 mg  4 mg Oral Q6H PRN Meredeth Ide, MD       Or  . ondansetron (ZOFRAN) injection 4 mg  4 mg Intravenous Q6H PRN Meredeth Ide, MD          Discharge Medications: Please see discharge summary for a list of discharge medications.  Relevant Imaging Results:  Relevant Lab Results:   Additional Information SSN: [id number] LCSW

## 2017-08-23 LAB — BASIC METABOLIC PANEL
ANION GAP: 8 (ref 5–15)
BUN: 29 mg/dL — ABNORMAL HIGH (ref 6–20)
CALCIUM: 8.4 mg/dL — AB (ref 8.9–10.3)
CHLORIDE: 113 mmol/L — AB (ref 101–111)
CO2: 20 mmol/L — ABNORMAL LOW (ref 22–32)
Creatinine, Ser: 1.25 mg/dL — ABNORMAL HIGH (ref 0.44–1.00)
GFR calc non Af Amer: 36 mL/min — ABNORMAL LOW (ref >60)
GFR, EST AFRICAN AMERICAN: 41 mL/min — AB (ref >60)
Glucose, Bld: 142 mg/dL — ABNORMAL HIGH (ref 65–99)
Potassium: 4 mmol/L (ref 3.5–5.1)
SODIUM: 141 mmol/L (ref 135–145)

## 2017-08-23 MED ORDER — CEFUROXIME AXETIL 250 MG PO TABS: 250.0000 mg | ORAL_TABLET | Freq: Two times a day (BID) | ORAL | 0 refills | Status: AC

## 2017-08-23 NOTE — Clinical Social Work Placement (Signed)
   CLINICAL SOCIAL WORK PLACEMENT  NOTE  Date:  08/23/2017  Patient Details  Name: [name] MRN: [id number] Date of Birth: August 02, 1920  Clinical Social Work is seeking post-discharge placement for this patient at the Skilled  Nursing Facility level of care (*CSW will initial, date and re-position this form in  chart as items are completed):  Yes   Patient/family provided with [place] Clinical Social Work Department's list of facilities offering this level of care within the geographic area requested by the patient (or if unable, by the patient's family).  Yes   Patient/family informed of their freedom to choose among providers that offer the needed level of care, that participate in Medicare, Medicaid or managed care program needed by the patient, have an available bed and are willing to accept the patient.  Yes   Patient/family informed of [facility]'s ownership interest in [facility] and [facility], as well as of the fact that they are under no obligation to receive care at these facilities.  PASRR submitted to EDS on       PASRR number received on       Existing PASRR number confirmed on 08/22/17     FL2 transmitted to all facilities in geographic area requested by pt/family on 08/22/17     FL2 transmitted to all facilities within larger geographic area on       Patient informed that his/her managed care company has contracts with or will negotiate with certain facilities, including the following:        Yes   Patient/family informed of bed offers received.  Patient chooses bed at Other - please specify in the comment section below:([facility])     Physician recommends and patient chooses bed at      Patient to be transferred to Other - please specify in the comment section below:([facility]) on 08/23/17.  Patient to be transferred to facility by [facility]     Patient family notified on 08/23/17 of transfer.  Name of family member notified:  Reita ClicheBobby  (son)     PHYSICIAN       Additional Comment: Spoke with pt's son to inform of SNF offers. He chooses [facility]. He is aware plan is for dc today. [facility]. Envelope and transport form will be given to RN. There are no other SW needs for dc.   _______________________________________________ [name] GaultKathleen Anaysia Germer, LCSW 08/23/2017, 11:53 AM

## 2017-08-23 NOTE — Care Management Important Message (Signed)
Important Message  Patient Details  Name: [name] MRN: [id number] Date of Birth: 1920-08-05   Medicare Important Message Given:  Yes    Olanrewaju Osborn, Chrystine OilerSharley Diane, RN 08/23/2017, 1:32 PM

## 2017-08-23 NOTE — Discharge Summary (Signed)
Physician Discharge Summary  [name] ZOX:[id number] DOB: 1920/06/25 DOA: 08/20/2017  PCP: Butch PennyMcInnis, Angus, MD (Inactive)  Admit date: 08/20/2017  Discharge date: 08/23/2017  Admitted From:Home  [facility]  [facility]  Recommendations for Outpatient Follow-up:  1. Follow up with PCP in 2 weeks  Home Health:N/A  Equipment/Devices:N/A  Discharge Condition:Stable  CODE STATUS: DNR  Diet recommendation: Heart Healthy  Brief/Interim Summary:  84 y.o.female,with h/o dementia, hypertension, prior hip fracture, hypothyroidism, vertebral compression fracture was brought to the hospital for lethargy, poor po intake, altered mental status. Patient is usually debilitated, walks with a walker. Over the past few days she has had poor po intake unable to get out of bed.  Found to have dehydration and acute UTI with multiple organisms grown on urine culture.  She has completed a 3-day course of IV Rocephin and will require 4 more days of oral Ceftin to complete the course of treatment.  No acute events have been noted during the course of the stay.   Discharge Diagnoses:  Active Problems:   Hypothyroidism   Hypertension   Acute renal failure (HCC)   UTI (urinary tract infection)   Malnutrition of moderate degree  1. Acute encephalopathy superimposed on chronic dementia secondary to UTI.  Continue antibiotic treatment as prescribed with oral Ceftin for 4 more days to complete the course of treatment.  As noted above, urine culture with multiple organisms.  She is clinically improved but her baseline is unknown given her chronic dementia.  Skilled nursing facility is most appropriate for discharge at this time. 2. Hypothyroidism.  Continue Synthroid. 3. AK I.  This was secondary to dehydration and has improved with IV fluid.  Patient now tolerating oral intake. 4. UTI.  As above, continue Ceftin for 4 more days. 5. Dysphagia with physical deconditioning.  Dysphagia 3 with thin  liquid diet recommended and patient tolerating well.  SNF placement. 6. Moderate protein calorie malnutrition.  Feeding supplements per nutritional service recommendations.  Discharge Instructions  Discharge Instructions    Diet - low sodium heart healthy   Complete by:  As directed    Increase activity slowly   Complete by:  As directed      Allergies as of 08/23/2017      Reactions   Lactulose    REACTION: unknown reaction   Penicillins    REACTION: unknown reaction Patient tolerated ceftriaxone 08/20/2017   Prilosec [omeprazole] Itching, Rash      Medication List    TAKE these medications   cefUROXime 250 MG tablet Commonly known as:  CEFTIN Take 1 tablet (250 mg total) by mouth 2 (two) times daily for 4 days.   CRANBERRY FRUIT PO Take 1 capsule by mouth daily.   gabapentin 300 MG capsule Commonly known as:  NEURONTIN Take 1 capsule by mouth 3 (three) times daily.   levothyroxine 137 MCG tablet Commonly known as:  SYNTHROID, LEVOTHROID Take 137 mcg by mouth daily before breakfast.       Contact information for follow-up providers    Butch PennyMcInnis, Angus, MD Follow up in 2 week(s).   Specialty:  Family Medicine Contact information: [address] [place] [address] [phone]            Contact information for after-discharge care    Destination    [facility].   Service:  Skilled Nursing Contact information: [address] [place] [id number] [phone]  Allergies  Allergen Reactions  . Lactulose     REACTION: unknown reaction  . Penicillins     REACTION: unknown reaction Patient tolerated ceftriaxone 08/20/2017  . Prilosec [Omeprazole] Itching and Rash    Consultations:  None   Procedures/Studies: Ct Head Wo Contrast  Result Date: 08/20/2017 CLINICAL DATA:  Pt was found by her son at 4pm today after getting up from sleep, son reported to ems that pt normally  goes to bed at 7pm and then wakes up the previous day in the afternoon, last time son saw pt was 7pm yesterday, son reported that pt did not respond to him, speech slurred, ems reports that pt speech is "garbled" with them as well, pt not following commands upon arrival to er. EXAM: CT HEAD WITHOUT CONTRAST TECHNIQUE: Contiguous axial images were obtained from the base of the skull through the vertex without intravenous contrast. COMPARISON:  07/19/2015 FINDINGS: Brain: No evidence of acute infarction, hemorrhage, hydrocephalus, extra-axial collection or mass lesion/mass effect. There is ventricular and sulcal enlargement reflecting moderate to advanced atrophy. Periventricular white matter hypoattenuation is also noted consistent with mild chronic microvascular ischemic change. Vascular: No hyperdense vessel or unexpected calcification. Skull: Normal. Negative for fracture or focal lesion. Sinuses/Orbits: Globes and orbits are unremarkable. Mucosal thickening mostly opacifies posterior ethmoid air cells on the right. Remaining sinuses are clear. Clear mastoid air cells. Other: None. IMPRESSION: 1. No acute intracranial abnormalities. 2. Moderate to advanced atrophy and mild chronic microvascular ischemic change. Electronically Signed   By: Amie Portland M.D.   On: 08/20/2017 21:36   Dg Chest Port 1 View  Result Date: 08/20/2017 CLINICAL DATA:  Pt was found by her son at 4pm today after getting up from sleep, son reported to ems that pt normally goes to bed at 7pm and then wakes up the previous day in the afternoon, last time son saw pt was 7pm yesterday, son reported that pt did not respond to him, speech slurred, ems reports that pt speech is "garbled" with them as well, pt not following commands upon arrival to er. EXAM: PORTABLE CHEST 1 VIEW COMPARISON:  08/14/2015 FINDINGS: Cardiac silhouette is normal in size. No mediastinal or hilar masses. Lungs are clear. No convincing pleural effusion. No pneumothorax.  Skeletal structures are demineralized but grossly intact. IMPRESSION: No acute cardiopulmonary disease. Electronically Signed   By: Amie Portland M.D.   On: 08/20/2017 21:37     Discharge Exam: Vitals:   08/22/17 2138 08/23/17 0430  BP: 132/68 110/67  Pulse: 64 70  Resp: 18 19  Temp: 97.7 F (36.5 C) 98 F (36.7 C)  SpO2: 100% 100%   Vitals:   08/22/17 1300 08/22/17 1934 08/22/17 2138 08/23/17 0430  BP: 139/68  132/68 110/67  Pulse: 85  64 70  Resp: 17  18 19   Temp: 97.9 F (36.6 C)  97.7 F (36.5 C) 98 F (36.7 C)  TempSrc: Axillary  Axillary Axillary  SpO2: 98% 94% 100% 100%  Weight:      Height:        General: Pt is alert, awake, not in acute distress Cardiovascular: RRR, S1/S2 +, no rubs, no gallops Respiratory: CTA bilaterally, no wheezing, no rhonchi Abdominal: Soft, NT, ND, bowel sounds + Extremities: no edema, no cyanosis    The results of significant diagnostics from this hospitalization (including imaging, microbiology, ancillary and laboratory) are listed below for reference.     Microbiology: Recent Results (from the past 240 hour(s))  Urine culture  Status: Abnormal   Collection Time: 08/20/17  9:40 PM  Result Value Ref Range Status   Specimen Description   Final    URINE, CATHETERIZED Performed at [facility], [address], [place], [place] [address]    Special Requests   Final    NONE Performed at [facility], [address], [place], [place] [address]    Culture MULTIPLE SPECIES PRESENT, SUGGEST RECOLLECTION (A)  Final   Report Status 08/22/2017 FINAL  Final     Labs: BNP (last 3 results) No results for input(s): BNP in the last 8760 hours. Basic Metabolic Panel: Recent Labs  Lab 08/20/17 2055 08/21/17 0617 08/22/17 0621 08/23/17 0607  NA 143 146* 148* 141  K 4.7 4.7 4.2 4.0  CL 112* 115* 118* 113*  CO2 18* 18* 19* 20*  GLUCOSE 108* 89 112* 142*  BUN 46* 45* 37* 29*  CREATININE 1.82* 1.65* 1.39* 1.25*  CALCIUM 9.1 8.8*  8.7* 8.4*   Liver Function Tests: Recent Labs  Lab 08/20/17 2055 08/21/17 0617  AST 22 22  ALT 14 12*  ALKPHOS 71 62  BILITOT 0.8 0.9  PROT 6.5 6.2*  ALBUMIN 3.4* 3.2*   No results for input(s): LIPASE, AMYLASE in the last 168 hours. Recent Labs  Lab 08/20/17 2056  AMMONIA 11   CBC: Recent Labs  Lab 08/20/17 2055 08/21/17 0617 08/22/17 0621  WBC 10.5 9.1 7.6  NEUTROABS 8.4*  --   --   HGB 12.4 12.1 11.8*  HCT 39.8 38.8 38.9  MCV 92.3 92.8 93.1  PLT 366 255 332   Cardiac Enzymes: No results for input(s): CKTOTAL, CKMB, CKMBINDEX, TROPONINI in the last 168 hours. BNP: Invalid input(s): POCBNP CBG: Recent Labs  Lab 08/20/17 2051  GLUCAP 103*   D-Dimer No results for input(s): DDIMER in the last 72 hours. Hgb A1c No results for input(s): HGBA1C in the last 72 hours. Lipid Profile No results for input(s): CHOL, HDL, LDLCALC, TRIG, CHOLHDL, LDLDIRECT in the last 72 hours. Thyroid function studies Recent Labs    08/20/17 2056  TSH 3.263   Anemia work up No results for input(s): VITAMINB12, FOLATE, FERRITIN, TIBC, IRON, RETICCTPCT in the last 72 hours. Urinalysis    Component Value Date/Time   COLORURINE YELLOW 08/20/2017 2140   APPEARANCEUR CLOUDY (A) 08/20/2017 2140   LABSPEC 1.013 08/20/2017 2140   PHURINE 5.0 08/20/2017 2140   GLUCOSEU NEGATIVE 08/20/2017 2140   HGBUR SMALL (A) 08/20/2017 2140   BILIRUBINUR NEGATIVE 08/20/2017 2140   KETONESUR 5 (A) 08/20/2017 2140   PROTEINUR 30 (A) 08/20/2017 2140   UROBILINOGEN 0.2 07/03/2014 0315   NITRITE POSITIVE (A) 08/20/2017 2140   LEUKOCYTESUR TRACE (A) 08/20/2017 2140   Sepsis Labs Invalid input(s): PROCALCITONIN,  WBC,  LACTICIDVEN Microbiology Recent Results (from the past 240 hour(s))  Urine culture     Status: Abnormal   Collection Time: 08/20/17  9:40 PM  Result Value Ref Range Status   Specimen Description   Final    URINE, CATHETERIZED Performed at [facility], [address],  [place], [place] [address]    Special Requests   Final    NONE Performed at [facility], [address] [place], [place] [address]    Culture MULTIPLE SPECIES PRESENT, SUGGEST RECOLLECTION (A)  Final   Report Status 08/22/2017 FINAL  Final     Time coordinating discharge: Over 30 minutes  SIGNED:   Erick Blinks, DO [facility] 08/23/2017, 12:23 PM Pager [phone]  If 7PM-7AM, please  contact night-coverage [email or web address] Password [email or web address]

## 2017-08-23 NOTE — Progress Notes (Addendum)
Physical Therapy Treatment Patient Details Name: [name] MRN: [id number] DOB: 1920/10/08 Today's Date: 08/23/2017    History of Present Illness [name]  is a 84 y.o. female, with h/o dementia, hypertension, prior hip fracture, hypothyroidism, vertebral compression fracture was brought to the hospital for lethargy, poor po intake, altered mental status. Patient is usually significantly debilitated, walks with a walker. Over the past few days she has had poor po intake.  As per son there has been no history of vomiting or diarrhea.  Patient did not complain of chest pain or shortness of breath.    PT Comments    Patient presents with her sons at bedside, required Max verbal/tactile cueing to participate, c/o severe pain with pressure to right distal foot during ROM exercises, swelling noted on distal dorsal surface of foot and 1-3 toes swollen - RN notified.  Patient tolerated sitting up at bedside and required active assistance to complete LAQ's x 10 reps each, did not attempt to stand patient due to right foot pain.  Patient will benefit from continued physical therapy in hospital and recommended venue below to increase strength, balance, endurance for safe ADLs and gait.    Follow Up Recommendations  SNF     Equipment Recommendations  None recommended by PT    Recommendations for Other Services       Precautions / Restrictions Precautions Precautions: Fall Restrictions Weight Bearing Restrictions: No    Mobility  Bed Mobility Overal bed mobility: Needs Assistance Bed Mobility: Supine to Sit;Sit to Supine     Supine to sit: Max assist Sit to supine: Max assist      Transfers                    Ambulation/Gait                 Stairs            Wheelchair Mobility    Modified Rankin (Stroke Patients Only)       Balance Overall balance assessment: Needs assistance Sitting-balance support: Feet supported;No upper extremity  supported Sitting balance-Leahy Scale: Good                                      Cognition Arousal/Alertness: Awake/alert Behavior During Therapy: Restless;Agitated Overall Cognitive Status: Impaired/Different from baseline Area of Impairment: Following commands                       Following Commands: Follows one step commands inconsistently;Follows one step commands with increased time       General Comments: requires repeated verbal/tactile cueing to participate, more cooperative with family assisting      Exercises Other Exercises Other Exercises: active assisted ROM to BLE all planes of motion x 10 reps each    General Comments        Pertinent Vitals/Pain Pain Assessment: Faces Faces Pain Scale: Hurts whole lot Pain Location: right distal foot Pain Descriptors / Indicators: Sharp;Discomfort;Grimacing;Guarding;Tender Pain Intervention(s): Limited activity within patient's tolerance;Monitored during session;Patient requesting pain meds-RN notified    Home Living                      Prior Function            PT Goals (current goals can now be found in the care plan section) Acute Rehab PT Goals Patient  Stated Goal: return home Time For Goal Achievement: 09/05/17 Potential to Achieve Goals: Good Progress towards PT goals: Progressing toward goals    Frequency    Min 3X/week      PT Plan Current plan remains appropriate    Co-evaluation              AM-PAC PT "6 Clicks" Daily Activity  Outcome Measure  Difficulty turning over in bed (including adjusting bedclothes, sheets and blankets)?: A Lot Difficulty moving from lying on back to sitting on the side of the bed? : A Lot Difficulty sitting down on and standing up from a chair with arms (e.g., wheelchair, bedside commode, etc,.)?: A Lot Help needed moving to and from a bed to chair (including a wheelchair)?: Total Help needed walking in hospital room?:  Total Help needed climbing 3-5 steps with a railing? : Total 6 Click Score: 9    End of Session   Activity Tolerance: Patient tolerated treatment well;Patient limited by pain Patient left: in bed;with call bell/phone within reach;with bed alarm set;with family/visitor present Nurse Communication: Mobility status PT Visit Diagnosis: Unsteadiness on feet (R26.81);Other abnormalities of gait and mobility (R26.89);Muscle weakness (generalized) (M62.81)     Time: [id number] PT Time Calculation (min) (ACUTE ONLY): 30 min  Charges:  $Therapeutic Exercise: 8-22 mins $Therapeutic Activity: 8-22 mins                    G Codes:       1:36 PM, 08/23/17 Ocie BobJames Danai Gotto, MPT Physical Therapist with [facility] [phone] office [phone] mobile phone

## 2017-08-23 NOTE — Progress Notes (Signed)
IV discontinued,catheter intact.[facility],report called,and [facility] to [facility]. Family at bedside. Transported by via [facility] to awaiting facility.

## 2017-09-11 DEATH — deceased

## 2017-10-21 IMAGING — DX DG HIP (WITH OR WITHOUT PELVIS) 1V*L*
2 series · 2 of 2 positions shown · non-contrast
Comparison: None.

CLINICAL DATA: Status post fall, with left hip pain. Initial
encounter.

EXAM:
DG HIP (WITH OR WITHOUT PELVIS) 1V*L*

[t pelvis ap]
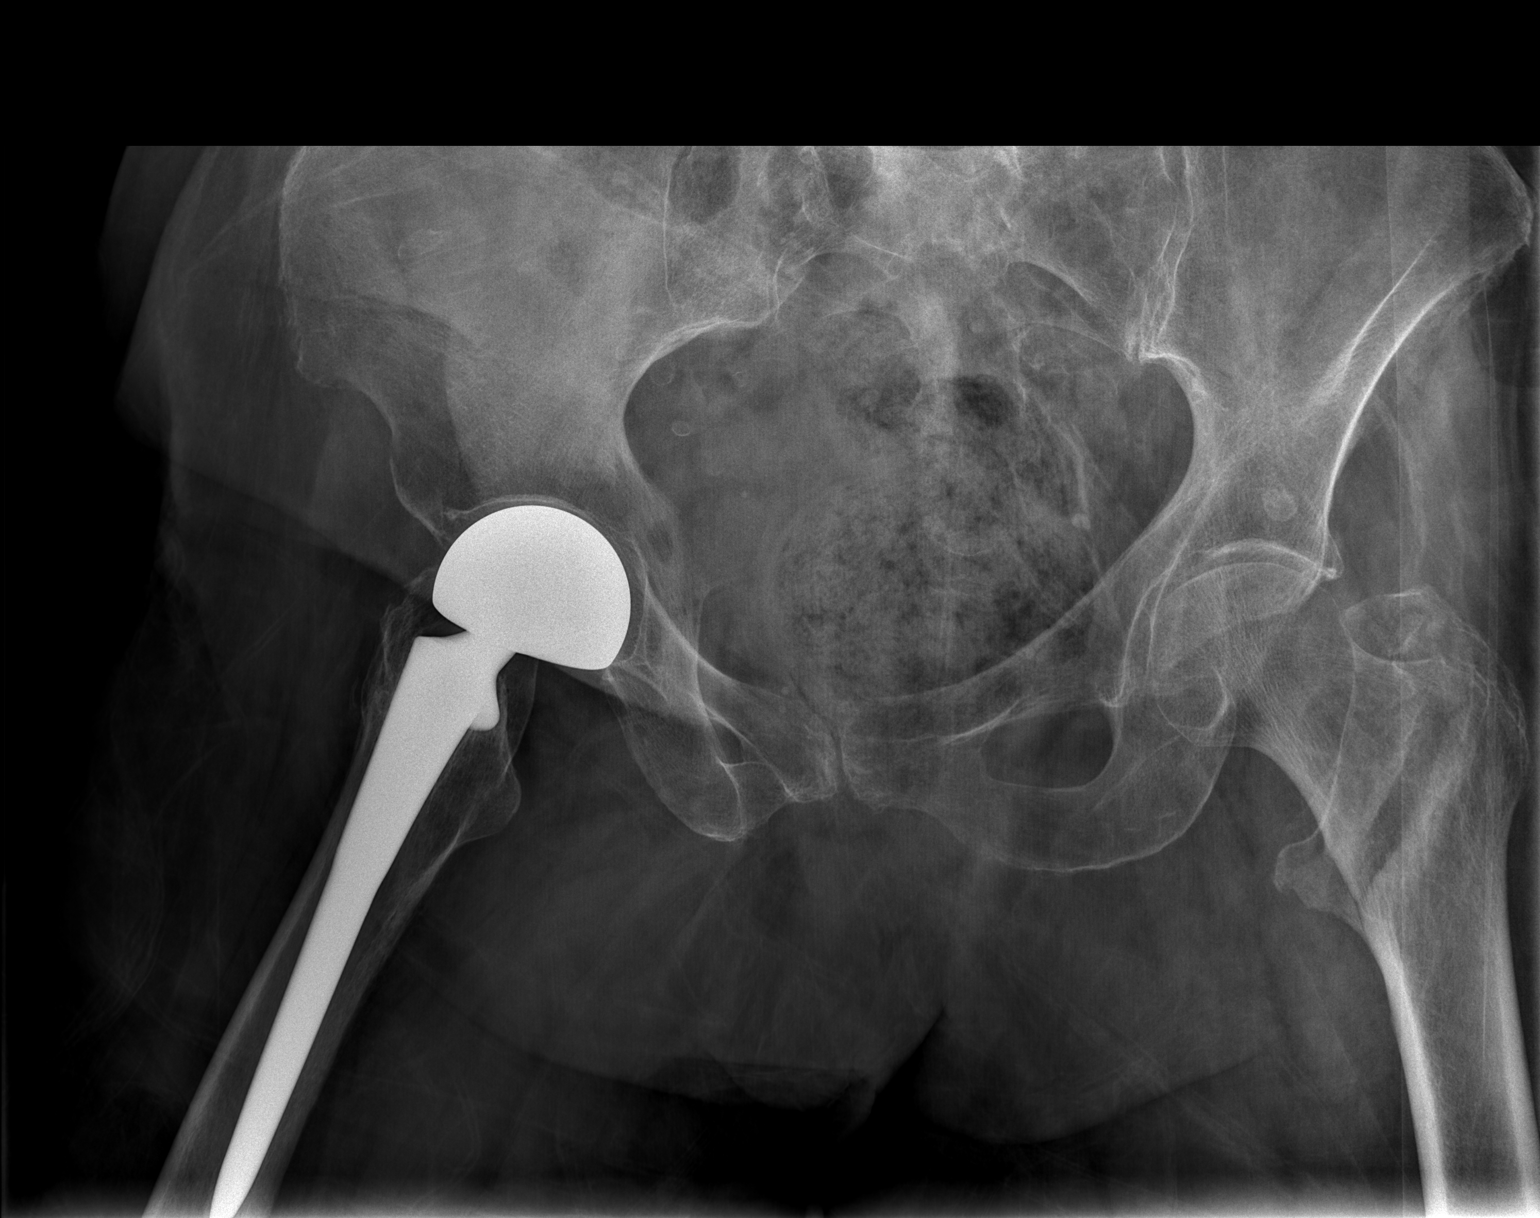

[t hip ap left]
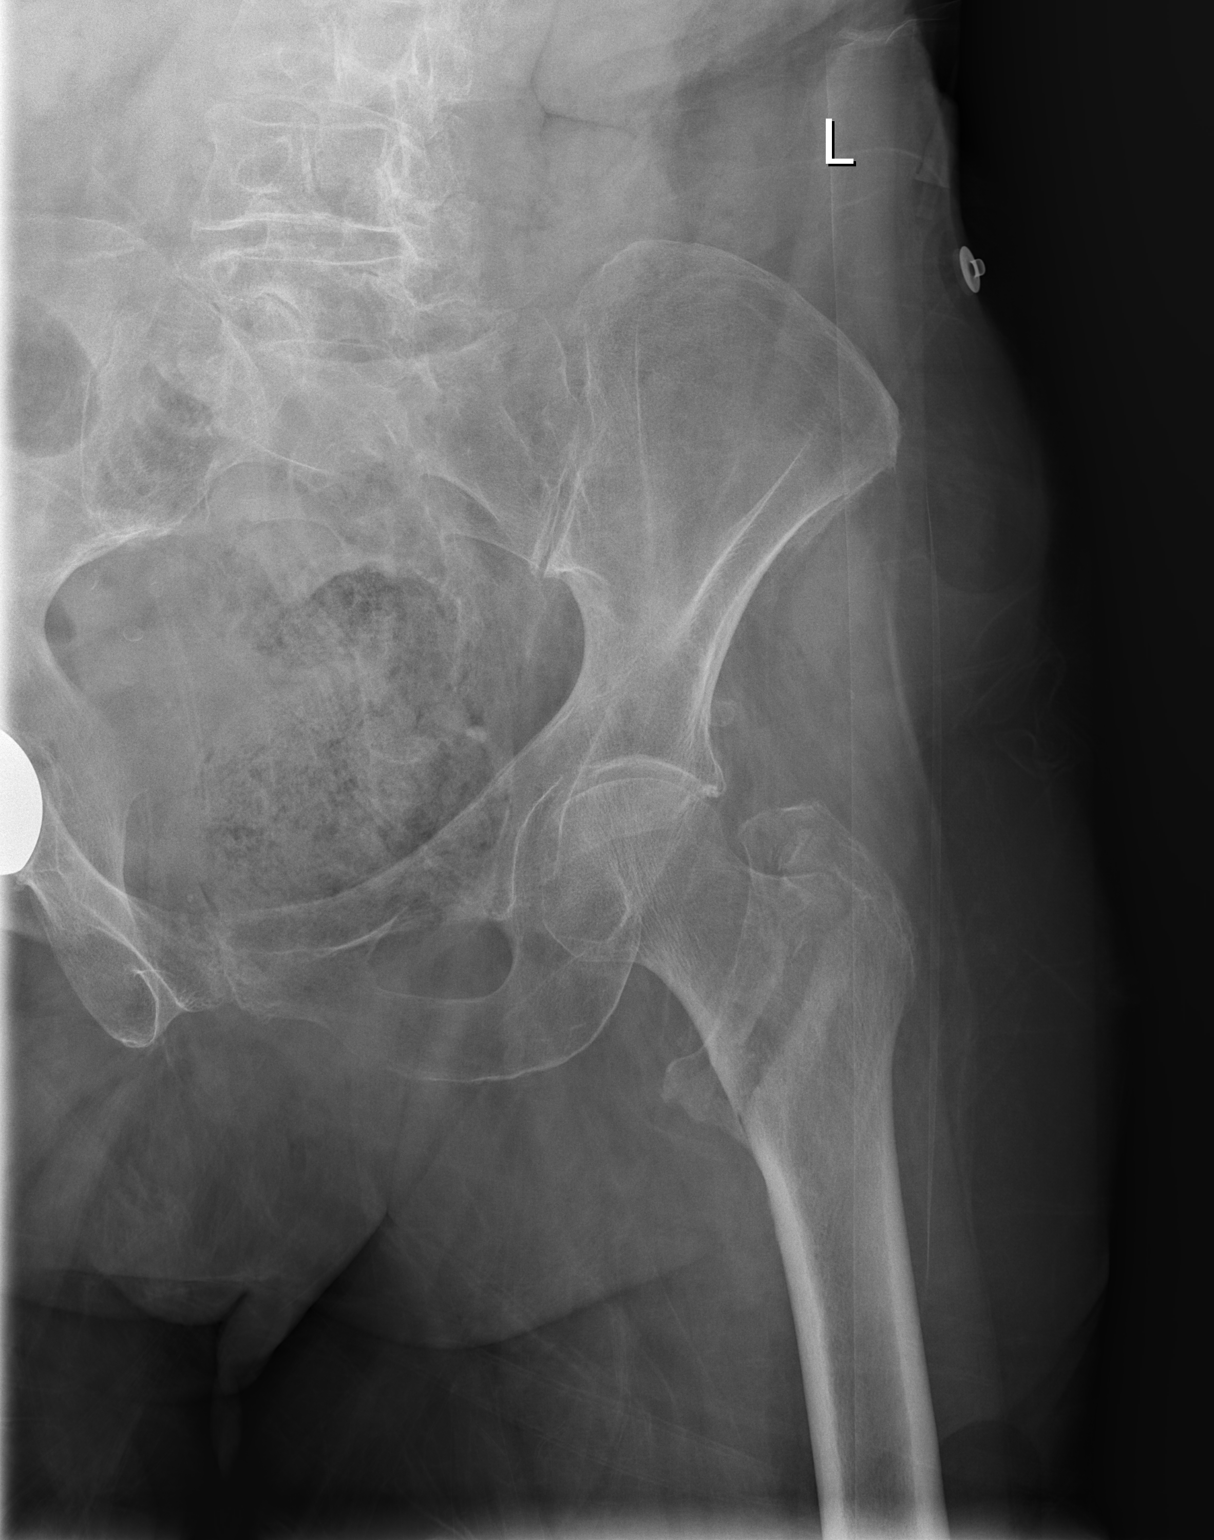

[2 of 2 positions shown; findings below may reference images not displayed]

FINDINGS: There is a mildly displaced intertrochanteric fracture of the
proximal left femur. The left femoral head remains seated at the
acetabulum. The patient's right femoral prosthesis is grossly
unremarkable in appearance. Degenerative change is noted at the
lower lumbar spine.

The visualized bowel gas pattern is grossly unremarkable.
IMPRESSION: Mildly displaced intertrochanteric fracture of the proximal left
femur.
